# Patient Record
Sex: Female | Born: 1975 | Race: Black or African American | Hispanic: No | Marital: Married | Smoking: Former smoker
Health system: Southern US, Community
[De-identification: ages and names within clinical notes are randomized; demographics above are authoritative.]

## PROBLEM LIST (undated history)

## (undated) DIAGNOSIS — G5600 Carpal tunnel syndrome, unspecified upper limb: Secondary | ICD-10-CM

## (undated) DIAGNOSIS — J45909 Unspecified asthma, uncomplicated: Secondary | ICD-10-CM

## (undated) DIAGNOSIS — M4800 Spinal stenosis, site unspecified: Secondary | ICD-10-CM

## (undated) DIAGNOSIS — M1990 Unspecified osteoarthritis, unspecified site: Secondary | ICD-10-CM

## (undated) HISTORY — PX: TUBAL LIGATION: SHX77

## (undated) HISTORY — DX: Morbid (severe) obesity due to excess calories: E66.01

---

## 2002-05-24 ENCOUNTER — Emergency Department (HOSPITAL_COMMUNITY): Admission: EM | Admit: 2002-05-24 | Discharge: 2002-05-24 | Payer: Self-pay | Admitting: Emergency Medicine

## 2002-05-24 ENCOUNTER — Encounter: Payer: Self-pay | Admitting: Emergency Medicine

## 2002-06-26 ENCOUNTER — Emergency Department (HOSPITAL_COMMUNITY): Admission: EM | Admit: 2002-06-26 | Discharge: 2002-06-26 | Payer: Self-pay | Admitting: Emergency Medicine

## 2002-11-05 ENCOUNTER — Emergency Department (HOSPITAL_COMMUNITY): Admission: EM | Admit: 2002-11-05 | Discharge: 2002-11-05 | Payer: Self-pay | Admitting: Emergency Medicine

## 2003-01-22 ENCOUNTER — Encounter: Admission: RE | Admit: 2003-01-22 | Discharge: 2003-01-22 | Payer: Self-pay | Admitting: Sports Medicine

## 2003-01-29 ENCOUNTER — Encounter: Admission: RE | Admit: 2003-01-29 | Discharge: 2003-01-29 | Payer: Self-pay | Admitting: Sports Medicine

## 2003-02-05 ENCOUNTER — Encounter: Admission: RE | Admit: 2003-02-05 | Discharge: 2003-02-05 | Payer: Self-pay | Admitting: Sports Medicine

## 2003-05-11 ENCOUNTER — Emergency Department (HOSPITAL_COMMUNITY): Admission: EM | Admit: 2003-05-11 | Discharge: 2003-05-12 | Payer: Self-pay | Admitting: Emergency Medicine

## 2003-05-14 ENCOUNTER — Encounter: Admission: RE | Admit: 2003-05-14 | Discharge: 2003-05-14 | Payer: Self-pay | Admitting: Sports Medicine

## 2003-06-03 ENCOUNTER — Encounter: Admission: RE | Admit: 2003-06-03 | Discharge: 2003-06-03 | Payer: Self-pay | Admitting: Family Medicine

## 2003-06-29 ENCOUNTER — Encounter: Admission: RE | Admit: 2003-06-29 | Discharge: 2003-06-29 | Payer: Self-pay | Admitting: Sports Medicine

## 2004-01-04 ENCOUNTER — Emergency Department (HOSPITAL_COMMUNITY): Admission: EM | Admit: 2004-01-04 | Discharge: 2004-01-04 | Payer: Self-pay | Admitting: Emergency Medicine

## 2004-01-15 ENCOUNTER — Emergency Department (HOSPITAL_COMMUNITY): Admission: EM | Admit: 2004-01-15 | Discharge: 2004-01-15 | Payer: Self-pay | Admitting: Emergency Medicine

## 2004-01-29 ENCOUNTER — Ambulatory Visit: Payer: Self-pay | Admitting: Family Medicine

## 2004-03-15 ENCOUNTER — Ambulatory Visit: Payer: Self-pay | Admitting: Family Medicine

## 2004-03-24 ENCOUNTER — Ambulatory Visit: Payer: Self-pay | Admitting: Family Medicine

## 2004-04-01 ENCOUNTER — Ambulatory Visit: Payer: Self-pay | Admitting: Family Medicine

## 2004-04-21 ENCOUNTER — Ambulatory Visit: Payer: Self-pay | Admitting: Family Medicine

## 2004-04-28 ENCOUNTER — Ambulatory Visit: Payer: Self-pay | Admitting: Family Medicine

## 2004-05-22 ENCOUNTER — Emergency Department (HOSPITAL_COMMUNITY): Admission: EM | Admit: 2004-05-22 | Discharge: 2004-05-22 | Payer: Self-pay | Admitting: Family Medicine

## 2004-06-20 ENCOUNTER — Emergency Department (HOSPITAL_COMMUNITY): Admission: EM | Admit: 2004-06-20 | Discharge: 2004-06-20 | Payer: Self-pay | Admitting: Family Medicine

## 2004-06-21 ENCOUNTER — Emergency Department (HOSPITAL_COMMUNITY): Admission: EM | Admit: 2004-06-21 | Discharge: 2004-06-21 | Payer: Self-pay | Admitting: Family Medicine

## 2004-09-13 ENCOUNTER — Emergency Department (HOSPITAL_COMMUNITY): Admission: EM | Admit: 2004-09-13 | Discharge: 2004-09-13 | Payer: Self-pay | Admitting: Emergency Medicine

## 2004-09-24 ENCOUNTER — Emergency Department (HOSPITAL_COMMUNITY): Admission: EM | Admit: 2004-09-24 | Discharge: 2004-09-24 | Payer: Self-pay | Admitting: Emergency Medicine

## 2004-11-17 ENCOUNTER — Ambulatory Visit: Payer: Self-pay | Admitting: Family Medicine

## 2005-01-20 ENCOUNTER — Emergency Department (HOSPITAL_COMMUNITY): Admission: EM | Admit: 2005-01-20 | Discharge: 2005-01-20 | Payer: Self-pay | Admitting: Emergency Medicine

## 2005-03-28 ENCOUNTER — Ambulatory Visit: Payer: Self-pay | Admitting: Family Medicine

## 2005-05-14 ENCOUNTER — Emergency Department (HOSPITAL_COMMUNITY): Admission: EM | Admit: 2005-05-14 | Discharge: 2005-05-14 | Payer: Self-pay | Admitting: Emergency Medicine

## 2005-05-31 ENCOUNTER — Ambulatory Visit: Payer: Self-pay | Admitting: Family Medicine

## 2005-06-19 ENCOUNTER — Ambulatory Visit: Payer: Self-pay | Admitting: Sports Medicine

## 2005-06-26 ENCOUNTER — Ambulatory Visit: Payer: Self-pay | Admitting: Family Medicine

## 2005-09-01 ENCOUNTER — Emergency Department (HOSPITAL_COMMUNITY): Admission: EM | Admit: 2005-09-01 | Discharge: 2005-09-02 | Payer: Self-pay | Admitting: Emergency Medicine

## 2005-10-06 ENCOUNTER — Ambulatory Visit: Payer: Self-pay | Admitting: Family Medicine

## 2005-10-27 ENCOUNTER — Encounter (INDEPENDENT_AMBULATORY_CARE_PROVIDER_SITE_OTHER): Payer: Self-pay | Admitting: *Deleted

## 2005-10-27 LAB — CONVERTED CEMR LAB

## 2005-11-02 ENCOUNTER — Ambulatory Visit: Payer: Self-pay | Admitting: Family Medicine

## 2005-11-08 ENCOUNTER — Other Ambulatory Visit: Admission: RE | Admit: 2005-11-08 | Discharge: 2005-11-08 | Payer: Self-pay | Admitting: Family Medicine

## 2005-11-08 ENCOUNTER — Ambulatory Visit: Payer: Self-pay | Admitting: Family Medicine

## 2005-12-18 ENCOUNTER — Ambulatory Visit: Payer: Self-pay | Admitting: Sports Medicine

## 2006-04-19 DIAGNOSIS — E669 Obesity, unspecified: Secondary | ICD-10-CM

## 2006-04-19 DIAGNOSIS — K625 Hemorrhage of anus and rectum: Secondary | ICD-10-CM

## 2006-04-19 DIAGNOSIS — D259 Leiomyoma of uterus, unspecified: Secondary | ICD-10-CM | POA: Insufficient documentation

## 2006-04-20 ENCOUNTER — Encounter (INDEPENDENT_AMBULATORY_CARE_PROVIDER_SITE_OTHER): Payer: Self-pay | Admitting: *Deleted

## 2006-06-08 ENCOUNTER — Emergency Department (HOSPITAL_COMMUNITY): Admission: EM | Admit: 2006-06-08 | Discharge: 2006-06-09 | Payer: Self-pay | Admitting: Emergency Medicine

## 2010-03-13 ENCOUNTER — Encounter: Payer: Self-pay | Admitting: Sports Medicine

## 2017-03-07 ENCOUNTER — Emergency Department (HOSPITAL_COMMUNITY)
Admission: EM | Admit: 2017-03-07 | Discharge: 2017-03-07 | Disposition: A | Discharge status: Home / Self Care | Payer: Medicaid Other | Attending: Emergency Medicine | Admitting: Emergency Medicine

## 2017-03-07 ENCOUNTER — Encounter (HOSPITAL_COMMUNITY): Payer: Self-pay

## 2017-03-07 ENCOUNTER — Other Ambulatory Visit: Payer: Self-pay

## 2017-03-07 ENCOUNTER — Emergency Department (HOSPITAL_COMMUNITY): Payer: Medicaid Other

## 2017-03-07 DIAGNOSIS — Z91040 Latex allergy status: Secondary | ICD-10-CM | POA: Insufficient documentation

## 2017-03-07 DIAGNOSIS — Z79899 Other long term (current) drug therapy: Secondary | ICD-10-CM | POA: Diagnosis not present

## 2017-03-07 DIAGNOSIS — R300 Dysuria: Secondary | ICD-10-CM | POA: Diagnosis present

## 2017-03-07 DIAGNOSIS — N739 Female pelvic inflammatory disease, unspecified: Secondary | ICD-10-CM | POA: Diagnosis not present

## 2017-03-07 DIAGNOSIS — N730 Acute parametritis and pelvic cellulitis: Secondary | ICD-10-CM

## 2017-03-07 HISTORY — DX: Unspecified osteoarthritis, unspecified site: M19.90

## 2017-03-07 HISTORY — DX: Carpal tunnel syndrome, unspecified upper limb: G56.00

## 2017-03-07 LAB — COMPREHENSIVE METABOLIC PANEL
ALBUMIN: 3.5 g/dL (ref 3.5–5.0)
ALT: 15 U/L (ref 14–54)
AST: 21 U/L (ref 15–41)
Alkaline Phosphatase: 48 U/L (ref 38–126)
Anion gap: 9 (ref 5–15)
BILIRUBIN TOTAL: 0.5 mg/dL (ref 0.3–1.2)
BUN: 7 mg/dL (ref 6–20)
CHLORIDE: 105 mmol/L (ref 101–111)
CO2: 26 mmol/L (ref 22–32)
CREATININE: 0.76 mg/dL (ref 0.44–1.00)
Calcium: 8.8 mg/dL — ABNORMAL LOW (ref 8.9–10.3)
GFR calc Af Amer: >60 mL/min (ref >60)
GLUCOSE: 104 mg/dL — AB (ref 65–99)
POTASSIUM: 3.9 mmol/L (ref 3.5–5.1)
Sodium: 140 mmol/L (ref 135–145)
TOTAL PROTEIN: 6.7 g/dL (ref 6.5–8.1)

## 2017-03-07 LAB — URINALYSIS, ROUTINE W REFLEX MICROSCOPIC
Bilirubin Urine: NEGATIVE
Glucose, UA: NEGATIVE mg/dL
Ketones, ur: NEGATIVE mg/dL
Leukocytes, UA: NEGATIVE
Nitrite: NEGATIVE
Protein, ur: NEGATIVE mg/dL
Specific Gravity, Urine: 1.003 — ABNORMAL LOW (ref 1.005–1.030)
pH: 6 (ref 5.0–8.0)

## 2017-03-07 LAB — CBC WITH DIFFERENTIAL/PLATELET
Basophils Absolute: 0 10*3/uL (ref 0.0–0.1)
Basophils Relative: 0 %
Eosinophils Absolute: 0.5 10*3/uL (ref 0.0–0.7)
Eosinophils Relative: 5 %
HCT: 31.7 % — ABNORMAL LOW (ref 36.0–46.0)
Hemoglobin: 9.9 g/dL — ABNORMAL LOW (ref 12.0–15.0)
Lymphocytes Relative: 40 %
Lymphs Abs: 3.4 10*3/uL (ref 0.7–4.0)
MCH: 25.2 pg — ABNORMAL LOW (ref 26.0–34.0)
MCHC: 31.2 g/dL (ref 30.0–36.0)
MCV: 80.7 fL (ref 78.0–100.0)
Monocytes Absolute: 0.6 10*3/uL (ref 0.1–1.0)
Monocytes Relative: 7 %
Neutro Abs: 4 10*3/uL (ref 1.7–7.7)
Neutrophils Relative %: 48 %
Platelets: 470 10*3/uL — ABNORMAL HIGH (ref 150–400)
RBC: 3.93 MIL/uL (ref 3.87–5.11)
RDW: 15.2 % (ref 11.5–15.5)
WBC: 8.5 10*3/uL (ref 4.0–10.5)

## 2017-03-07 LAB — WET PREP, GENITAL
Clue Cells Wet Prep HPF POC: NONE SEEN
Sperm: NONE SEEN
Trich, Wet Prep: NONE SEEN
Yeast Wet Prep HPF POC: NONE SEEN

## 2017-03-07 LAB — GC/CHLAMYDIA PROBE AMP (~~LOC~~) NOT AT ARMC
Chlamydia: NEGATIVE
Neisseria Gonorrhea: NEGATIVE

## 2017-03-07 MED ORDER — OXYCODONE-ACETAMINOPHEN 5-325 MG PO TABS
2.0000 | ORAL_TABLET | Freq: Once | ORAL | Status: AC
  Administered 2017-03-07: 2 via ORAL
  Filled 2017-03-07: qty 2

## 2017-03-07 MED ORDER — DOXYCYCLINE HYCLATE 100 MG PO CAPS: 100.0000 mg | ORAL_CAPSULE | Freq: Two times a day (BID) | ORAL | 0 refills | Status: DC

## 2017-03-07 MED ORDER — DIPHENHYDRAMINE HCL 50 MG/ML IJ SOLN
25.0000 mg | Freq: Once | INTRAMUSCULAR | Status: AC
  Administered 2017-03-07: 25 mg via INTRAVENOUS
  Filled 2017-03-07: qty 1

## 2017-03-07 MED ORDER — IOPAMIDOL (ISOVUE-300) INJECTION 61%
INTRAVENOUS | Status: AC
  Administered 2017-03-07: 100 mL
  Filled 2017-03-07: qty 100

## 2017-03-07 MED ORDER — MORPHINE SULFATE (PF) 4 MG/ML IV SOLN
4.0000 mg | Freq: Once | INTRAVENOUS | Status: AC
  Administered 2017-03-07: 4 mg via INTRAVENOUS
  Filled 2017-03-07: qty 1

## 2017-03-07 MED ORDER — CEFTRIAXONE SODIUM 1 G IJ SOLR
1.0000 g | INTRAMUSCULAR | Status: DC
  Administered 2017-03-07: 1 g via INTRAVENOUS
  Filled 2017-03-07: qty 10

## 2017-03-07 MED ORDER — OXYCODONE-ACETAMINOPHEN 5-325 MG PO TABS: 1.0000 | ORAL_TABLET | ORAL | 0 refills | Status: DC | PRN

## 2017-03-07 MED ORDER — OXYCODONE-ACETAMINOPHEN 5-325 MG PO TABS
1.0000 | ORAL_TABLET | Freq: Once | ORAL | Status: AC
  Administered 2017-03-07: 1 via ORAL
  Filled 2017-03-07: qty 1

## 2017-03-07 MED ORDER — SODIUM CHLORIDE 0.9 % IV SOLN
INTRAVENOUS | Status: DC
  Administered 2017-03-07: 10:00:00 via INTRAVENOUS

## 2017-03-07 NOTE — ED Provider Notes (Signed)
[facility] EMERGENCY DEPARTMENT Provider Note   CSN: [id number] Arrival date & time: 03/07/17  0431     History   Chief Complaint Chief Complaint  Patient presents with  . Recurrent UTI    HPI Michele Trevino is a 42 y.o. female.  42 year old female presents with dysuria times several days.  Recently treated for UTI and just finished her antibiotics.  Denies any fever, flank pain, vomiting.  Some nausea.  Some vaginal discharge without bleeding.  Denies any vaginal bleeding.  Symptoms persistent and may swell worse with urination.  Nothing makes them better      Past Medical History:  Diagnosis Date  . Arthritis   . Carpal tunnel syndrome     Patient Active Problem List   Diagnosis Date Noted  . 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 OBESITY, NOS 04/19/2006  . BLEEDING, RECTAL 04/19/2006    Past Surgical History:  Procedure Laterality Date  . CESAREAN SECTION      OB History    No data available       Home Medications    Prior to Admission medications   Medication Sig Start Date End Date Taking? Authorizing Provider  albuterol (PROVENTIL HFA;VENTOLIN HFA) 108 (90 Base) MCG/ACT inhaler Inhale 1-2 puffs into the lungs every 6 (six) hours as needed for wheezing or shortness of breath.   Yes [provider]    Family History No family history on file.  Social History Social History   Tobacco Use  . Smoking status: Never Smoker  . Smokeless tobacco: Never Used  Substance Use Topics  . Alcohol use: No    Frequency: Never  . Drug use: No     Allergies   Latex   Review of Systems Review of Systems  All other systems reviewed and are negative.    Physical Exam Updated Vital Signs BP 128/62   Pulse 73   Temp 97.9 F (36.6 C) (Oral)   Resp 20   Ht 1.626 m (5\' 4" )   Wt 90.3 kg (199 lb)   LMP 03/01/2017   SpO2 100%   BMI 34.16 kg/m   Physical Exam  Constitutional: She is oriented to person, place, and time. She  appears well-developed and well-nourished.  Non-toxic appearance. No distress.  HENT:  Head: Normocephalic and atraumatic.  Eyes: Conjunctivae, EOM and lids are normal. Pupils are equal, round, and reactive to light.  Neck: Normal range of motion. Neck supple. No tracheal deviation present. No thyroid mass present.  Cardiovascular: Normal rate, regular rhythm and normal heart sounds. Exam reveals no gallop.  No murmur heard. Pulmonary/Chest: Effort normal and breath sounds normal. No stridor. No respiratory distress. She has no decreased breath sounds. She has no wheezes. She has no rhonchi. She has no rales.  Abdominal: Soft. Normal appearance and bowel sounds are normal. She exhibits no distension. There is no tenderness. There is no rebound and no CVA tenderness.  Genitourinary: No erythema or bleeding in the vagina. No signs of injury around the vagina. Vaginal discharge found.  Musculoskeletal: Normal range of motion. She exhibits no edema or tenderness.  Neurological: She is alert and oriented to person, place, and time. She has normal strength. No cranial nerve deficit or sensory deficit. GCS eye subscore is 4. GCS verbal subscore is 5. GCS motor subscore is 6.  Skin: Skin is warm and dry. No abrasion and no rash noted.  Psychiatric: She has a normal mood and affect. Her speech is normal and  behavior is normal.  Nursing note and vitals reviewed.    ED Treatments / Results  Labs (all labs ordered are listed, but only abnormal results are displayed) Labs Reviewed  URINALYSIS, ROUTINE W REFLEX MICROSCOPIC - Abnormal; Notable for the following components:      Result Value   Color, Urine STRAW (*)    Specific Gravity, Urine 1.003 (*)    Hgb urine dipstick SMALL (*)    Bacteria, UA RARE (*)    Squamous Epithelial / LPF 0-5 (*)    All other components within normal limits  WET PREP, GENITAL  GC/CHLAMYDIA PROBE AMP ([place]) NOT AT [facility]    EKG  EKG Interpretation None        Radiology No results found.  Procedures Procedures (including critical care time)  Medications Ordered in ED Medications - No data to display   Initial Impression / Assessment and Plan / ED Course  I have reviewed the triage vital signs and the nursing notes.  Pertinent labs & imaging results that were available during my care of the patient were reviewed by me and considered in my medical decision making (see chart for details).     Abdominal CT negative for intra-abdominal abscess.  Suspect patient may have PID and will be treated for that.  Final Clinical Impressions(s) / ED Diagnoses   Final diagnoses:  None    ED Discharge Orders    None       Lacretia Leigh, MD 03/07/17 1340

## 2017-03-07 NOTE — ED Notes (Signed)
Patient c/o lower back pain states she was dx. With UTI on 1/2 and just finished her antibiotics this past Sat. States she is still having pain and burning with urination.

## 2017-03-07 NOTE — ED Notes (Signed)
Patient transported to CT 

## 2017-03-07 NOTE — ED Triage Notes (Signed)
Pt states that she was recently treated for UTi, finished antibiotics on Sat, pain has not went away, denies fevers.

## 2017-03-07 NOTE — ED Notes (Signed)
CT informed that pt has had a tubal ligation and we will not be performing a urine pregnancy test.  Informed them pt is ready for CT.  Pt will sign statement that she is not pregnant.

## 2017-05-13 ENCOUNTER — Other Ambulatory Visit: Payer: Self-pay

## 2017-05-13 ENCOUNTER — Emergency Department (HOSPITAL_COMMUNITY)
Admission: EM | Admit: 2017-05-13 | Discharge: 2017-05-14 | Disposition: A | Discharge status: Home / Self Care | Payer: Medicaid Other | Attending: Emergency Medicine | Admitting: Emergency Medicine

## 2017-05-13 ENCOUNTER — Encounter (HOSPITAL_COMMUNITY): Payer: Self-pay

## 2017-05-13 DIAGNOSIS — R350 Frequency of micturition: Secondary | ICD-10-CM | POA: Diagnosis present

## 2017-05-13 DIAGNOSIS — N898 Other specified noninflammatory disorders of vagina: Secondary | ICD-10-CM

## 2017-05-13 DIAGNOSIS — N3001 Acute cystitis with hematuria: Secondary | ICD-10-CM | POA: Insufficient documentation

## 2017-05-13 LAB — URINALYSIS, ROUTINE W REFLEX MICROSCOPIC
BILIRUBIN URINE: NEGATIVE
Glucose, UA: NEGATIVE mg/dL
Ketones, ur: NEGATIVE mg/dL
NITRITE: NEGATIVE
PROTEIN: NEGATIVE mg/dL
Specific Gravity, Urine: 1.024 (ref 1.005–1.030)
pH: 5 (ref 5.0–8.0)

## 2017-05-13 LAB — BASIC METABOLIC PANEL
ANION GAP: 7 (ref 5–15)
BUN: 14 mg/dL (ref 6–20)
CALCIUM: 8.8 mg/dL — AB (ref 8.9–10.3)
CHLORIDE: 107 mmol/L (ref 101–111)
CO2: 25 mmol/L (ref 22–32)
Creatinine, Ser: 0.8 mg/dL (ref 0.44–1.00)
GFR calc non Af Amer: >60 mL/min (ref >60)
Glucose, Bld: 105 mg/dL — ABNORMAL HIGH (ref 65–99)
Potassium: 3.8 mmol/L (ref 3.5–5.1)
Sodium: 139 mmol/L (ref 135–145)

## 2017-05-13 LAB — CBC WITH DIFFERENTIAL/PLATELET
BASOS PCT: 0 %
Basophils Absolute: 0 10*3/uL (ref 0.0–0.1)
Eosinophils Absolute: 0.4 10*3/uL (ref 0.0–0.7)
Eosinophils Relative: 3 %
HEMATOCRIT: 33.1 % — AB (ref 36.0–46.0)
HEMOGLOBIN: 10.5 g/dL — AB (ref 12.0–15.0)
Lymphocytes Relative: 37 %
Lymphs Abs: 4 10*3/uL (ref 0.7–4.0)
MCH: 26.4 pg (ref 26.0–34.0)
MCHC: 31.7 g/dL (ref 30.0–36.0)
MCV: 83.2 fL (ref 78.0–100.0)
MONOS PCT: 6 %
Monocytes Absolute: 0.6 10*3/uL (ref 0.1–1.0)
NEUTROS ABS: 5.8 10*3/uL (ref 1.7–7.7)
NEUTROS PCT: 54 %
Platelets: 429 10*3/uL — ABNORMAL HIGH (ref 150–400)
RBC: 3.98 MIL/uL (ref 3.87–5.11)
RDW: 16 % — ABNORMAL HIGH (ref 11.5–15.5)
WBC: 10.8 10*3/uL — ABNORMAL HIGH (ref 4.0–10.5)

## 2017-05-13 LAB — PREGNANCY, URINE: PREG TEST UR: NEGATIVE

## 2017-05-13 LAB — WET PREP, GENITAL
Clue Cells Wet Prep HPF POC: NONE SEEN
Sperm: NONE SEEN
Trich, Wet Prep: NONE SEEN

## 2017-05-13 MED ORDER — FENTANYL CITRATE (PF) 100 MCG/2ML IJ SOLN
100.0000 ug | Freq: Once | INTRAMUSCULAR | Status: AC
  Administered 2017-05-13: 100 ug via INTRAVENOUS
  Filled 2017-05-13: qty 2

## 2017-05-13 NOTE — ED Notes (Signed)
Pelvic cart at bedside. 

## 2017-05-13 NOTE — ED Provider Notes (Signed)
[facility] DEPT Provider Note   CSN: [id number] Arrival date & time: 05/13/17  1954     History   Chief Complaint Chief Complaint  Patient presents with  . Urinary Frequency    HPI Michele Trevino is a 42 y.o. female who presents for evaluation of 4 days of increased urinary frequency, dysuria, hematuria.  Patient also reports that today she started having swelling.  Patient reports that when symptoms began 4 days ago, she took a leftover antibiotic that she had from a previous UTI.  Patient states that improved the symptoms of her urinary discomfort.  Patient reports a day, she started noticing some vaginal irritation, felt like she is having some clitoral swelling. She states that she felt as if tehre was erythema, swelling, and warmth to the clitoris. Patient reports she was last currently sexually active 6 days ago.  She states that they do not use any protection or any other medication.  Patient denies any fever, difficulty tolerating p.o., nausea/vomiting, abdominal pain, vaginal bleeding.  The history is provided by the patient.    Past Medical History:  Diagnosis Date  . Arthritis   . Carpal tunnel syndrome     Patient Active Problem List   Diagnosis Date Noted  . 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 OBESITY, NOS 04/19/2006  . BLEEDING, RECTAL 04/19/2006    Past Surgical History:  Procedure Laterality Date  . CESAREAN SECTION       OB History   None      Home Medications    Prior to Admission medications   Medication Sig Start Date End Date Taking? Authorizing Provider  albuterol (PROVENTIL HFA;VENTOLIN HFA) 108 (90 Base) MCG/ACT inhaler Inhale 1-2 puffs into the lungs every 6 (six) hours as needed for wheezing or shortness of breath.   Yes [provider]  amoxicillin-clavulanate (AUGMENTIN) 875-125 MG tablet Take 1 tablet by mouth 2 (two) times daily.   Yes [provider]  miconazole (MONISTAT 1  COMBINATION PACK) kit Place 1 each vaginally once.   Yes [provider]  cephALEXin (KEFLEX) 500 MG capsule Take 1 capsule (500 mg total) by mouth 4 (four) times daily. 05/14/17   Volanda Napoleon, PA-C  doxycycline (VIBRAMYCIN) 100 MG capsule Take 1 capsule (100 mg total) by mouth 2 (two) times daily for 7 days. 05/14/17 05/21/17  Volanda Napoleon, PA-C  fluconazole (DIFLUCAN) 150 MG tablet Take 1 tablet (150 mg total) by mouth daily for 1 day. 05/14/17 05/15/17  Volanda Napoleon, PA-C  HYDROcodone-acetaminophen (NORCO/VICODIN) 5-325 MG tablet Take 1-2 tablets by mouth every 6 (six) hours as needed. 05/14/17   Volanda Napoleon, PA-C  oxyCODONE-acetaminophen (PERCOCET/ROXICET) 5-325 MG tablet Take 1-2 tablets by mouth every 4 (four) hours as needed for severe pain. Patient not taking: Reported on 05/14/2017 03/07/17   Lacretia Leigh, MD    Family History History reviewed. No pertinent family history.  Social History Social History   Tobacco Use  . Smoking status: Never Smoker  . Smokeless tobacco: Never Used  Substance Use Topics  . Alcohol use: No    Frequency: Never  . Drug use: No     Allergies   Latex   Review of Systems Review of Systems  Constitutional: Negative for fever.  Respiratory: Negative for cough and shortness of breath.   Cardiovascular: Negative for chest pain.  Gastrointestinal: Negative for abdominal pain, nausea and vomiting.  Genitourinary: Positive for dysuria, frequency, hematuria and vaginal pain. Negative for vaginal  bleeding.     Physical Exam Updated Vital Signs BP 114/71 (BP Location: Right Arm)   Pulse 72   Temp 98.8 F (37.1 C) (Oral)   Resp 17   Ht _0  (1.549 m)   Wt 90.3 kg (199 lb)   LMP 04/29/2017 Comment: negative urine oregnancy test 05/13/17  SpO2 99%   BMI 37.60 kg/m   Physical Exam  Constitutional: She is oriented to person, place, and time. She appears well-developed and well-nourished.  HENT:  Head: Normocephalic  and atraumatic.  Mouth/Throat: Oropharynx is clear and moist and mucous membranes are normal.  Eyes: Pupils are equal, round, and reactive to light. Conjunctivae, EOM and lids are normal.  Neck: Full passive range of motion without pain.  Cardiovascular: Normal rate, regular rhythm, normal heart sounds and normal pulses. Exam reveals no gallop and no friction rub.  No murmur heard. Pulmonary/Chest: Effort normal and breath sounds normal.  Abdominal: Soft. Normal appearance. There is no tenderness. There is no rigidity, no guarding and no CVA tenderness.  Abdomen is soft, non-distended. Non-tender. No CVA tenderness bilaterally  Genitourinary: Vagina normal and uterus normal. Cervix exhibits discharge. Cervix exhibits no motion tenderness and no friability. Right adnexum displays no mass and no tenderness. Left adnexum displays no mass and no tenderness.  Genitourinary Comments: The exam was performed with a chaperone present. Normal external female genitalia. No lesions, rash, or sores. Patient had pain with palpation of the clitoris. No warmth, erythema. No evidence of bartholin's abscess. Patient with significant pain on the pelvic exam. There was bloody/purulent drainage noted at the cervix. Cervix is slightly erythematous. On manual exam, patient was able to tolerate pelvic exam after initial entry but does report significant pain. No true CMT. No adnexal mass, tenderness bilaterally. Patient seems to have the most pain to the superficial clitoris and labia minora.   Musculoskeletal: Normal range of motion.  Neurological: She is alert and oriented to person, place, and time.  Skin: Skin is warm and dry. Capillary refill takes less than 2 seconds.  Psychiatric: She has a normal mood and affect. Her speech is normal.  Nursing note and vitals reviewed.    ED Treatments / Results  Labs (all labs ordered are listed, but only abnormal results are displayed) Labs Reviewed  WET PREP, GENITAL -  Abnormal; Notable for the following components:      Result Value   Yeast Wet Prep HPF POC PRESENT (*)    WBC, Wet Prep HPF POC MODERATE (*)    All other components within normal limits  URINALYSIS, ROUTINE W REFLEX MICROSCOPIC - Abnormal; Notable for the following components:   APPearance HAZY (*)    Hgb urine dipstick SMALL (*)    Leukocytes, UA LARGE (*)    Bacteria, UA RARE (*)    Squamous Epithelial / LPF 0-5 (*)    All other components within normal limits  BASIC METABOLIC PANEL - Abnormal; Notable for the following components:   Glucose, Bld 105 (*)    Calcium 8.8 (*)    All other components within normal limits  CBC WITH DIFFERENTIAL/PLATELET - Abnormal; Notable for the following components:   WBC 10.8 (*)    Hemoglobin 10.5 (*)    HCT 33.1 (*)    RDW 16.0 (*)    Platelets 429 (*)    All other components within normal limits  PREGNANCY, URINE  GC/CHLAMYDIA PROBE AMP ([place]) NOT AT [facility]    EKG None  Radiology Ct Abdomen Pelvis  W Contrast  Result Date: 05/14/2017 CLINICAL DATA:  42 year old female with dysuria. EXAM: CT ABDOMEN AND PELVIS WITH CONTRAST TECHNIQUE: Multidetector CT imaging of the abdomen and pelvis was performed using the standard protocol following bolus administration of intravenous contrast. CONTRAST:  139m ISOVUE-300 IOPAMIDOL (ISOVUE-300) INJECTION 61% COMPARISON:  CT of the abdomen pelvis dated 03/07/2017. FINDINGS: Lower chest: Minimal focal area of probable atelectasis/scarring at the right lung base. The visualized lung bases are otherwise clear. No intra-abdominal free air or free fluid. Hepatobiliary: No focal liver abnormality is seen. No gallstones, gallbladder wall thickening, or biliary dilatation. Pancreas: Unremarkable. No pancreatic ductal dilatation or surrounding inflammatory changes. Spleen: Normal in size without focal abnormality. Adrenals/Urinary Tract: Adrenal glands are unremarkable. Kidneys are normal, without renal calculi,  focal lesion, or hydronephrosis. Bladder is unremarkable. Stomach/Bowel: Stomach is within normal limits. Appendix appears normal. No evidence of bowel wall thickening, distention, or inflammatory changes. Vascular/Lymphatic: No significant vascular findings are present. No enlarged abdominal or pelvic lymph nodes. Reproductive: The uterus is anteverted and retroflexed. There is abutment of the anterior uterus to the anterior pelvic wall consistent with adhesions. The ovaries are grossly unremarkable as visualized. Other: Anterior pelvic wall C-section scar. Musculoskeletal: No acute or significant osseous findings. IMPRESSION: 1. No acute intra-abdominal or pelvic pathology. 2. Postsurgical changes of prior C-section with adhesion of the uterus to the anterior pelvic wall. Electronically Signed   By: AAnner CreteM.D.   On: 05/14/2017 01:04    Procedures Procedures (including critical care time)  Medications Ordered in ED Medications  iopamidol (ISOVUE-300) 61 % injection (has no administration in time range)  sodium chloride 0.9 % injection (has no administration in time range)  fentaNYL (SUBLIMAZE) injection 100 mcg (100 mcg Intravenous Given 05/13/17 2333)  iopamidol (ISOVUE-300) 61 % injection 100 mL (100 mLs Intravenous Contrast Given 05/14/17 0024)  HYDROcodone-acetaminophen (NORCO/VICODIN) 5-325 MG per tablet 1 tablet (1 tablet Oral Given 05/14/17 0132)  cefTRIAXone (ROCEPHIN) injection 250 mg (250 mg Intramuscular Given 05/14/17 0133)  lidocaine (XYLOCAINE) 1 % (with pres) injection (0.9 mLs  Given 05/14/17 0134)     Initial Impression / Assessment and Plan / ED Course  I have reviewed the triage vital signs and the nursing notes.  Pertinent labs & imaging results that were available during my care of the patient were reviewed by me and considered in my medical decision making (see chart for details).     42y.o. F who presents for evaluation of urinary complaints and vaginal  discharge. Initially started with urinary complaints 4 days ago. Today had some vaginal irritation and reports pain, swelling to clitoris. No vaginal bleeding, vaginal discharge. No abdominal pain, nausea/vomiting. Patient is afebrile, non-toxic appearing, sitting comfortably on examination table. Vital signs reviewed and stable. Exam shows normal abdominal exam. No CVA tenderness. On GU exam, patient has not rashes or lesions. Patient with significant TTP to the clitoris. No warmth, swelling,e erythema. No evidence of clitoral abscess. No evidence of bartholin's abscess. Patient with some bloody/purulent discharge on GU exam. Cervix is slightly erythematous but no friability. No adnexal mass/tenderness. Patient states she is not concerned about STD exposure as she is currently only sexually active with here husband. Given significant pain and discharge, will plan for CT abd/pelvis for evaluation of possible intra-abdominal abscess that is tracking. Analgesics provided in the department.   BMP unremarkable. CBC shows leukocytosis, slight anemia. Wet prep shows yeast present. Urine pregnancy negative. UA shows large leukocytes, TNTC WBC. CT abd/pelv shows no  acute abnormalities.   Discussed results with patient. She reports improvement in pain after analgesics. Will plan to provide abx for UTI. Additionally, given conerns of significant pelvic pain, will go ahead and treat. Instructed patient to follow up with OB/GYN. Patient had ample opportunity for questions and discussion. All patient's questions were answered with full understanding. Strict return precautions discussed. Patient expresses understanding and agreement to plan.    Final Clinical Impressions(s) / ED Diagnoses   Final diagnoses:  Acute cystitis with hematuria  Vaginal irritation  Vaginal discharge    ED Discharge Orders        Ordered    doxycycline (VIBRAMYCIN) 100 MG capsule  2 times daily     05/14/17 0120    cephALEXin (KEFLEX)  500 MG capsule  4 times daily     05/14/17 0120    HYDROcodone-acetaminophen (NORCO/VICODIN) 5-325 MG tablet  Every 6 hours PRN     05/14/17 0120    fluconazole (DIFLUCAN) 150 MG tablet  Daily     05/14/17 0120       Volanda Napoleon, PA-C 05/14/17 0356    Lacretia Leigh, MD 05/14/17 1701

## 2017-05-13 NOTE — ED Triage Notes (Signed)
Pt arrives today c/o urinary frequency, urgency, and hematuria starting 4 days ago. Pt began taking a left over abx  from a previous infection.  Vaginal itching started yesterday, used a monistat for yeast infection, but began to experience clitoral swelling upon waking today. Reports some nausea.

## 2017-05-14 ENCOUNTER — Emergency Department (HOSPITAL_COMMUNITY): Payer: Medicaid Other

## 2017-05-14 ENCOUNTER — Encounter (HOSPITAL_COMMUNITY): Payer: Self-pay

## 2017-05-14 LAB — GC/CHLAMYDIA PROBE AMP (~~LOC~~) NOT AT ARMC
CHLAMYDIA, DNA PROBE: NEGATIVE
NEISSERIA GONORRHEA: NEGATIVE

## 2017-05-14 MED ORDER — IOPAMIDOL (ISOVUE-300) INJECTION 61%
INTRAVENOUS | Status: AC
  Filled 2017-05-14: qty 100

## 2017-05-14 MED ORDER — FLUCONAZOLE 150 MG PO TABS: 150.0000 mg | ORAL_TABLET | Freq: Every day | ORAL | 0 refills | Status: AC

## 2017-05-14 MED ORDER — CEFTRIAXONE SODIUM 250 MG IJ SOLR
250.0000 mg | Freq: Once | INTRAMUSCULAR | Status: AC
  Administered 2017-05-14: 250 mg via INTRAMUSCULAR
  Filled 2017-05-14: qty 250

## 2017-05-14 MED ORDER — LIDOCAINE HCL 1 % IJ SOLN
INTRAMUSCULAR | Status: AC
  Administered 2017-05-14: 0.9 mL
  Filled 2017-05-14: qty 20

## 2017-05-14 MED ORDER — HYDROCODONE-ACETAMINOPHEN 5-325 MG PO TABS
1.0000 | ORAL_TABLET | Freq: Once | ORAL | Status: AC
  Administered 2017-05-14: 1 via ORAL
  Filled 2017-05-14: qty 1

## 2017-05-14 MED ORDER — CEPHALEXIN 500 MG PO CAPS: 500.0000 mg | ORAL_CAPSULE | Freq: Four times a day (QID) | ORAL | 0 refills | Status: DC

## 2017-05-14 MED ORDER — DOXYCYCLINE HYCLATE 100 MG PO CAPS: 100.0000 mg | ORAL_CAPSULE | Freq: Two times a day (BID) | ORAL | 0 refills | Status: AC

## 2017-05-14 MED ORDER — IOPAMIDOL (ISOVUE-300) INJECTION 61%
100.0000 mL | Freq: Once | INTRAVENOUS | Status: AC | PRN
  Administered 2017-05-14: 100 mL via INTRAVENOUS

## 2017-05-14 MED ORDER — DOXYCYCLINE HYCLATE 100 MG PO TABS: 100.0000 mg | ORAL_TABLET | Freq: Once | ORAL | Status: DC

## 2017-05-14 MED ORDER — SODIUM CHLORIDE 0.9 % IJ SOLN
INTRAMUSCULAR | Status: AC
  Filled 2017-05-14: qty 50

## 2017-05-14 MED ORDER — CEPHALEXIN 500 MG PO CAPS: 500.0000 mg | ORAL_CAPSULE | Freq: Once | ORAL | Status: DC

## 2017-05-14 MED ORDER — HYDROCODONE-ACETAMINOPHEN 5-325 MG PO TABS: 1.0000 | ORAL_TABLET | Freq: Four times a day (QID) | ORAL | 0 refills | Status: DC | PRN

## 2017-05-14 NOTE — Discharge Instructions (Addendum)
Take antibiotics as directed. Please take all of your antibiotics until finished.  You can take Tylenol or Ibuprofen as directed for pain. You can alternate Tylenol and Ibuprofen every 4 hours. If you take Tylenol at 1pm, then you can take Ibuprofen at 5pm. Then you can take Tylenol again at 9pm. You can take the pain   The test results from the pelvic exam will take 2-3 days to return. If there is an abnormal result, you will be notified. If you do not hear anything, that means the results were negative. You can also log on MyChart to see the results.    Do not have sexual intercourse until you finish the antibiotics.   As we discussed, you need to follow-up with referred [facility] for further evaluation.  Return to the emergency department for any fever, worsening pain, vaginal discharge, vaginal bleeding, abdominal pain, difficulty eating or any other worsening or concerning symptoms.

## 2017-06-28 ENCOUNTER — Other Ambulatory Visit (HOSPITAL_COMMUNITY)
Admission: RE | Admit: 2017-06-28 | Discharge: 2017-06-28 | Disposition: A | Discharge status: Home / Self Care | Payer: Medicaid Other | Source: Ambulatory Visit | Attending: Obstetrics | Admitting: Obstetrics

## 2017-06-28 ENCOUNTER — Encounter: Payer: Self-pay | Admitting: Obstetrics

## 2017-06-28 ENCOUNTER — Ambulatory Visit (INDEPENDENT_AMBULATORY_CARE_PROVIDER_SITE_OTHER): Payer: Medicaid Other | Admitting: Obstetrics

## 2017-06-28 VITALS — BP 146/97 | HR 73 | Temp 98.2°F | Ht 61.0 in | Wt 201.8 lb

## 2017-06-28 DIAGNOSIS — R87810 Cervical high risk human papillomavirus (HPV) DNA test positive: Secondary | ICD-10-CM | POA: Insufficient documentation

## 2017-06-28 DIAGNOSIS — Z124 Encounter for screening for malignant neoplasm of cervix: Secondary | ICD-10-CM | POA: Insufficient documentation

## 2017-06-28 DIAGNOSIS — Z1239 Encounter for other screening for malignant neoplasm of breast: Secondary | ICD-10-CM

## 2017-06-28 DIAGNOSIS — N760 Acute vaginitis: Secondary | ICD-10-CM | POA: Insufficient documentation

## 2017-06-28 DIAGNOSIS — N390 Urinary tract infection, site not specified: Secondary | ICD-10-CM

## 2017-06-28 DIAGNOSIS — B9689 Other specified bacterial agents as the cause of diseases classified elsewhere: Secondary | ICD-10-CM | POA: Insufficient documentation

## 2017-06-28 DIAGNOSIS — Z01419 Encounter for gynecological examination (general) (routine) without abnormal findings: Secondary | ICD-10-CM

## 2017-06-28 DIAGNOSIS — Z0000 Encounter for general adult medical examination without abnormal findings: Secondary | ICD-10-CM

## 2017-06-28 DIAGNOSIS — N898 Other specified noninflammatory disorders of vagina: Secondary | ICD-10-CM | POA: Diagnosis present

## 2017-06-28 LAB — POCT URINALYSIS DIPSTICK
BILIRUBIN UA: NEGATIVE
GLUCOSE UA: NEGATIVE
Ketones, UA: NEGATIVE
Nitrite, UA: NEGATIVE
Protein, UA: NEGATIVE
SPEC GRAV UA: 1.01 (ref 1.010–1.025)
Urobilinogen, UA: 2 E.U./dL — AB
pH, UA: 7.5 (ref 5.0–8.0)

## 2017-06-28 MED ORDER — NITROFURANTOIN MONOHYD MACRO 100 MG PO CAPS: 100.0000 mg | ORAL_CAPSULE | Freq: Two times a day (BID) | ORAL | 5 refills | Status: DC

## 2017-06-28 NOTE — Progress Notes (Signed)
Subjective:        Michele Trevino is a 42 y.o. female here for a routine exam.  Current complaints: Frequent UTI's since marriage in January 2019.  UTI's seem to occur after intercourse.    Personal health questionnaire:  Is patient Ashkenazi Jewish, have a family history of breast and/or ovarian cancer: no Is there a family history of uterine cancer diagnosed at age < 68, gastrointestinal cancer, urinary tract cancer, family member who is a Field seismologist syndrome-associated carrier: no Is the patient overweight and hypertensive, family history of diabetes, personal history of gestational diabetes, preeclampsia or PCOS: no Is patient over 15, have PCOS,  family history of premature CHD under age 9, diabetes, smoke, have hypertension or peripheral artery disease:  no At any time, has a partner hit, kicked or otherwise hurt or frightened you?: no Over the past 2 weeks, have you felt down, depressed or hopeless?: no Over the past 2 weeks, have you felt little interest or pleasure in doing things?:no   Gynecologic History Patient's last menstrual period was 06/12/2017 (exact date). Contraception: Tubal Ligation Last Pap: unknown. Results were: normal Last mammogram: unknown. Results were: unknown  Obstetric History OB History  Gravida Para Term Preterm AB Living  4         4  SAB TAB Ectopic Multiple Live Births               # Outcome Date GA Lbr Len/2nd Weight Sex Delivery Anes PTL Lv  4 Gravida           3 Gravida           2 Gravida           1 [place]             Past Medical History:  Diagnosis Date  . Arthritis   . Carpal tunnel syndrome     Past Surgical History:  Procedure Laterality Date  . CESAREAN SECTION       Current Outpatient Medications:  .  albuterol (PROVENTIL HFA;VENTOLIN HFA) 108 (90 Base) MCG/ACT inhaler, Inhale 1-2 puffs into the lungs every 6 (six) hours as needed for wheezing or shortness of breath., Disp: , Rfl:  .  amoxicillin-clavulanate  (AUGMENTIN) 875-125 MG tablet, Take 1 tablet by mouth 2 (two) times daily., Disp: , Rfl:  .  cephALEXin (KEFLEX) 500 MG capsule, Take 1 capsule (500 mg total) by mouth 4 (four) times daily. (Patient not taking: Reported on 06/28/2017), Disp: 28 capsule, Rfl: 0 .  HYDROcodone-acetaminophen (NORCO/VICODIN) 5-325 MG tablet, Take 1-2 tablets by mouth every 6 (six) hours as needed. (Patient not taking: Reported on 06/28/2017), Disp: 8 tablet, Rfl: 0 .  miconazole (MONISTAT 1 COMBINATION PACK) kit, Place 1 each vaginally once., Disp: , Rfl:  .  nitrofurantoin, macrocrystal-monohydrate, (MACROBID) 100 MG capsule, Take 1 capsule (100 mg total) by mouth 2 (two) times daily., Disp: 14 capsule, Rfl: 5 .  oxyCODONE-acetaminophen (PERCOCET/ROXICET) 5-325 MG tablet, Take 1-2 tablets by mouth every 4 (four) hours as needed for severe pain. (Patient not taking: Reported on 05/14/2017), Disp: 15 tablet, Rfl: 0 Allergies  Allergen Reactions  . Latex Swelling    Social History   Tobacco Use  . Smoking status: Former Research scientist (life sciences)  . Smokeless tobacco: Never Used  Substance Use Topics  . Alcohol use: Yes    Frequency: Never    Comment: socially     Family History  Problem Relation Age of Onset  . Throat cancer Mother   .  Diabetes Father   . Hypertension Father       Review of Systems  Constitutional: negative for fatigue and weight loss Respiratory: negative for cough and wheezing Cardiovascular: negative for chest pain, fatigue and palpitations Gastrointestinal: negative for abdominal pain and change in bowel habits Musculoskeletal:negative for myalgias Neurological: negative for gait problems and tremors Behavioral/Psych: negative for abusive relationship, depression Endocrine: negative for temperature intolerance    Genitourinary:negative for abnormal menstrual periods, genital lesions, hot flashes, sexual problems and vaginal discharge Integument/breast: negative for breast lump, breast tenderness, nipple  discharge and skin lesion(s)    Objective:       BP (!) 146/97   Pulse 73   Temp 98.2 F (36.8 C) (Oral)   Ht 5' 1" (1.549 m)   Wt 201 lb 12.8 oz (91.5 kg)   LMP 06/12/2017 (Exact Date)   BMI 38.13 kg/m  General:   alert  Skin:   no rash or abnormalities  Lungs:   clear to auscultation bilaterally  Heart:   regular rate and rhythm, S1, S2 normal, no murmur, click, rub or gallop  Breasts:   normal without suspicious masses, skin or nipple changes or axillary nodes  Abdomen:  normal findings: no organomegaly, soft, non-tender and no hernia  Pelvis:  External genitalia: normal general appearance Urinary system: urethral meatus normal and bladder without fullness, nontender Vaginal: normal without tenderness, induration or masses Cervix: normal appearance Adnexa: normal bimanual exam Uterus: anteverted and non-tender, normal size   Lab Review Urine pregnancy test Labs reviewed yes Radiologic studies reviewed yes  50% of 20 min visit spent on counseling and coordination of care.   Assessment and Plan:   1. Women's annual routine gynecological examination  2. Screening for cervical cancer Rx: - Cytology - PAP  3. Vaginal discharge Rx: - Cervicovaginal ancillary only  4. Frequent UTI Rx: - Urine Culture - POCT Urinalysis Dipstick - nitrofurantoin, macrocrystal-monohydrate, (MACROBID) 100 MG capsule; Take 1 capsule (100 mg total) by mouth 2 (two) times daily.  Dispense: 14 capsule; Refill: 5  5. Screening breast examination Rx: - MM Digital Screening; Future    Plan:    Education reviewed: calcium supplements, depression evaluation, low fat, low cholesterol diet, safe sex/STD prevention, self breast exams and weight bearing exercise. Mammogram ordered. Follow up in: 3 months.   Meds ordered this encounter  Medications  . nitrofurantoin, macrocrystal-monohydrate, (MACROBID) 100 MG capsule    Sig: Take 1 capsule (100 mg total) by mouth 2 (two) times daily.     Dispense:  14 capsule    Refill:  5   Orders Placed This Encounter  Procedures  . Urine Culture  . MM Digital Screening    Standing Status:   Future    Standing Expiration Date:   08/29/2018    Order Specific Question:   Reason for Exam (SYMPTOM  OR DIAGNOSIS REQUIRED)    Answer:   Screening    Order Specific Question:   Is the patient pregnant?    Answer:   No    Order Specific Question:   Preferred imaging location?    Answer:   [facility]  . POCT Urinalysis Dipstick    CHARLES A. HARPER MD 06-28-2017    

## 2017-06-28 NOTE — Progress Notes (Signed)
NGYN patient presents for Annual Exam.  CC: Frequent UTI's, pt states she is not in pain but has discomfort.   LMP:06/12/2017 BTL +HPV last year per pt  Mammogram: ?

## 2017-06-29 LAB — CERVICOVAGINAL ANCILLARY ONLY
Bacterial vaginitis: POSITIVE — AB
CHLAMYDIA, DNA PROBE: NEGATIVE
Candida vaginitis: NEGATIVE
Neisseria Gonorrhea: NEGATIVE
Trichomonas: NEGATIVE

## 2017-06-30 ENCOUNTER — Other Ambulatory Visit: Payer: Self-pay | Admitting: Obstetrics

## 2017-06-30 DIAGNOSIS — N760 Acute vaginitis: Principal | ICD-10-CM

## 2017-06-30 DIAGNOSIS — B9689 Other specified bacterial agents as the cause of diseases classified elsewhere: Secondary | ICD-10-CM

## 2017-06-30 LAB — URINE CULTURE

## 2017-06-30 MED ORDER — SECNIDAZOLE 2 G PO PACK: 1.0000 | PACK | Freq: Once | ORAL | 2 refills | Status: AC

## 2017-07-03 ENCOUNTER — Other Ambulatory Visit: Payer: Self-pay

## 2017-07-03 DIAGNOSIS — B9689 Other specified bacterial agents as the cause of diseases classified elsewhere: Secondary | ICD-10-CM

## 2017-07-03 DIAGNOSIS — N760 Acute vaginitis: Principal | ICD-10-CM

## 2017-07-03 LAB — CYTOLOGY - PAP
DIAGNOSIS: NEGATIVE
HPV 16/18/45 genotyping: NEGATIVE
HPV: DETECTED — AB

## 2017-07-03 MED ORDER — METRONIDAZOLE 0.75 % VA GEL: 1.0000 | Freq: Every day | VAGINAL | 1 refills | Status: DC

## 2017-10-07 ENCOUNTER — Emergency Department (HOSPITAL_COMMUNITY): Payer: Self-pay

## 2017-10-07 ENCOUNTER — Emergency Department (HOSPITAL_COMMUNITY)
Admission: EM | Admit: 2017-10-07 | Discharge: 2017-10-07 | Disposition: A | Discharge status: Home / Self Care | Payer: Self-pay | Attending: Emergency Medicine | Admitting: Emergency Medicine

## 2017-10-07 ENCOUNTER — Encounter (HOSPITAL_COMMUNITY): Payer: Self-pay | Admitting: Emergency Medicine

## 2017-10-07 ENCOUNTER — Other Ambulatory Visit: Payer: Self-pay

## 2017-10-07 DIAGNOSIS — Z79899 Other long term (current) drug therapy: Secondary | ICD-10-CM | POA: Insufficient documentation

## 2017-10-07 DIAGNOSIS — Z91040 Latex allergy status: Secondary | ICD-10-CM | POA: Insufficient documentation

## 2017-10-07 DIAGNOSIS — R079 Chest pain, unspecified: Secondary | ICD-10-CM

## 2017-10-07 DIAGNOSIS — Z87891 Personal history of nicotine dependence: Secondary | ICD-10-CM | POA: Insufficient documentation

## 2017-10-07 DIAGNOSIS — R51 Headache: Secondary | ICD-10-CM | POA: Insufficient documentation

## 2017-10-07 DIAGNOSIS — R072 Precordial pain: Secondary | ICD-10-CM | POA: Insufficient documentation

## 2017-10-07 LAB — BASIC METABOLIC PANEL
Anion gap: 5 (ref 5–15)
BUN: 8 mg/dL (ref 6–20)
CHLORIDE: 108 mmol/L (ref 98–111)
CO2: 26 mmol/L (ref 22–32)
Calcium: 8.5 mg/dL — ABNORMAL LOW (ref 8.9–10.3)
Creatinine, Ser: 0.82 mg/dL (ref 0.44–1.00)
Glucose, Bld: 102 mg/dL — ABNORMAL HIGH (ref 70–99)
POTASSIUM: 4.2 mmol/L (ref 3.5–5.1)
SODIUM: 139 mmol/L (ref 135–145)

## 2017-10-07 LAB — I-STAT TROPONIN, ED
Troponin i, poc: 0 ng/mL (ref 0.00–0.08)
Troponin i, poc: 0 ng/mL (ref 0.00–0.08)

## 2017-10-07 LAB — CBC
HEMATOCRIT: 34.2 % — AB (ref 36.0–46.0)
Hemoglobin: 10.1 g/dL — ABNORMAL LOW (ref 12.0–15.0)
MCH: 24.8 pg — ABNORMAL LOW (ref 26.0–34.0)
MCHC: 29.5 g/dL — ABNORMAL LOW (ref 30.0–36.0)
MCV: 83.8 fL (ref 78.0–100.0)
PLATELETS: 444 10*3/uL — AB (ref 150–400)
RBC: 4.08 MIL/uL (ref 3.87–5.11)
RDW: 15.2 % (ref 11.5–15.5)
WBC: 8.1 10*3/uL (ref 4.0–10.5)

## 2017-10-07 LAB — I-STAT BETA HCG BLOOD, ED (MC, WL, AP ONLY)

## 2017-10-07 LAB — D-DIMER, QUANTITATIVE: D-Dimer, Quant: 0.37 ug/mL-FEU (ref 0.00–0.50)

## 2017-10-07 MED ORDER — KETOROLAC TROMETHAMINE 15 MG/ML IJ SOLN
15.0000 mg | Freq: Once | INTRAMUSCULAR | Status: AC
  Administered 2017-10-07: 15 mg via INTRAVENOUS
  Filled 2017-10-07: qty 1

## 2017-10-07 MED ORDER — SUCRALFATE 1 G PO TABS
1.0000 g | ORAL_TABLET | Freq: Once | ORAL | Status: AC
  Administered 2017-10-07: 1 g via ORAL
  Filled 2017-10-07: qty 1

## 2017-10-07 MED ORDER — METHOCARBAMOL 500 MG PO TABS: 500.0000 mg | ORAL_TABLET | Freq: Two times a day (BID) | ORAL | 0 refills | Status: DC

## 2017-10-07 MED ORDER — IBUPROFEN 600 MG PO TABS: 600.0000 mg | ORAL_TABLET | Freq: Four times a day (QID) | ORAL | 0 refills | Status: DC | PRN

## 2017-10-07 MED ORDER — ONDANSETRON HCL 4 MG/2ML IJ SOLN
4.0000 mg | Freq: Once | INTRAMUSCULAR | Status: AC
  Administered 2017-10-07: 4 mg via INTRAVENOUS
  Filled 2017-10-07: qty 2

## 2017-10-07 MED ORDER — NITROGLYCERIN 0.4 MG SL SUBL
0.4000 mg | SUBLINGUAL_TABLET | SUBLINGUAL | Status: DC | PRN
  Filled 2017-10-07: qty 1

## 2017-10-07 MED ORDER — METHOCARBAMOL 1000 MG/10ML IJ SOLN: 1000.0000 mg | Freq: Once | INTRAMUSCULAR | Status: DC

## 2017-10-07 MED ORDER — METHOCARBAMOL 1000 MG/10ML IJ SOLN
1000.0000 mg | Freq: Once | INTRAVENOUS | Status: AC
  Administered 2017-10-07: 1000 mg via INTRAVENOUS
  Filled 2017-10-07: qty 10

## 2017-10-07 MED ORDER — METHOCARBAMOL 1000 MG/10ML IJ SOLN
1000.0000 mg | Freq: Once | INTRAMUSCULAR | Status: DC
  Filled 2017-10-07: qty 10

## 2017-10-07 MED ORDER — MORPHINE SULFATE (PF) 4 MG/ML IV SOLN
8.0000 mg | Freq: Once | INTRAVENOUS | Status: AC
  Administered 2017-10-07: 8 mg via INTRAVENOUS
  Filled 2017-10-07: qty 2

## 2017-10-07 NOTE — Discharge Instructions (Addendum)
We saw you in the ER for the chest pain and headaches. All the results in the ER are normal, labs and imaging. We are not sure what is causing your symptoms. The workup in the ER is not complete, and is limited to screening for life threatening and emergent conditions only, so please see a primary care doctor for further evaluation.  Please return to the ER if you have worsening chest pain, shortness of breath, pain radiating to your jaw, shoulder, or back, sweats or fainting. Otherwise see the Cardiologist or your primary care doctor as requested.

## 2017-10-07 NOTE — ED Notes (Signed)
Pt reports generalized chest pain that radiates up neck and into posterior head, began at 7am this morning. Pt c/o difficulty lifting arms due to chest pain. Pain reproduce able with palpation of chest. Denies any recent known injuries or strenuous activity

## 2017-10-07 NOTE — ED Notes (Signed)
ED Provider at bedside. 

## 2017-10-07 NOTE — ED Provider Notes (Addendum)
[place] EMERGENCY DEPARTMENT Provider Note   CSN: [id number] Arrival date & time: 10/07/17  0906     History   Chief Complaint Chief Complaint  Patient presents with  . Chest Pain  . Headache    HPI Michele Trevino is a 42 y.o. female.  HPI  42 year old female comes in with chief complaint of chest pain and headaches.. Patient reports that she woke up this morning and started noticing diffuse chest pain.  Over time her chest pain started radiating towards the back of her head.  Patient notices now that she has severe pain every time she tries to lift her arms.  Patient denies any associated numbness, tingling, vision changes, dizziness.  Patient symptoms are worse with deep inspiration and with palpation of her chest and movement of her arms.  She thinks that the pain is worse in her chest when she sits up.  There is no history of similar pain in the past.  Patient does not have any family history of premature CAD or brain aneurysm-brain tumor-brain bleed.  She denies any substance abuse, heavy alcohol use or heavy smoking.  Pt has no hx of PE, DVT and denies any exogenous hormone (testosterone / estrogen) use, long distance travels or surgery in the past 6 weeks, active cancer, recent immobilization.   Past Medical History:  Diagnosis Date  . Arthritis   . Carpal tunnel syndrome     Patient Active Problem List   Diagnosis Date Noted  . 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 OBESITY, NOS 04/19/2006  . BLEEDING, RECTAL 04/19/2006    Past Surgical History:  Procedure Laterality Date  . CESAREAN SECTION       OB History    Gravida  4   Para      Term      Preterm      AB      Living  4     SAB      TAB      Ectopic      Multiple      Live Births               Home Medications    Prior to Admission medications   Medication Sig Start Date End Date Taking? Authorizing Provider  albuterol (PROVENTIL HFA;VENTOLIN HFA) 108  (90 Base) MCG/ACT inhaler Inhale 1-2 puffs into the lungs every 6 (six) hours as needed for wheezing or shortness of breath.   Yes [provider]  amoxicillin-clavulanate (AUGMENTIN) 875-125 MG tablet Take 1 tablet by mouth 2 (two) times daily.    [provider]  cephALEXin (KEFLEX) 500 MG capsule Take 1 capsule (500 mg total) by mouth 4 (four) times daily. Patient not taking: Reported on 06/28/2017 05/14/17   Volanda Napoleon, PA-C  HYDROcodone-acetaminophen (NORCO/VICODIN) 5-325 MG tablet Take 1-2 tablets by mouth every 6 (six) hours as needed. Patient not taking: Reported on 06/28/2017 05/14/17   Volanda Napoleon, PA-C  ibuprofen (ADVIL,MOTRIN) 600 MG tablet Take 1 tablet (600 mg total) by mouth every 6 (six) hours as needed. 10/07/17   Varney Biles, MD  methocarbamol (ROBAXIN) 500 MG tablet Take 1 tablet (500 mg total) by mouth 2 (two) times daily. 10/07/17   Varney Biles, MD  metroNIDAZOLE (METROGEL) 0.75 % vaginal gel Place 1 Applicatorful vaginally at bedtime. Apply one applicatorful to vagina at bedtime for 5 days Patient not taking: Reported on 10/07/2017 07/03/17   Shelly Bombard, MD  nitrofurantoin, [place] [place], [facility])  100 MG capsule Take 1 capsule (100 mg total) by mouth 2 (two) times daily. Patient not taking: Reported on 10/07/2017 06/28/17   Shelly Bombard, MD  oxyCODONE-acetaminophen (PERCOCET/ROXICET) 5-325 MG tablet Take 1-2 tablets by mouth every 4 (four) hours as needed for severe pain. Patient not taking: Reported on 05/14/2017 03/07/17   Lacretia Leigh, MD    Family History Family History  Problem Relation Age of Onset  . Throat cancer Mother   . Diabetes Father   . Hypertension Father     Social History Social History   Tobacco Use  . Smoking status: Former Research scientist (life sciences)  . Smokeless tobacco: Never Used  Substance Use Topics  . Alcohol use: Yes    Frequency: Never    Comment: socially   . Drug use: No     Allergies    Latex   Review of Systems Review of Systems  Constitutional: Positive for activity change.  Respiratory: Positive for chest tightness. Negative for shortness of breath.   Cardiovascular: Positive for chest pain. Negative for palpitations.  Gastrointestinal: Negative for abdominal pain.  Genitourinary: Negative for dysuria.  All other systems reviewed and are negative.    Physical Exam Updated Vital Signs BP 128/79   Pulse (!) 59   Temp 97.9 F (36.6 C) (Oral)   Resp 15   Ht 5\' 1"  (1.549 m)   Wt 87.1 kg   LMP 09/29/2017   SpO2 100%   BMI 36.28 kg/m   Physical Exam  Constitutional: She is oriented to person, place, and time. She appears well-developed.  HENT:  Head: Normocephalic and atraumatic.  Eyes: EOM are normal.  Neck: Normal range of motion. Neck supple.  No meningismus  Cardiovascular: Normal rate, intact distal pulses and normal pulses.  Pulmonary/Chest: Effort normal.  Chest wall pain is reproducible with movement of the upper extremity.  Patient also has reproducible tenderness with palpation of the upper part of her chest.  Abdominal: Bowel sounds are normal.  Neurological: She is alert and oriented to person, place, and time.  Skin: Skin is warm and dry.  Nursing note and vitals reviewed.    ED Treatments / Results  Labs (all labs ordered are listed, but only abnormal results are displayed) Labs Reviewed  BASIC METABOLIC PANEL - Abnormal; Notable for the following components:      Result Value   Glucose, Bld 102 (*)    Calcium 8.5 (*)    All other components within normal limits  CBC - Abnormal; Notable for the following components:   Hemoglobin 10.1 (*)    HCT 34.2 (*)    MCH 24.8 (*)    MCHC 29.5 (*)    Platelets 444 (*)    All other components within normal limits  D-DIMER, QUANTITATIVE (NOT AT [facility])  I-STAT TROPONIN, ED  I-STAT BETA HCG BLOOD, ED ([facility], [facility], [facility] ONLY)  I-STAT TROPONIN, ED    EKG EKG Interpretation  Date/Time:  Sunday  October 07 2017 09:31:57 EDT Ventricular Rate:  65 PR Interval:  206 QRS Duration: 80 QT Interval:  412 QTC Calculation: 428 R Axis:   30 Text Interpretation:  Normal sinus rhythm with sinus arrhythmia Normal ECG No acute changes No significant change since last tracing Confirmed by Varney Biles [phone]) on 10/07/2017 11:12:50 AM   EKG Interpretation  Date/Time:  Sunday October 07 2017 12:44:42 EDT Ventricular Rate:  61 PR Interval:  206 QRS Duration: 92 QT Interval:  444 QTC Calculation: 448 R Axis:   32  Text Interpretation:  Sinus rhythm No acute changes unchanged Confirmed by Varney Biles [phone]) on 10/07/2017 1:12:52 PM       Radiology Dg Chest 2 View  Result Date: 10/07/2017 CLINICAL DATA:  Chest pain and shortness of breath since this morning with headache and difficulty moving arm. EXAM: CHEST - 2 VIEW COMPARISON:  06/09/2006 FINDINGS: The heart size and mediastinal contours are within normal limits. Both lungs are clear. Mild degenerative change of the spine. IMPRESSION: No active cardiopulmonary disease. Electronically Signed   By: Marin Olp M.D.   On: 10/07/2017 10:14   Ct Head Wo Contrast  Result Date: 10/07/2017 CLINICAL DATA:  Chronic intermittent headache. EXAM: CT HEAD WITHOUT CONTRAST TECHNIQUE: Contiguous axial images were obtained from the base of the skull through the vertex without intravenous contrast. COMPARISON:  None. FINDINGS: Brain: No evidence of acute infarction, hemorrhage, hydrocephalus, extra-axial collection or mass lesion/mass effect. Vascular: No hyperdense vessel or unexpected calcification. Skull: Normal. Negative for fracture or focal lesion. Sinuses/Orbits: No acute finding. Other: None. IMPRESSION: 1. Normal noncontrast head CT. Electronically Signed   By: Titus Dubin M.D.   On: 10/07/2017 12:29    Procedures Procedures (including critical care time)  Medications Ordered in ED Medications  nitroGLYCERIN (NITROSTAT) SL tablet 0.4 mg  (0 mg Sublingual Hold 10/07/17 1258)  morphine 4 MG/ML injection 8 mg (8 mg Intravenous Given 10/07/17 1213)  methocarbamol (ROBAXIN) 1,000 mg in dextrose 5 % 50 mL IVPB (0 mg Intravenous Stopped 10/07/17 1313)  ketorolac (TORADOL) 15 MG/ML injection 15 mg (15 mg Intravenous Given 10/07/17 1316)  sucralfate (CARAFATE) tablet 1 g (1 g Oral Given 10/07/17 1335)  ondansetron (ZOFRAN) injection 4 mg (4 mg Intravenous Given 10/07/17 1334)     Initial Impression / Assessment and Plan / ED Course  I have reviewed the triage vital signs and the nursing notes.  Pertinent labs & imaging results that were available during my care of the patient were reviewed by me and considered in my medical decision making (see chart for details).  Clinical Course as of Oct 08 1538  Sun Oct 07, 2017  1320 CT scan of the head is normal.  Patient was reassessed and she continues to have chest pain despite the morphine that she has received. Her pain continues to be reproducible with palpation and with deep inspiration.  I suspect she is having chest wall tenderness, however we will get a d-dimer given the severity of her pain which seems to be out of proportion to have simple musculoskeletal pain.  D-dimer and repeat troponin ordered.  CT Head Wo Contrast [AN]  1540 D-dimer and the repeat troponin are both negative. Results from the ER workup discussed with the patient face to face and all questions answered to the best of my ability. We will discharge her with anti-inflammatory medications  D-Dimer, Quant: 0.37 [AN]    Clinical Course User Index [AN] Varney Biles, MD    42 year old female comes in with chief complaint of chest pain and headaches. Chest pain is anterior, described as heaviness and it is radiating up to the back of the head.  With this there is no associated shortness of breath, numbness, tingling, vision changes or dizziness.  Patient also does not have any meningismus and her pulses are equal  bilaterally.  More importantly, patient does not have any risk factors for cardiovascular or neurologic emergencies.  Given that patient's headache has started within the last 6 hours, we will get a CT head to  rule out subdural hemorrhage.  My suspicion for carotid, vertebral dissection or proximal aortic dissection is extremely low.  Troponins have also been ordered.  Initial EKG does not show any acute findings.  Patient is PERC negative which is also reassuring.  Final Clinical Impressions(s) / ED Diagnoses   Final diagnoses:  Precordial chest pain  Nonspecific chest pain    ED Discharge Orders         Ordered    ibuprofen (ADVIL,MOTRIN) 600 MG tablet  Every 6 hours PRN     10/07/17 1539    methocarbamol (ROBAXIN) 500 MG tablet  2 times daily     10/07/17 [place], Lalena Salas, MD 10/07/17 1540

## 2017-10-07 NOTE — ED Triage Notes (Signed)
Pt. Stated, I woke up with some chest pain and could hardly move my arm and a headache.

## 2017-10-07 NOTE — ED Notes (Signed)
Patient transported to X-ray 

## 2017-10-18 ENCOUNTER — Ambulatory Visit: Payer: Medicaid Other

## 2017-11-12 ENCOUNTER — Other Ambulatory Visit (HOSPITAL_COMMUNITY): Payer: Self-pay | Admitting: *Deleted

## 2017-11-12 DIAGNOSIS — N6310 Unspecified lump in the right breast, unspecified quadrant: Secondary | ICD-10-CM

## 2017-11-20 ENCOUNTER — Ambulatory Visit
Admission: RE | Admit: 2017-11-20 | Discharge: 2017-11-20 | Disposition: A | Discharge status: Home / Self Care | Payer: No Typology Code available for payment source | Source: Ambulatory Visit | Attending: Obstetrics and Gynecology | Admitting: Obstetrics and Gynecology

## 2017-11-20 ENCOUNTER — Encounter (HOSPITAL_COMMUNITY): Payer: Self-pay

## 2017-11-20 ENCOUNTER — Ambulatory Visit (HOSPITAL_COMMUNITY)
Admission: RE | Admit: 2017-11-20 | Discharge: 2017-11-20 | Disposition: A | Discharge status: Home / Self Care | Payer: Self-pay | Source: Ambulatory Visit | Attending: Obstetrics and Gynecology | Admitting: Obstetrics and Gynecology

## 2017-11-20 ENCOUNTER — Ambulatory Visit: Payer: Medicaid Other

## 2017-11-20 VITALS — BP 118/80 | Ht 61.0 in | Wt 206.0 lb

## 2017-11-20 DIAGNOSIS — N6310 Unspecified lump in the right breast, unspecified quadrant: Secondary | ICD-10-CM

## 2017-11-20 DIAGNOSIS — Z1239 Encounter for other screening for malignant neoplasm of breast: Secondary | ICD-10-CM

## 2017-11-20 DIAGNOSIS — N644 Mastodynia: Secondary | ICD-10-CM

## 2017-11-20 NOTE — Addendum Note (Signed)
Encounter addended by: Loletta Parish, RN on: 11/20/2017 9:21 AM  Actions taken: Sign clinical note

## 2017-11-20 NOTE — Patient Instructions (Addendum)
Explained breast self awareness with Michele Trevino. Patient did not need a Pap smear today due to last Pap smear was 5/9/20019. Let patient know that her next Pap smear is due in one year since her last Pap smear was HPV positive. Referred patient to the [facility] for a daignostic mammogram and possible right breast ultrasound. Appointment scheduled for Tuesday, November 20, 2017 at 1410. Patient aware of appointment and will be there. Michele Trevino verbalized understanding.  Michele Trevino, Michele [place], RN 8:31 AM

## 2017-11-20 NOTE — Progress Notes (Signed)
Complaints of a right breast lump x 1 month that started off painful and red. Patient stated after 1.5 weeks that the pain and redness resolved.    Pap Smear: Pap smear not completed today. Last Pap smear was 06/28/2017 at [facility] and normal with positive HPV. Per patient has no history of an abnormal Pap smear. Last Pap smear result is in Epic.  Physical exam: Breasts Breasts symmetrical. No skin abnormalities left breast. Scar observed on right upper breast that per patient is where the redness occurred one month ago. No nipple retraction bilateral breasts. No nipple discharge bilateral breasts. No lymphadenopathy. No lumps palpated bilateral breasts. Complaints of diffuse right breast pain that was greater within the outer breast on exam. Referred patient to the [facility] for a daignostic mammogram and possible right breast ultrasound. Appointment scheduled for Tuesday, November 20, 2017 at 1410.        Pelvic/Bimanual No Pap smear completed today since last Pap smear was 06/28/2017. Pap smear not indicated per BCCCP guidelines.    Smoking History: Patient is a former smoker that quit in 2007.  Patient Navigation: Patient education provided. Access to services provided for patient through BCCCP program.   Breast and Cervical Cancer Risk Assessment: Patient has no family history of breast cancer, known genetic mutations, or radiation treatment to the chest before age 26. Patient has no history of cervical dysplasia, immunocompromised, or DES exposure in-utero.  Risk Assessment    Risk Scores      11/20/2017   Last edited by: [name], LPN   5-year risk: 0.8 %   Lifetime risk: 9.4 %

## 2017-12-17 ENCOUNTER — Encounter (HOSPITAL_COMMUNITY): Payer: Self-pay | Admitting: *Deleted

## 2017-12-25 ENCOUNTER — Other Ambulatory Visit (HOSPITAL_COMMUNITY): Payer: Self-pay | Admitting: *Deleted

## 2017-12-25 DIAGNOSIS — Z0000 Encounter for general adult medical examination without abnormal findings: Secondary | ICD-10-CM

## 2017-12-27 NOTE — Addendum Note (Signed)
Addended by: Unice Bailey B on: 12/27/2017 01:24 PM   Modules accepted: Orders

## 2017-12-28 ENCOUNTER — Inpatient Hospital Stay: Payer: No Typology Code available for payment source

## 2017-12-28 ENCOUNTER — Inpatient Hospital Stay: Payer: No Typology Code available for payment source | Attending: Obstetrics and Gynecology | Admitting: *Deleted

## 2017-12-28 VITALS — BP 112/70 | Ht 61.0 in | Wt 207.0 lb

## 2017-12-28 DIAGNOSIS — Z0000 Encounter for general adult medical examination without abnormal findings: Secondary | ICD-10-CM

## 2017-12-28 LAB — LIPID PANEL
CHOL/HDL RATIO: 3.3 ratio
CHOLESTEROL: 172 mg/dL (ref 0–200)
HDL: 52 mg/dL (ref >40)
LDL CALC: 106 mg/dL — AB (ref 0–99)
Triglycerides: 69 mg/dL (ref <150)
VLDL: 14 mg/dL (ref 0–40)

## 2017-12-28 LAB — HEMOGLOBIN A1C
Hgb A1c MFr Bld: 5.7 % — ABNORMAL HIGH (ref 4.8–5.6)
MEAN PLASMA GLUCOSE: 116.89 mg/dL

## 2017-12-28 NOTE — Progress Notes (Signed)
[facility] initial screening  Clinical Measurement:  Height:  61in Weight: 207lb  Blood Pressure: 118/72 Blood Pressure #2: 112/70   Fasting Labs Drawn Today, will review with patient when they result.  Medical History:  Patient states that she has not been diagnosed with high cholesterol, high blood pressure, diabetes or heart disease.  Medications:  Patients states she is not taking any medications for high cholesterol, high blood pressure or diabetes.  She is not taking aspirin daily to prevent heart attack or stroke.    Blood pressure, self measurement:  Patients states she does not measure blood pressure at home.    Nutrition:  Patient states she eats 2 cups of fruit and 1 cup of vegetables in an average day.  Patient states she does not eat fish regularly, she eats more than half a serving of whole grains daily. She drinks less than 36 ounces of beverages with added sugar weekly.  She is currently watching her sodium intake.  She has  had 1 drink containing alcohol in the last seven days.    Physical activity:  Patient states that she gets 300 minutes of moderate exercise in a week.  She gets 105 minutes of vigorous exercise per week.    Smoking status:  Patient states she has never smoked and is not around any smokers.    Quality of life:  Patient states that she has had 0 bad physical days out of the last 30 days. In the last 2 weeks, she has had several days that she has felt down or depressed. She has had several days in the last 2 weeks that she has had little interest or pleasure in doing things.  Risk reduction and counseling:  Patient states she wants to lose weight and increase fruit and vegetable intake.  I encouraged her to continue with current exercise regimen and increase vegetable and fruit intake.  Navigation:  I will notify patient of lab results.  Patient is aware of 2 more health coaching sessions and a follow up.

## 2017-12-31 ENCOUNTER — Telehealth (HOSPITAL_COMMUNITY): Payer: Self-pay | Admitting: *Deleted

## 2017-12-31 NOTE — Telephone Encounter (Signed)
Health coaching 2  Labs-LDL cholesterol 106,cholesterol 172, triglycerides 69, HDL cholesterol 52, hemoglobin A1C  5.7, mean plasma glucose 116.89  Patient is aware and understands these lab results.  Goals-Patient states that she works out every day for 60 minutes.  I encouraged patient to keep doing this routine.  Patient states she wants to eat more vegetable and fruits. I also encouraged her to eat heart healthy fish such as salmon,mackeral and tuna.  Navigation:  Patient is aware of 1 more health coaching sessions and a follow up.   Time- 10 minutes

## 2018-01-21 ENCOUNTER — Telehealth: Payer: Self-pay

## 2018-01-22 ENCOUNTER — Telehealth: Payer: Self-pay

## 2018-01-22 NOTE — Telephone Encounter (Signed)
Health Coaching 3  Current:  Patient states that she is eating 4-5 servings of fruit (apples, bananas, blueberries, pineapples) per day and 2 servings of vegetables (broccoli, bell pepper, mushrooms, asparagus, sweet potatoes) per day.  Patient states that she is eating 2 servings of whole grains (oatmeal, whole grain bread, popcorn) per day.  Patient states that she is eating fish (whiting) once a month and it's usually fried.  Patient states that she eats fried foods only occasionally.  Patient states that her consumption of sugar drinks has decreased from 2-3/day to 1/week.  Patient states learning her A1c results from the [facility] program helped her decide to make this change.  Patient states that she drinks 7 bottles of water/day (16.9 oz bottles).  Patient states that she rarely eats meat, maybe chicken 3 times/week.  Patient states that for the last 2-3 months she has been going to the gym (elliptical, arms, legs, stretches, treadmill) for one hour each day.  New Goal:    Goal #1: Patient states her goal is to replace potato chips in her diet with seeds, nuts, beet chips, and sweet potato chips by March 23, 2018.  Barrier(s) to reaching goal:  Patient states that money is a possible barrier to her achieving her goal.  Strategies to overcome barrier(s):  Patient states her strategy is to get a job and to shop by the week.  Patient states that she wants to get everything she needs for her and her family for two days at a time.  Patient states that she believes this will help her to maintain healthy snacks in her diet.  Confidence Level for achieving goal (1-10):  Patient states that her confidence level for achieving her goal is 9.  Goal #2: Patient states her goal is to increase her "exercise time" to 2 hours/day in the gym by Feb. 1, 2020.  Barrier(s) to reaching goal:  Patient states that money is a possible barrier to her achieving her goal.  Strategies to overcome barrier(s):  Patient  states her strategy is to get a job.  Confidence Level for achieving goal (1-10):  Patient states that her confidence level for achieving her goal is 8.  Navigation:  Patient is aware of a follow-up session, said we can call her anytime.  Time:  29 minutes

## 2018-02-14 ENCOUNTER — Encounter (INDEPENDENT_AMBULATORY_CARE_PROVIDER_SITE_OTHER): Payer: Self-pay

## 2018-02-14 ENCOUNTER — Ambulatory Visit (INDEPENDENT_AMBULATORY_CARE_PROVIDER_SITE_OTHER): Payer: Self-pay | Admitting: Physician Assistant

## 2018-02-23 ENCOUNTER — Encounter (HOSPITAL_COMMUNITY): Payer: Self-pay

## 2018-02-23 ENCOUNTER — Other Ambulatory Visit: Payer: Self-pay

## 2018-02-23 ENCOUNTER — Emergency Department (HOSPITAL_COMMUNITY)
Admission: EM | Admit: 2018-02-23 | Discharge: 2018-02-23 | Disposition: A | Discharge status: Home / Self Care | Payer: Self-pay | Attending: Emergency Medicine | Admitting: Emergency Medicine

## 2018-02-23 ENCOUNTER — Emergency Department (HOSPITAL_COMMUNITY): Payer: Self-pay

## 2018-02-23 DIAGNOSIS — Z79899 Other long term (current) drug therapy: Secondary | ICD-10-CM | POA: Insufficient documentation

## 2018-02-23 DIAGNOSIS — R102 Pelvic and perineal pain: Secondary | ICD-10-CM | POA: Insufficient documentation

## 2018-02-23 DIAGNOSIS — Z87891 Personal history of nicotine dependence: Secondary | ICD-10-CM | POA: Insufficient documentation

## 2018-02-23 DIAGNOSIS — M545 Low back pain, unspecified: Secondary | ICD-10-CM

## 2018-02-23 LAB — CBC WITH DIFFERENTIAL/PLATELET
Abs Immature Granulocytes: 0.03 10*3/uL (ref 0.00–0.07)
BASOS PCT: 0 %
Basophils Absolute: 0 10*3/uL (ref 0.0–0.1)
Eosinophils Absolute: 0.3 10*3/uL (ref 0.0–0.5)
Eosinophils Relative: 3 %
HCT: 32.9 % — ABNORMAL LOW (ref 36.0–46.0)
Hemoglobin: 10 g/dL — ABNORMAL LOW (ref 12.0–15.0)
Immature Granulocytes: 0 %
LYMPHS ABS: 3.7 10*3/uL (ref 0.7–4.0)
Lymphocytes Relative: 36 %
MCH: 24.8 pg — ABNORMAL LOW (ref 26.0–34.0)
MCHC: 30.4 g/dL (ref 30.0–36.0)
MCV: 81.4 fL (ref 80.0–100.0)
MONOS PCT: 8 %
Monocytes Absolute: 0.8 10*3/uL (ref 0.1–1.0)
Neutro Abs: 5.4 10*3/uL (ref 1.7–7.7)
Neutrophils Relative %: 53 %
Platelets: 508 10*3/uL — ABNORMAL HIGH (ref 150–400)
RBC: 4.04 MIL/uL (ref 3.87–5.11)
RDW: 15.9 % — ABNORMAL HIGH (ref 11.5–15.5)
WBC: 10.3 10*3/uL (ref 4.0–10.5)
nRBC: 0 % (ref 0.0–0.2)

## 2018-02-23 LAB — COMPREHENSIVE METABOLIC PANEL
ALT: 17 U/L (ref 0–44)
ANION GAP: 7 (ref 5–15)
AST: 22 U/L (ref 15–41)
Albumin: 3.9 g/dL (ref 3.5–5.0)
Alkaline Phosphatase: 41 U/L (ref 38–126)
BILIRUBIN TOTAL: 0.4 mg/dL (ref 0.3–1.2)
BUN: 8 mg/dL (ref 6–20)
CO2: 24 mmol/L (ref 22–32)
Calcium: 8.9 mg/dL (ref 8.9–10.3)
Chloride: 107 mmol/L (ref 98–111)
Creatinine, Ser: 0.86 mg/dL (ref 0.44–1.00)
GFR calc non Af Amer: >60 mL/min (ref >60)
Glucose, Bld: 113 mg/dL — ABNORMAL HIGH (ref 70–99)
Potassium: 4.4 mmol/L (ref 3.5–5.1)
Sodium: 138 mmol/L (ref 135–145)
Total Protein: 7.6 g/dL (ref 6.5–8.1)

## 2018-02-23 LAB — LIPASE, BLOOD: LIPASE: 30 U/L (ref 11–51)

## 2018-02-23 LAB — WET PREP, GENITAL
Sperm: NONE SEEN
TRICH WET PREP: NONE SEEN
Yeast Wet Prep HPF POC: NONE SEEN

## 2018-02-23 LAB — URINALYSIS, ROUTINE W REFLEX MICROSCOPIC
BILIRUBIN URINE: NEGATIVE
Glucose, UA: NEGATIVE mg/dL
Hgb urine dipstick: NEGATIVE
KETONES UR: NEGATIVE mg/dL
LEUKOCYTES UA: NEGATIVE
Nitrite: NEGATIVE
PROTEIN: NEGATIVE mg/dL
Specific Gravity, Urine: <1.005 — ABNORMAL LOW (ref 1.005–1.030)
pH: 6.5 (ref 5.0–8.0)

## 2018-02-23 LAB — PREGNANCY, URINE: Preg Test, Ur: NEGATIVE

## 2018-02-23 MED ORDER — MORPHINE SULFATE (PF) 4 MG/ML IV SOLN
4.0000 mg | Freq: Once | INTRAVENOUS | Status: AC
  Administered 2018-02-23: 4 mg via INTRAVENOUS
  Filled 2018-02-23: qty 1

## 2018-02-23 MED ORDER — IOPAMIDOL (ISOVUE-300) INJECTION 61%
INTRAVENOUS | Status: AC
  Filled 2018-02-23: qty 100

## 2018-02-23 MED ORDER — DIAZEPAM 5 MG PO TABS
5.0000 mg | ORAL_TABLET | Freq: Once | ORAL | Status: AC
  Administered 2018-02-23: 5 mg via ORAL
  Filled 2018-02-23: qty 1

## 2018-02-23 MED ORDER — ONDANSETRON HCL 4 MG/2ML IJ SOLN
4.0000 mg | Freq: Once | INTRAMUSCULAR | Status: AC
  Administered 2018-02-23: 4 mg via INTRAVENOUS
  Filled 2018-02-23: qty 2

## 2018-02-23 MED ORDER — SODIUM CHLORIDE (PF) 0.9 % IJ SOLN
INTRAMUSCULAR | Status: AC
  Filled 2018-02-23: qty 50

## 2018-02-23 MED ORDER — PREDNISONE 50 MG PO TABS: 50.0000 mg | ORAL_TABLET | Freq: Every day | ORAL | 0 refills | Status: DC

## 2018-02-23 MED ORDER — LIDOCAINE 5 % EX PTCH: 1.0000 | MEDICATED_PATCH | CUTANEOUS | 0 refills | Status: DC

## 2018-02-23 MED ORDER — KETOROLAC TROMETHAMINE 15 MG/ML IJ SOLN
15.0000 mg | Freq: Once | INTRAMUSCULAR | Status: AC
  Administered 2018-02-23: 15 mg via INTRAVENOUS
  Filled 2018-02-23: qty 1

## 2018-02-23 MED ORDER — METHOCARBAMOL 500 MG PO TABS: 500.0000 mg | ORAL_TABLET | Freq: Three times a day (TID) | ORAL | 0 refills | Status: DC | PRN

## 2018-02-23 MED ORDER — OXYCODONE-ACETAMINOPHEN 5-325 MG PO TABS
1.0000 | ORAL_TABLET | ORAL | Status: DC | PRN
  Administered 2018-02-23: 1 via ORAL
  Filled 2018-02-23: qty 1

## 2018-02-23 MED ORDER — IOPAMIDOL (ISOVUE-300) INJECTION 61%
100.0000 mL | Freq: Once | INTRAVENOUS | Status: AC | PRN
  Administered 2018-02-23: 100 mL via INTRAVENOUS

## 2018-02-23 MED ORDER — HYDROMORPHONE HCL 1 MG/ML IJ SOLN
1.0000 mg | Freq: Once | INTRAMUSCULAR | Status: AC
  Administered 2018-02-23: 1 mg via INTRAVENOUS
  Filled 2018-02-23: qty 1

## 2018-02-23 MED ORDER — LIDOCAINE 5 % EX PTCH
1.0000 | MEDICATED_PATCH | Freq: Once | CUTANEOUS | Status: DC
  Administered 2018-02-23: 1 via TRANSDERMAL
  Filled 2018-02-23: qty 1

## 2018-02-23 MED ORDER — NAPROXEN 500 MG PO TABS: 500.0000 mg | ORAL_TABLET | Freq: Two times a day (BID) | ORAL | 0 refills | Status: DC

## 2018-02-23 NOTE — Discharge Instructions (Addendum)
You were seen in the ER today for pelvic and abdominal pain. Your labs were all fairly similar to previous. Your CT scan showed some degenerative changes in your back.  WE suspect your pains is more so coming from the back. Possibly from a slipped disc with muscle issues. We are sending you home with multiple medicines to help with this:  - Naproxen is a nonsteroidal anti-inflammatory medication that will help with pain and swelling. Be sure to take this medication as prescribed with food, 1 pill every 12 hours,  It should be taken with food, as it can cause stomach upset, and more seriously, stomach bleeding. Do not take other nonsteroidal anti-inflammatory medications with this such as Advil, Motrin, Aleve, Mobic, Goodie Powder, or Motrin.    - Robaxin is the muscle relaxer I have prescribed, this is meant to help with muscle tightness. Be aware that this medication may make you drowsy therefore the first time you take this it should be at a time you are in an environment where you can rest. Do not drive or operate heavy machinery when taking this medication. Do not drink alcohol or take other sedating medications with this medicine such as narcotics or benzodiazepines.   - Prednisone- this is a steroid to help with inflammation, take this once each morning for 5 days.   - Lidoderm patches- these are topical patches- place place one over the most prominent area of pain each day.   You make take Tylenol per over the counter dosing with these medications.   We have prescribed you new medication(s) today. Discuss the medications prescribed today with your pharmacist as they can have adverse effects and interactions with your other medicines including over the counter and prescribed medications. Seek medical evaluation if you start to experience new or abnormal symptoms after taking one of these medicines, seek care immediately if you start to experience difficulty breathing, feeling of your throat  closing, facial swelling, or rash as these could be indications of a more serious allergic reaction  The application of heat can help soothe the pain.  Maintaining your daily activities, including walking, is encourged, as it will help you get better faster than just staying in bed.  Please follow-up closely with your primary care provider and/or with orthopedics in the next 3 to 5 days.  Return to the ER for new or worsening symptoms or any other concerns.

## 2018-02-23 NOTE — ED Triage Notes (Addendum)
Pt reports thoracic back pain and pelvic pain that started yesterday. She states that the pelvic pain started yesterday. She took some monistat thinking that it was a yeast infection. States that she feels a sharp pain after urination, but no burning with urination. A&Ox4.

## 2018-02-23 NOTE — ED Notes (Signed)
Pt placed on purewick to obtain urine sample. Pt still in pain 10/10. Pt and family upset they have not seen a provider yet. Staff explained the delay.

## 2018-02-23 NOTE — ED Provider Notes (Signed)
[facility] DEPT Provider Note   CSN: [id number] Arrival date & time: 02/23/18  0018     History   Chief Complaint Chief Complaint  Patient presents with  . Back Pain  . Pelvic Pain    HPI Michele Trevino is a 43 y.o. female with a hx of uterine fibroids and prior c-section who presents to the ED with complaints of pelvic pain and back pain since yesterday. Patient states that the pelvic pain started first, is bilateral, and is sharp in nature. She states shortly after she developed lower back pain which is bilateral, stabbing & burning in nature, and seems to be much worse than her pelvic pain. Pain is worse with movement. Briefly alleviated with tea tree oil yesterday. She also utilized [employer] as she thought she may have a yeast infection, but no relief occurred, she was not having discharge. Has had some nausea without vomiting. Denies fever, chills, chest pain, dyspnea, dysuria, vagina bleeding, vaginal discharge, or diarrhea. Denies numbness, tingling, weakness, saddle anesthesia, incontinence to bowel/bladder, fever, chills, IV drug use, dysuria, or hx of cancer. Patient has not had prior back surgeries.   HPI  Past Medical History:  Diagnosis Date  . Arthritis   . Carpal tunnel syndrome     Patient Active Problem List   Diagnosis Date Noted  . 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 OBESITY, NOS 04/19/2006  . BLEEDING, RECTAL 04/19/2006    Past Surgical History:  Procedure Laterality Date  . CESAREAN SECTION       OB History    Gravida  4   Para      Term      Preterm      AB      Living  4     SAB      TAB      Ectopic      Multiple      Live Births  4            Home Medications    Prior to Admission medications   Medication Sig Start Date End Date Taking? Authorizing Provider  albuterol (PROVENTIL HFA;VENTOLIN HFA) 108 (90 Base) MCG/ACT inhaler Inhale 1-2 puffs into the lungs every 6 (six) hours as needed  for wheezing or shortness of breath.   Yes [provider]  amoxicillin-clavulanate (AUGMENTIN) 875-125 MG tablet Take 1 tablet by mouth 2 (two) times daily.    [provider]  cephALEXin (KEFLEX) 500 MG capsule Take 1 capsule (500 mg total) by mouth 4 (four) times daily. Patient not taking: Reported on 06/28/2017 05/14/17   Volanda Napoleon, PA-C  HYDROcodone-acetaminophen (NORCO/VICODIN) 5-325 MG tablet Take 1-2 tablets by mouth every 6 (six) hours as needed. Patient not taking: Reported on 06/28/2017 05/14/17   Volanda Napoleon, PA-C  ibuprofen (ADVIL,MOTRIN) 600 MG tablet Take 1 tablet (600 mg total) by mouth every 6 (six) hours as needed. Patient not taking: Reported on 11/20/2017 10/07/17   Varney Biles, MD  methocarbamol (ROBAXIN) 500 MG tablet Take 1 tablet (500 mg total) by mouth 2 (two) times daily. Patient not taking: Reported on 11/20/2017 10/07/17   Varney Biles, MD  metroNIDAZOLE (METROGEL) 0.75 % vaginal gel Place 1 Applicatorful vaginally at bedtime. Apply one applicatorful to vagina at bedtime for 5 days Patient not taking: Reported on 10/07/2017 07/03/17   Shelly Bombard, MD  nitrofurantoin, macrocrystal-monohydrate, (MACROBID) 100 MG capsule Take 1 capsule (100 mg total) by mouth 2 (two) times daily. Patient  not taking: Reported on 10/07/2017 06/28/17   Shelly Bombard, MD  oxyCODONE-acetaminophen (PERCOCET/ROXICET) 5-325 MG tablet Take 1-2 tablets by mouth every 4 (four) hours as needed for severe pain. Patient not taking: Reported on 05/14/2017 03/07/17   Lacretia Leigh, MD    Family History Family History  Problem Relation Age of Onset  . Throat cancer Mother   . Diabetes Father   . Hypertension Father     Social History Social History   Tobacco Use  . Smoking status: Former Research scientist (life sciences)  . Smokeless tobacco: Never Used  Substance Use Topics  . Alcohol use: Yes    Frequency: Never    Comment: socially   . Drug use: No     Allergies    Latex   Review of Systems Review of Systems  Constitutional: Negative for chills and fever.  Respiratory: Negative for shortness of breath.   Cardiovascular: Negative for chest pain.  Gastrointestinal: Positive for nausea. Negative for constipation, diarrhea and vomiting.  Genitourinary: Positive for pelvic pain. Negative for dysuria, vaginal bleeding and vaginal discharge.  Musculoskeletal: Positive for back pain.  Neurological: Negative for weakness and numbness.       Negative for incontinence or saddle anesthesia.   All other systems reviewed and are negative.  Physical Exam Updated Vital Signs BP 116/84   Pulse 62   Temp 98.2 F (36.8 C) (Oral)   Resp 20   SpO2 99%   Physical Exam Vitals signs and nursing note reviewed. Exam conducted with a chaperone present.  Constitutional:      General: She is in acute distress (mild, patient appears uncomfortable).     Appearance: She is well-developed. She is not toxic-appearing.  HENT:     Head: Normocephalic and atraumatic.  Eyes:     General:        Right eye: No discharge.        Left eye: No discharge.     Conjunctiva/sclera: Conjunctivae normal.  Neck:     Musculoskeletal: Neck supple.  Cardiovascular:     Rate and Rhythm: Normal rate and regular rhythm.  Pulmonary:     Effort: Pulmonary effort is normal. No respiratory distress.     Breath sounds: Normal breath sounds. No wheezing, rhonchi or rales.  Abdominal:     General: There is no distension.     Palpations: Abdomen is soft.     Tenderness: There is abdominal tenderness in the suprapubic area. There is no guarding or rebound.  Genitourinary:    Exam position: Supine.     Labia:        Right: No lesion.        Left: No lesion.      Comments: Patient diffusely uncomfortable throughout pelvic exam. No point/focal tenderness. She does have white topical cream present at vaginal introitus consistent with hx of use of monistat. There is some white to light yellow  colored discharge in the vaginal canal.  Musculoskeletal:     Comments: No obvious deformity, appreciable swelling, erythema, ecchymosis, open wounds, or rashes.  Back: Patient diffusely tender throughout the lumbar region including midline and bilateral paraspinal muscles, she is tender to both light and deep palpation.   Skin:    General: Skin is warm and dry.     Findings: No rash.  Neurological:     Mental Status: She is alert.     Comments: Clear speech. Sensation grossly intact x 4. 5/5 symmetric grip strength. 5/5 symmetric strength with plantar/dorsiflexion bilaterally.  Psychiatric:        Behavior: Behavior normal.      ED Treatments / Results  Labs (all labs ordered are listed, but only abnormal results are displayed) Labs Reviewed  WET PREP, GENITAL - Abnormal; Notable for the following components:      Result Value   Clue Cells Wet Prep HPF POC PRESENT (*)    WBC, Wet Prep HPF POC FEW (*)    All other components within normal limits  URINALYSIS, ROUTINE W REFLEX MICROSCOPIC - Abnormal; Notable for the following components:   Color, Urine STRAW (*)    APPearance HAZY (*)    Specific Gravity, Urine <1.005 (*)    All other components within normal limits  CBC WITH DIFFERENTIAL/PLATELET - Abnormal; Notable for the following components:   Hemoglobin 10.0 (*)    HCT 32.9 (*)    MCH 24.8 (*)    RDW 15.9 (*)    Platelets 508 (*)    All other components within normal limits  COMPREHENSIVE METABOLIC PANEL - Abnormal; Notable for the following components:   Glucose, Bld 113 (*)    All other components within normal limits  PREGNANCY, URINE  LIPASE, BLOOD  GC/CHLAMYDIA PROBE AMP ([place]) NOT AT [facility]    EKG None  Radiology Ct Abdomen Pelvis W Contrast  Result Date: 02/23/2018 CLINICAL DATA:  43 year old female with thoracic back and pelvic pain since yesterday EXAM: CT ABDOMEN AND PELVIS WITH CONTRAST TECHNIQUE: Multidetector CT imaging of the abdomen and  pelvis was performed using the standard protocol following bolus administration of intravenous contrast. CONTRAST:  125mL ISOVUE-300 IOPAMIDOL (ISOVUE-300) INJECTION 61% COMPARISON:  Prior CT scan of the abdomen and pelvis 05/14/2017 and 09/01/2005 FINDINGS: Lower chest: 5 mm subpleural pulmonary nodule in the medial aspect of the right lower lobe. No significant interval change compared to 05/14/2017. The visualized lower lungs are otherwise clear. The visualized cardiac structures are normal in appearance as is the distal thoracic esophagus. Hepatobiliary: Normal hepatic contour and morphology. No discrete hepatic lesions. Normal appearance of the gallbladder. No intra or extrahepatic biliary ductal dilatation. Pancreas: Unremarkable. No pancreatic ductal dilatation or surrounding inflammatory changes. Spleen: Normal in size without focal abnormality. Adrenals/Urinary Tract: Adrenal glands are unremarkable. Kidneys are normal, without renal calculi, focal lesion, or hydronephrosis. Bladder is unremarkable. Stomach/Bowel: No evidence of obstruction or focal bowel wall thickening. Normal appendix in the right lower quadrant. The terminal ileum is unremarkable. Vascular/Lymphatic: No significant vascular findings are present. No enlarged abdominal or pelvic lymph nodes. Reproductive: Similar appearance of the uterus which is adherent to the anterior abdominal wall in the region of a prior Caesarean section scar. No focal adnexal mass. Other: No abdominal wall hernia or abnormality. No abdominopelvic ascites. Musculoskeletal: No acute fracture or aggressive appearing lytic or blastic osseous lesion. Mild bilateral L4-L5 facet arthropathy. IMPRESSION: 1. No acute abnormality within the abdomen or pelvis. 2. Similar appearance of the uterus which appears adherent to the anterior abdominal wall in the region of a prior Caesarean section scar. 3. Mild bilateral L4-L5 facet arthropathy. Electronically Signed   By: Jacqulynn Cadet M.D.   On: 02/23/2018 09:20    Procedures Procedures (including critical care time)  Medications Ordered in ED Medications  oxyCODONE-acetaminophen (PERCOCET/ROXICET) 5-325 MG per tablet 1 tablet (1 tablet Oral Given 02/23/18 0147)  iopamidol (ISOVUE-300) 61 % injection (has no administration in time range)  sodium chloride (PF) 0.9 % injection (has no administration in time range)  lidocaine (LIDODERM) 5 %  1 patch (1 patch Transdermal Patch Applied 02/23/18 1312)  morphine 4 MG/ML injection 4 mg (4 mg Intravenous Given 02/23/18 0650)  ondansetron (ZOFRAN) injection 4 mg (4 mg Intravenous Given 02/23/18 0648)  iopamidol (ISOVUE-300) 61 % injection 100 mL (100 mLs Intravenous Contrast Given 02/23/18 0837)  ketorolac (TORADOL) 15 MG/ML injection 15 mg (15 mg Intravenous Given 02/23/18 0942)  diazepam (VALIUM) tablet 5 mg (5 mg Oral Given 02/23/18 0942)  HYDROmorphone (DILAUDID) injection 1 mg (1 mg Intravenous Given 02/23/18 1312)   Initial Impression / Assessment and Plan / ED Course  I have reviewed the triage vital signs and the nursing notes.  Pertinent labs & imaging results that were available during my care of the patient were reviewed by me and considered in my medical decision making (see chart for details).   Patient presents to the ED with pelvic & back pain since yesterday. Patient appears uncomfortable but non toxic. Her vitals are without significant abnormalities. On exam she is tender to the suprapubic area as well as the diffuse lumbar region. No back pain red flags or neuro deficits. Pelvic exam with diffuse discomfort. Labs & CT abdomen/pelvis ordered.   Labs reviewed: baseline anemia. no leukocytosis. Platelets are elevated similar to prior. No electrolyte disturbance. LFTs, renal function, & lipase WNL. UA without infection. Wet prep with BV findings, however, no complaints of vaginal discharge. GC/chlamydia pending, she was treated for PID 1 year prior, she states this does  not feel similar and that she is sexually active in a monogamous relationship with her husband now, doubt PID. Her CT abdomen/pelvis showed no acute abnormality within the abdomen or pelvis. There is similar appearance of the uterus which appears adherent to the anterior abdominal wall in the region of a prior Caesarean section scar. Mild bilateral L4-L5 facet arthropathy.   Patient continues to have severe pain following morphine.  She had some relief following toradol/valium, but remains uncomfortable. Discussed with supervising physician Dr. Maryan Rued who personally evaluated patient- in agreement sxs seem back oriented, likely muscular with possible slipped disc, no emergent indication for MRI, no lower extremity complaints or deficits, no incontinence/saddle anesthesia, no concern for infectious process in the spine, doubt cauda equina, spinal abscess, or transverse myelitis. Recommends dilaudid and lidocaine patch with trial of ambulation which I am in agreement.   Patient ultimately feeling a bit better and ready to go home. She has been able to ambulate short distance in the department. Discharge home with prednisone, naproxen, robaxin, and lidoderm patches. Discussed no driving/operating heavy machinery when taking robaxin. Close PCP follow up. I discussed results, treatment plan, need for follow-up, and return precautions with the patient and her husband. Provided opportunity for questions, patient and her husband confirmed understanding and are in agreement with plan.   Findings and plan of care discussed with supervising physician Dr. Maryan Rued who personally evaluated and examined this patient and is in agreement.   Final Clinical Impressions(s) / ED Diagnoses   Final diagnoses:  Acute bilateral low back pain without sciatica    ED Discharge Orders         Ordered    methocarbamol (ROBAXIN) 500 MG tablet  Every 8 hours PRN     02/23/18 1525    predniSONE (DELTASONE) 50 MG tablet  Daily  with breakfast     02/23/18 1525    naproxen (NAPROSYN) 500 MG tablet  2 times daily     02/23/18 1525    lidocaine (LIDODERM) 5 %  Every  24 hours     02/23/18 [address] Glynda Jaeger, PA-C 02/23/18 1548    Blanchie Dessert, MD 02/25/18 2202

## 2018-02-23 NOTE — ED Notes (Signed)
Pt asked to go to restroom before triage.

## 2018-02-23 NOTE — ED Notes (Signed)
Pt and visitor are upset because they have not seen a provider yet. Pt c/o 10/10 sharp pain in lower back and pelvic area

## 2018-02-23 NOTE — ED Notes (Signed)
Pt states that the percocet did not help her pain at all.

## 2018-02-25 LAB — GC/CHLAMYDIA PROBE AMP (~~LOC~~) NOT AT ARMC
Chlamydia: NEGATIVE
Neisseria Gonorrhea: NEGATIVE

## 2018-02-26 ENCOUNTER — Ambulatory Visit (INDEPENDENT_AMBULATORY_CARE_PROVIDER_SITE_OTHER): Payer: Self-pay | Admitting: Physician Assistant

## 2018-02-26 ENCOUNTER — Encounter (INDEPENDENT_AMBULATORY_CARE_PROVIDER_SITE_OTHER): Payer: Self-pay | Admitting: Physician Assistant

## 2018-02-26 ENCOUNTER — Other Ambulatory Visit: Payer: Self-pay

## 2018-02-26 VITALS — BP 133/80 | HR 78 | Temp 97.9°F | Ht 61.0 in | Wt 213.4 lb

## 2018-02-26 DIAGNOSIS — M545 Low back pain, unspecified: Secondary | ICD-10-CM

## 2018-02-26 DIAGNOSIS — R202 Paresthesia of skin: Secondary | ICD-10-CM

## 2018-02-26 DIAGNOSIS — M62830 Muscle spasm of back: Secondary | ICD-10-CM

## 2018-02-26 MED ORDER — ACETAMINOPHEN-CODEINE #3 300-30 MG PO TABS: 1.0000 | ORAL_TABLET | Freq: Three times a day (TID) | ORAL | 0 refills | Status: AC | PRN

## 2018-02-26 MED ORDER — MELOXICAM 15 MG PO TABS: 15.0000 mg | ORAL_TABLET | Freq: Every day | ORAL | 0 refills | Status: DC

## 2018-02-26 MED ORDER — CYCLOBENZAPRINE HCL 10 MG PO TABS: 10.0000 mg | ORAL_TABLET | Freq: Every day | ORAL | 0 refills | Status: DC

## 2018-02-26 NOTE — Patient Instructions (Signed)
Acute Back Pain, Adult  Acute back pain is sudden and usually short-lived. It is often caused by an injury to the muscles and tissues in the back. The injury may result from:   A muscle or ligament getting overstretched or torn (strained). Ligaments are tissues that connect bones to each other. Lifting something improperly can cause a back strain.   Wear and tear (degeneration) of the spinal disks. Spinal disks are circular tissue that provides cushioning between the bones of the spine (vertebrae).   Twisting motions, such as while playing sports or doing yard work.   A hit to the back.   Arthritis.  You may have a physical exam, lab tests, and imaging tests to find the cause of your pain. Acute back pain usually goes away with rest and home care.  Follow these instructions at home:  Managing pain, stiffness, and swelling   Take over-the-counter and prescription medicines only as told by your health care provider.   Your health care provider may recommend applying ice during the first 24-48 hours after your pain starts. To do this:  ? Put ice in a plastic bag.  ? Place a towel between your skin and the bag.  ? Leave the ice on for 20 minutes, 2-3 times a day.   If directed, apply heat to the affected area as often as told by your health care provider. Use the heat source that your health care provider recommends, such as a moist heat pack or a heating pad.  ? Place a towel between your skin and the heat source.  ? Leave the heat on for 20-30 minutes.  ? Remove the heat if your skin turns bright red. This is especially important if you are unable to feel pain, heat, or cold. You have a greater risk of getting burned.  Activity     Do not stay in bed. Staying in bed for more than 1-2 days can delay your recovery.   Sit up and stand up straight. Avoid leaning forward when you sit, or hunching over when you stand.  ? If you work at a desk, sit close to it so you do not need to lean over. Keep your chin tucked  in. Keep your neck drawn back, and keep your elbows bent at a right angle. Your arms should look like the letter "L."  ? Sit high and close to the steering wheel when you drive. Add lower back (lumbar) support to your car seat, if needed.   Take short walks on even surfaces as soon as you are able. Try to increase the length of time you walk each day.   Do not sit, drive, or stand in one place for more than 30 minutes at a time. Sitting or standing for long periods of time can put stress on your back.   Do not drive or use heavy machinery while taking prescription pain medicine.   Use proper lifting techniques. When you bend and lift, use positions that put less stress on your back:  ? Bend your knees.  ? Keep the load close to your body.  ? Avoid twisting.   Exercise regularly as told by your health care provider. Exercising helps your back heal faster and helps prevent back injuries by keeping muscles strong and flexible.   Work with a physical therapist to make a safe exercise program, as recommended by your health care provider. Do any exercises as told by your physical therapist.  Lifestyle   Maintain   a healthy weight. Extra weight puts stress on your back and makes it difficult to have good posture.   Avoid activities or situations that make you feel anxious or stressed. Stress and anxiety increase muscle tension and can make back pain worse. Learn ways to manage anxiety and stress, such as through exercise.  General instructions   Sleep on a firm mattress in a comfortable position. Try lying on your side with your knees slightly bent. If you lie on your back, put a pillow under your knees.   Follow your treatment plan as told by your health care provider. This may include:  ? Cognitive or behavioral therapy.  ? Acupuncture or massage therapy.  ? Meditation or yoga.  Contact a health care provider if:   You have pain that is not relieved with rest or medicine.   You have increasing pain going down  into your legs or buttocks.   Your pain does not improve after 2 weeks.   You have pain at night.   You lose weight without trying.   You have a fever or chills.  Get help right away if:   You develop new bowel or bladder control problems.   You have unusual weakness or numbness in your arms or legs.   You develop nausea or vomiting.   You develop abdominal pain.   You feel faint.  Summary   Acute back pain is sudden and usually short-lived.   Use proper lifting techniques. When you bend and lift, use positions that put less stress on your back.   Take over-the-counter and prescription medicines and apply heat or ice as directed by your health care provider.  This information is not intended to replace advice given to you by your health care provider. Make sure you discuss any questions you have with your health care provider.  Document Released: 02/06/2005 Document Revised: 09/13/2017 Document Reviewed: 09/20/2016  Elsevier Interactive Patient Education  2019 [employer]

## 2018-02-26 NOTE — Progress Notes (Signed)
Subjective:  Patient ID: Michele Trevino, female    DOB: February 03, 1976  Age: 43 y.o. MRN: [id number]  CC: lower back pain  HPI Michele Trevino is a 43 y.o. female with a medical history of CTS and PID presents as a new patient with lower back pain. Went to ED three days ago with complaint of bilateral lower back pain, bilateral pelvic/inguinal pain, and burning/stabbing sensation to her gluteal cleft and anus. CT abdomen was performed which revealed, "No acute abnormality within the abdomen or pelvis. Uterus appears adherent to anterior abdominal wall in region of prior C-section. Mild bilateral L4-L5 facet arthropathy". Pt says she has been going to the gym and performing sit ups to help strengthen her core and thinks this may have contributed to her back pain. Has taken prednisone 50 mg one tablet daily x5 days with reduction in pain. Could not tolerate Naproxen 500 mg due to gastric upset. Took Robaxin with little relief. Has applied hot and cold compress to her back with little relief. Feels tightness of the paraspinals in the lower T spine.       Outpatient Medications Prior to Visit  Medication Sig Dispense Refill  . albuterol (PROVENTIL HFA;VENTOLIN HFA) 108 (90 Base) MCG/ACT inhaler Inhale 1-2 puffs into the lungs every 6 (six) hours as needed for wheezing or shortness of breath.    . methocarbamol (ROBAXIN) 500 MG tablet Take 1 tablet (500 mg total) by mouth every 8 (eight) hours as needed. 21 tablet 0  . predniSONE (DELTASONE) 50 MG tablet Take 1 tablet (50 mg total) by mouth daily with breakfast. 5 tablet 0  . naproxen (NAPROSYN) 500 MG tablet Take 1 tablet (500 mg total) by mouth 2 (two) times daily. (Patient not taking: Reported on 02/26/2018) 14 tablet 0  . lidocaine (LIDODERM) 5 % Place 1 patch onto the skin daily. Remove & Discard patch within 12 hours or as directed by MD 30 patch 0   No facility-administered medications prior to visit.      ROS Review of  Systems  Constitutional: Negative for chills, fever and malaise/fatigue.  Eyes: Negative for blurred vision.  Respiratory: Negative for shortness of breath.   Cardiovascular: Negative for chest pain and palpitations.  Gastrointestinal: Negative for abdominal pain and nausea.  Genitourinary: Negative for dysuria and hematuria.  Musculoskeletal: Positive for back pain. Negative for joint pain and myalgias.  Skin: Negative for rash.  Neurological: Negative for tingling and headaches.  Psychiatric/Behavioral: Negative for depression. The patient is not nervous/anxious.     Objective:  BP 133/80 (BP Location: Left Arm, Patient Position: Sitting, Cuff Size: Large)   Pulse 78   Temp 97.9 F (36.6 C) (Oral)   Ht 5\' 1"  (1.549 m)   Wt 213 lb 6.4 oz (96.8 kg)   LMP 02/08/2018 (Exact Date)   SpO2 96%   BMI 40.32 kg/m   BP/Weight 02/26/2018 02/23/2018 16/10/4501  Systolic BP 888 280 034  Diastolic BP 80 67 70  Wt. (Lbs) 213.4 - 207  BMI 40.32 - 39.11      Physical Exam Vitals signs reviewed.  Constitutional:      Comments: Well developed, obese, NAD, polite  HENT:     Head: Normocephalic and atraumatic.  Eyes:     General: No scleral icterus. Neck:     Musculoskeletal: Normal range of motion and neck supple.     Thyroid: No thyromegaly.  Cardiovascular:     Rate and Rhythm: Normal rate and regular rhythm.  Heart sounds: Normal heart sounds.  Pulmonary:     Effort: Pulmonary effort is normal.     Breath sounds: Normal breath sounds.  Abdominal:     General: Bowel sounds are normal.     Palpations: Abdomen is soft.     Tenderness: There is no abdominal tenderness.  Musculoskeletal:     Comments: Spasm of the paraspinals of the lower T spine. TTP at L4-L5. Exaggerated lordosis of the lumbar spine. Full forward flexion of lower back. Limited extension of lower back with pain elicited in L4 region. Normal rotation and lateral flexion bilaterally.  Skin:    General: Skin is warm  and dry.     Coloration: Skin is not pale.     Findings: No erythema or rash.  Neurological:     Mental Status: She is alert and oriented to person, place, and time.  Psychiatric:        Behavior: Behavior normal.        Thought Content: Thought content normal.      Assessment & Plan:   1. Acute bilateral low back pain without sciatica - DG Lumbar Spine Complete; Future - cyclobenzaprine (FLEXERIL) 10 MG tablet; Take 1 tablet (10 mg total) by mouth at bedtime.  Dispense: 10 tablet; Refill: 0 - acetaminophen-codeine (TYLENOL #3) 300-30 MG tablet; Take 1 tablet by mouth every 8 (eight) hours as needed for up to 7 days for moderate pain.  Dispense: 21 tablet; Refill: 0 - Ambulatory referral to Physical Therapy - meloxicam (MOBIC) 15 MG tablet; Take 1 tablet (15 mg total) by mouth daily.  Dispense: 10 tablet; Refill: 0  2. Paresthesia - DG Lumbar Spine Complete; Future - cyclobenzaprine (FLEXERIL) 10 MG tablet; Take 1 tablet (10 mg total) by mouth at bedtime.  Dispense: 10 tablet; Refill: 0 - acetaminophen-codeine (TYLENOL #3) 300-30 MG tablet; Take 1 tablet by mouth every 8 (eight) hours as needed for up to 7 days for moderate pain.  Dispense: 21 tablet; Refill: 0 - Ambulatory referral to Physical Therapy - meloxicam (MOBIC) 15 MG tablet; Take 1 tablet (15 mg total) by mouth daily.  Dispense: 10 tablet; Refill: 0  3. Muscle spasm of back - DG Lumbar Spine Complete; Future - cyclobenzaprine (FLEXERIL) 10 MG tablet; Take 1 tablet (10 mg total) by mouth at bedtime.  Dispense: 10 tablet; Refill: 0 - acetaminophen-codeine (TYLENOL #3) 300-30 MG tablet; Take 1 tablet by mouth every 8 (eight) hours as needed for up to 7 days for moderate pain.  Dispense: 21 tablet; Refill: 0 - Ambulatory referral to Physical Therapy - meloxicam (MOBIC) 15 MG tablet; Take 1 tablet (15 mg total) by mouth daily.  Dispense: 10 tablet; Refill: 0   Meds ordered this encounter  Medications  . cyclobenzaprine  (FLEXERIL) 10 MG tablet    Sig: Take 1 tablet (10 mg total) by mouth at bedtime.    Dispense:  10 tablet    Refill:  0    Order Specific Question:   Supervising Provider    Answer:   Charlott Rakes [[id number]]  . acetaminophen-codeine (TYLENOL #3) 300-30 MG tablet    Sig: Take 1 tablet by mouth every 8 (eight) hours as needed for up to 7 days for moderate pain.    Dispense:  21 tablet    Refill:  0    Order Specific Question:   Supervising Provider    Answer:   Charlott Rakes [[id number]]  . meloxicam (MOBIC) 15 MG tablet    Sig: Take 1  tablet (15 mg total) by mouth daily.    Dispense:  10 tablet    Refill:  0    Order Specific Question:   Supervising Provider    Answer:   Charlott Rakes [[id number]]    Follow-up: Return in about 4 weeks (around 03/26/2018) for back pain.   Clent Demark PA

## 2018-03-04 ENCOUNTER — Ambulatory Visit (HOSPITAL_COMMUNITY)
Admission: RE | Admit: 2018-03-04 | Discharge: 2018-03-04 | Disposition: A | Discharge status: Home / Self Care | Payer: No Typology Code available for payment source | Source: Ambulatory Visit | Attending: Physician Assistant | Admitting: Physician Assistant

## 2018-03-04 DIAGNOSIS — M545 Low back pain, unspecified: Secondary | ICD-10-CM

## 2018-03-04 DIAGNOSIS — R202 Paresthesia of skin: Secondary | ICD-10-CM | POA: Insufficient documentation

## 2018-03-04 DIAGNOSIS — M62830 Muscle spasm of back: Secondary | ICD-10-CM | POA: Insufficient documentation

## 2018-03-05 ENCOUNTER — Telehealth (INDEPENDENT_AMBULATORY_CARE_PROVIDER_SITE_OTHER): Payer: Self-pay

## 2018-03-05 NOTE — Telephone Encounter (Signed)
Left voicemail informing patient that Lumbar xray shows some degenerative changes or mild osteo. We recommend conservative measures right now such as weight loss, exercise (water aerobics), massage, physical therapy, and medications such as ibuprofen or naproxen unless you have an intolerance to these medications. Nat Christen, CMA

## 2018-03-05 NOTE — Telephone Encounter (Signed)
-----   Message from Gildardo Pounds, NP sent at 03/04/2018  2:48 PM EST ----- Lumbar xray shows some degenerative changes or mild osteo. We recommend conservative measures right now such as weight loss, exercise (water aerobics), massage, physical therapy, and medications such as ibuprofen or naproxen unless you have an intolerance to these medications.

## 2018-03-20 ENCOUNTER — Ambulatory Visit: Payer: Self-pay | Attending: Family Medicine

## 2018-03-26 ENCOUNTER — Encounter (INDEPENDENT_AMBULATORY_CARE_PROVIDER_SITE_OTHER): Payer: Self-pay | Admitting: Family Medicine

## 2018-03-26 ENCOUNTER — Ambulatory Visit (INDEPENDENT_AMBULATORY_CARE_PROVIDER_SITE_OTHER): Payer: Self-pay | Admitting: Family Medicine

## 2018-03-26 ENCOUNTER — Other Ambulatory Visit: Payer: Self-pay

## 2018-03-26 VITALS — BP 115/80 | HR 84 | Temp 98.1°F | Ht 61.0 in | Wt 211.8 lb

## 2018-03-26 DIAGNOSIS — M545 Low back pain, unspecified: Secondary | ICD-10-CM

## 2018-03-26 DIAGNOSIS — R319 Hematuria, unspecified: Secondary | ICD-10-CM

## 2018-03-26 DIAGNOSIS — M62830 Muscle spasm of back: Secondary | ICD-10-CM

## 2018-03-26 DIAGNOSIS — Z23 Encounter for immunization: Secondary | ICD-10-CM

## 2018-03-26 DIAGNOSIS — R350 Frequency of micturition: Secondary | ICD-10-CM

## 2018-03-26 DIAGNOSIS — R1030 Lower abdominal pain, unspecified: Secondary | ICD-10-CM

## 2018-03-26 LAB — POCT URINALYSIS DIP (CLINITEK)
Bilirubin, UA: NEGATIVE
Glucose, UA: NEGATIVE mg/dL
Ketones, POC UA: NEGATIVE mg/dL
Leukocytes, UA: NEGATIVE
Nitrite, UA: NEGATIVE
POC PROTEIN,UA: NEGATIVE
Spec Grav, UA: 1.01
Urobilinogen, UA: 0.2 U/dL
pH, UA: 7

## 2018-03-26 MED ORDER — MELOXICAM 15 MG PO TABS: 15.0000 mg | ORAL_TABLET | Freq: Every day | ORAL | 0 refills | Status: DC

## 2018-03-26 MED ORDER — ACETAMINOPHEN-CODEINE #3 300-30 MG PO TABS: ORAL_TABLET | ORAL | 0 refills | Status: DC

## 2018-03-26 MED ORDER — CYCLOBENZAPRINE HCL 10 MG PO TABS: 10.0000 mg | ORAL_TABLET | Freq: Every day | ORAL | 0 refills | Status: DC

## 2018-03-26 NOTE — Progress Notes (Signed)
Subjective:    Patient ID: Michele Trevino, female    DOB: 1975-10-15, 43 y.o.   MRN: [id number]  HPI       43 yo female who is seen due to the complaint of recurrent and sometimes chronic low back pain/muscle spasms since January of this year.  Patient also with complaint of a few weeks of lower abdominal pain which is dull and achy.  Patient states that in January she had onset of severe back pain and muscle spasms in her back and patient states that she could hardly walk after the onset of the symptoms.  Patient reports that she was seen in the emergency department and that the medications did not really help that she was prescribed for her back pain.  Patient reports that right now at today's visit she does not have any back pain but the back pain can range between 8 and a 10 when it occurs in her back pain is sharp and stabbing.  Patient was then seen here in the office on 02/26/2018 and patient states that the medication she was given did help with her back pain.  Patient however states that last night she took her last ibuprofen and Tylenol 3 and she states that this completely eliminated her pain.  Patient would like to receive a refill of Tylenol 3.  Patient additionally reports that she has had recent onset of increased urinary frequency since last month.  Patient however reports that she is also drinking more water.  She also feels as if she has some urgency as when she gets a sensation that she has to urinate then she really has to go or she feels as if she will wet herself before she can get to the restroom.  Patient reports that her lower abdominal pain/discomfort and crampy sensation is about a 5 on a 0-to-10 scale.   Patient denies any prior injury to her back.  Patient denies any dysuria.  Patient denies fever or chills.  Patient denies any numbness or tingling in her feet, no blurred vision, no increased thirst.  Patient states that she does have numbness in her hands from carpal  tunnel syndrome.  Past Medical History:  Diagnosis Date  . Arthritis   . Carpal tunnel syndrome    Past Surgical History:  Procedure Laterality Date  . CESAREAN SECTION     Family History  Problem Relation Age of Onset  . Throat cancer Mother   . Diabetes Father   . Hypertension Father    Social History   Tobacco Use  . Smoking status: Former Research scientist (life sciences)  . Smokeless tobacco: Never Used  Substance Use Topics  . Alcohol use: Yes    Frequency: Never    Comment: socially   . Drug use: No   Allergies  Allergen Reactions  . Latex Swelling    Current Outpatient Medications:  .  albuterol (PROVENTIL HFA;VENTOLIN HFA) 108 (90 Base) MCG/ACT inhaler, Inhale 1-2 puffs into the lungs every 6 (six) hours as needed for wheezing or shortness of breath., Disp: , Rfl:      Review of Systems  Constitutional: Positive for fatigue. Negative for chills and fever.  Respiratory: Negative for cough and shortness of breath.   Cardiovascular: Negative for chest pain, palpitations and leg swelling.  Gastrointestinal: Positive for abdominal pain. Negative for constipation, diarrhea and nausea.  Endocrine: Negative for polydipsia and polyphagia.  Genitourinary: Positive for frequency. Negative for dysuria.  Musculoskeletal: Positive for back pain and gait problem (  with acute pain and spasm in her lower back).  Neurological: Negative for dizziness and headaches.  Hematological: Negative for adenopathy. Does not bruise/bleed easily.       Objective:   Physical Exam BP 115/80 (BP Location: Left Arm, Patient Position: Sitting, Cuff Size: Large)   Pulse 84   Temp 98.1 F (36.7 C) (Oral)   Ht 5\' 1"  (1.549 m)   Wt 211 lb 12.8 oz (96.1 kg)   LMP 03/04/2018 (Exact Date)   SpO2 98%   BMI 40.02 kg/m Nurse's notes and vital signs reviewed General-well-nourished, well-developed overweight for height female in no acute distress Neck-supple, no lymphadenopathy Cardiovascular-regular rate and regular  rhythm Abdomen- normal bowel sounds, truncal obesity, patient with abdominal distention, patient with tenderness to palpation along the lower abdomen especially suprapubic area Back-patient with bilateral CVA tenderness right greater than left.  Patient with midline lower back pain in the lumbosacral area.  Negative seated leg raise.  Patient with thoracolumbar paraspinous. Extremities-no edema      Assessment & Plan:  1. Lower abdominal pain Patient with lower abdominal discomfort on examination and complaint of recent urinary frequency.  On patient's CT scan during ED visit on 02/23/2018, patient had CT scan which showed that the uterus was adherent to the anterior abdominal wall in the region of her prior C-section scar.  Patient will have urinalysis to look for urinary tract infection but will also refer to gynecology in follow-up of her lower abdominal discomfort and surgical adhesions on CT.  Patient had some hematuria on urinalysis and patient's urine will be sent for urine culture and patient will be notified if further treatment is needed based on the results. - POCT URINALYSIS DIP (CLINITEK) - Ambulatory referral to Gynecology - Urine Culture  2. Acute bilateral low back pain without sciatica Patient with acute bilateral low back pain without radiation that has been present since January.  Prescription provided for Flexeril 10 mg to take at bedtime as needed for muscle spasm.  Patient was referred to physical therapy at her last visit and she is encouraged to keep this appointment.  Patient given refill of Mobic as she states that this has helped with her back pain better than other medications.  Patient given temporary 5-day supply of Tylenol 3 and patient was made aware that this is not considered a long-term medication and she will not be provided with future refills. - cyclobenzaprine (FLEXERIL) 10 MG tablet; Take 1 tablet (10 mg total) by mouth at bedtime. As needed for muscle spasms   Dispense: 30 tablet; Refill: 0 - meloxicam (MOBIC) 15 MG tablet; Take 1 tablet (15 mg total) by mouth daily. As needed for pain; take after eating  Dispense: 30 tablet; Refill: 0 - acetaminophen-codeine (TYLENOL #3) 300-30 MG tablet; 1 pill up to twice per day as needed for severe pain x 5 days  Dispense: 10 tablet; Refill: 0  3. Spasm of muscle of lower back Patient with muscle spasms of her lower back.  Patient is encouraged to keep follow-up with physical therapy and prescription provided for Flexeril 10 mg to take at bedtime as needed for muscle spasm. - cyclobenzaprine (FLEXERIL) 10 MG tablet; Take 1 tablet (10 mg total) by mouth at bedtime. As needed for muscle spasms  Dispense: 30 tablet; Refill: 0  4. Urinary frequency Patient with urinary frequency and lower abdominal pain.  Urinalysis will be done to look for abnormalities which may indicate presence of urinary tract infection. - POCT URINALYSIS DIP (CLINITEK)  5. Hematuria, unspecified type Patient with hematuria on urinalysis and patient's urine will be sent for culture to look for presence of infection.  Patient will be notified of the culture results and if any further treatment is needed based on these results - Urine Culture  6. Need for Tdap vaccination Patient with need for tetanus vaccination which was offered to the patient by CMA and patient agreed to have this immunization at today's visit and patient also received educational information regarding Tdap - Tdap vaccine greater than or equal to 7yo IM  An After Visit Summary was printed and given to the patient.  Allergies as of 03/26/2018      Reactions   Latex Swelling      Medication List       Accurate as of March 26, 2018  3:44 PM. Always use your most recent med list.        acetaminophen-codeine 300-30 MG tablet Commonly known as:  TYLENOL #3 1 pill up to twice per day as needed for severe pain x 5 days   albuterol 108 (90 Base) MCG/ACT  inhaler Commonly known as:  PROVENTIL HFA;VENTOLIN HFA Inhale 1-2 puffs into the lungs every 6 (six) hours as needed for wheezing or shortness of breath.   cyclobenzaprine 10 MG tablet Commonly known as:  FLEXERIL Take 1 tablet (10 mg total) by mouth at bedtime. As needed for muscle spasms   meloxicam 15 MG tablet Commonly known as:  MOBIC Take 1 tablet (15 mg total) by mouth daily. As needed for pain; take after eating      Return in about 2 weeks (around 04/09/2018) for back pain.

## 2018-03-28 ENCOUNTER — Encounter: Payer: Self-pay | Admitting: Obstetrics & Gynecology

## 2018-03-28 ENCOUNTER — Ambulatory Visit (INDEPENDENT_AMBULATORY_CARE_PROVIDER_SITE_OTHER): Payer: Self-pay | Admitting: Obstetrics & Gynecology

## 2018-03-28 VITALS — BP 128/87 | HR 91 | Wt 213.9 lb

## 2018-03-28 DIAGNOSIS — M545 Low back pain, unspecified: Secondary | ICD-10-CM

## 2018-03-28 DIAGNOSIS — N941 Unspecified dyspareunia: Secondary | ICD-10-CM

## 2018-03-28 LAB — URINE CULTURE

## 2018-03-28 NOTE — Patient Instructions (Signed)
Pelvic Pain, Female Pelvic pain is pain in your lower abdomen, below your belly button and between your hips. The pain may start suddenly (be acute), keep coming back (be recurring), or last a long time (become chronic). Pelvic pain that lasts longer than 6 months is considered chronic. Pelvic pain may affect your:  Reproductive organs.  Urinary system.  Digestive tract.  Musculoskeletal system. There are many potential causes of pelvic pain. Sometimes, the pain can be a result of digestive or urinary conditions, strained muscles or ligaments, or reproductive conditions. Sometimes the cause of pelvic pain is not known. Follow these instructions at home:   Take over-the-counter and prescription medicines only as told by your health care provider.  Rest as told by your health care provider.  Do not have sex if it hurts.  Keep a journal of your pelvic pain. Write down: ? When the pain started. ? Where the pain is located. ? What seems to make the pain better or worse, such as food or your period (menstrual cycle). ? Any symptoms you have along with the pain.  Keep all follow-up visits as told by your health care provider. This is important. Contact a health care provider if:  Medicine does not help your pain.  Your pain comes back.  You have new symptoms.  You have abnormal vaginal discharge or bleeding, including bleeding after menopause.  You have a fever or chills.  You are constipated.  You have blood in your urine or stool.  You have foul-smelling urine.  You feel weak or light-headed. Get help right away if:  You have sudden severe pain.  Your pain gets steadily worse.  You have severe pain along with fever, nausea, vomiting, or excessive sweating.  You lose consciousness. Summary  Pelvic pain is pain in your lower abdomen, below your belly button and between your hips.  There are many potential causes of pelvic pain.  Keep a journal of your pelvic  pain. This information is not intended to replace advice given to you by your health care provider. Make sure you discuss any questions you have with your health care provider. Document Released: 01/04/2004 Document Revised: 07/25/2017 Document Reviewed: 07/25/2017 Elsevier Interactive Patient Education  2019 [employer]  

## 2018-03-28 NOTE — Progress Notes (Signed)
History:  43 y.o. [id number] here today for f/u of back pain. [id number] LMP 03/04/2018. She was seen in the ED in Jan for eval of back pain.  She was told by primary care that her back pain may be related to her uterus begin stuck to her abd wall. Pt reports some cycles are painful maybe every other cycle. Some cycles are rough lik when she first started her menses.   Her Nov cycle was extremely painful in Nov and Dec her flow was heavier. Her cycle last 3-4 days. The pain usually comes the day before and leave the next day. Pt has had 3 c/s. Pt does report painful intercourse for a prolonged time.     Pt reports back pain that has been present since 02/24/2018. She felt that the pain extends to her pelvis, She was seated ang got up and turned and felt a sharp pain and felt like knives in her back. That pain is now gone after meds. The pain will leave for 2 days and then return. She is taking pain meds. She is scheduled to f/u at [facility] for eval.      The following portions of the patient's history were reviewed and updated as appropriate: allergies, current medications, past family history, past medical history, past social history, past surgical history and problem list.  Review of Systems:  Pertinent items are noted in HPI.    Objective:  Physical Exam Blood pressure 128/87, pulse 91, weight 213 lb 14.4 oz (97 kg), last menstrual period 03/04/2018.  CONSTITUTIONAL: Well-developed, well-nourished female in no acute distress.  HENT:  Normocephalic, atraumatic EYES: Conjunctivae and EOM are normal. No scleral icterus.  NECK: Normal range of motion SKIN: Skin is warm and dry. No rash noted. Not diaphoretic.No pallor. [place]: Alert and oriented to person, place, and time. Normal coordination.  Abd: Soft, nontender and nondistended. Well healed incision that is directy onver the symphysis pubis Pelvic: Normal appearing external genitalia; normal appearing vaginal mucosa and cervix.   Normal discharge.  Small uterus that is anteflexed it does not appear to be mobile. There is tnderness to manipulation of the uterus but, it is not the same pain as the back pain she reported to primary care and the ED. No other  Masses noted.  No uterine or adnexal tenderness  Labs and Imaging Dg Lumbar Spine Complete  Result Date: 03/04/2018 CLINICAL DATA:  Low back pain EXAM: LUMBAR SPINE - COMPLETE 4+ VIEW COMPARISON:  None. FINDINGS: Five lumbar type vertebral bodies are well visualized. Vertebral body height is well maintained. No pars defects are seen. Very minimal facet hypertrophic changes are noted. Mild osteophytes are noted as well. No anterolisthesis is seen. No soft tissue abnormality is noted. IMPRESSION: Mild degenerative change without acute abnormality. Electronically Signed   By: Inez Catalina M.D.   On: 03/04/2018 09:16   02/23/2018 CLINICAL DATA:  42 year old female with thoracic back and pelvic pain since yesterday  EXAM: CT ABDOMEN AND PELVIS WITH CONTRAST  TECHNIQUE: Multidetector CT imaging of the abdomen and pelvis was performed using the standard protocol following bolus administration of intravenous contrast.  CONTRAST:  126mL ISOVUE-300 IOPAMIDOL (ISOVUE-300) INJECTION 61%  COMPARISON:  Prior CT scan of the abdomen and pelvis 05/14/2017 and 09/01/2005  FINDINGS: Lower chest: 5 mm subpleural pulmonary nodule in the medial aspect of the right lower lobe. No significant interval change compared to 05/14/2017. The visualized lower lungs are otherwise clear. The visualized cardiac structures are normal in appearance  as is the distal thoracic esophagus.  Hepatobiliary: Normal hepatic contour and morphology. No discrete hepatic lesions. Normal appearance of the gallbladder. No intra or extrahepatic biliary ductal dilatation.  Pancreas: Unremarkable. No pancreatic ductal dilatation or surrounding inflammatory changes.  Spleen: Normal in size without  focal abnormality.  Adrenals/Urinary Tract: Adrenal glands are unremarkable. Kidneys are normal, without renal calculi, focal lesion, or hydronephrosis. Bladder is unremarkable.  Stomach/Bowel: No evidence of obstruction or focal bowel wall thickening. Normal appendix in the right lower quadrant. The terminal ileum is unremarkable.  Vascular/Lymphatic: No significant vascular findings are present. No enlarged abdominal or pelvic lymph nodes.  Reproductive: Similar appearance of the uterus which is adherent to the anterior abdominal wall in the region of a prior Caesarean section scar. No focal adnexal mass.  Other: No abdominal wall hernia or abnormality. No abdominopelvic ascites.  Musculoskeletal: No acute fracture or aggressive appearing lytic or blastic osseous lesion. Mild bilateral L4-L5 facet arthropathy.  IMPRESSION: 1. No acute abnormality within the abdomen or pelvis. 2. Similar appearance of the uterus which appears adherent to the anterior abdominal wall in the region of a prior Caesarean section scar. 3. Mild bilateral L4-L5 facet arthropathy.   Assessment & Plan:  Back pain   this does not appear to be related to any GYN pathology  Dyspareunia   this is VERY likely related to the adhesion of the uterus to the ant abd wall. This is not likely to be relieved without a hyst as just releasing the adhesions would usually only be a temporary fix.     Pt completed financial aid paperwork    Total face-to-face time with patient was 30 min.  Greater than 50% was spent in counseling and coordination of care with the patient.   Hartlyn Reigel L. Harraway-Smith, M.D., Cherlynn June

## 2018-03-29 ENCOUNTER — Telehealth: Payer: Self-pay | Admitting: *Deleted

## 2018-03-29 NOTE — Telephone Encounter (Signed)
Patient verified DOB Patient is aware of urine showing now growth and being normal. No further questions.

## 2018-03-29 NOTE — Telephone Encounter (Signed)
-----   Message from Antony Blackbird, MD sent at 03/28/2018 11:31 PM EST ----- Urine culture showed mixed urogenital flora

## 2018-04-01 ENCOUNTER — Telehealth: Payer: Self-pay | Admitting: Physician Assistant

## 2018-04-01 NOTE — Telephone Encounter (Signed)
LVM to Pt since PATIENT HAS RETIREMENT ACCOUNT NEED SUMMARY OF ACCOUNT WILL HOLD FOR 14 DAYS  This was their 3rd quarter statement. They should have one for the 4th quarter of the year

## 2018-04-05 ENCOUNTER — Encounter (HOSPITAL_COMMUNITY): Payer: Self-pay | Admitting: Emergency Medicine

## 2018-04-05 ENCOUNTER — Ambulatory Visit (HOSPITAL_COMMUNITY)
Admission: EM | Admit: 2018-04-05 | Discharge: 2018-04-05 | Disposition: A | Discharge status: Home / Self Care | Payer: No Typology Code available for payment source | Attending: Family Medicine | Admitting: Family Medicine

## 2018-04-05 DIAGNOSIS — N390 Urinary tract infection, site not specified: Secondary | ICD-10-CM | POA: Insufficient documentation

## 2018-04-05 LAB — POCT URINALYSIS DIP (DEVICE)
Bilirubin Urine: NEGATIVE
Glucose, UA: 100 mg/dL — AB
KETONES UR: NEGATIVE mg/dL
Nitrite: POSITIVE — AB
Protein, ur: 30 mg/dL — AB
Specific Gravity, Urine: 1.015 (ref 1.005–1.030)
Urobilinogen, UA: 1 mg/dL (ref 0.0–1.0)
pH: 7 (ref 5.0–8.0)

## 2018-04-05 MED ORDER — CEPHALEXIN 500 MG PO CAPS: 500.0000 mg | ORAL_CAPSULE | Freq: Four times a day (QID) | ORAL | 0 refills | Status: DC

## 2018-04-05 NOTE — ED Triage Notes (Signed)
Pt c/o dysuria since yesterday, states she has a UTI.

## 2018-04-05 NOTE — ED Provider Notes (Signed)
[facility]    CSN: [id number] Arrival date & time: 04/05/18  0801     History   Chief Complaint Chief Complaint  Patient presents with  . Dysuria    HPI Michele Trevino is a 43 y.o. female.   This a 43 year old woman who complains of dysuria for the last 24 hours.  She is on her period and has noted some blood but she is not sure if it is coming from the urine or her menses.  Patient gets 2-3 urinary tract infections each year.  She has ongoing low back pain from arthritis, but there is been no increase in the pain.  No fever, nausea, abdominal pain, or vomiting.     Past Medical History:  Diagnosis Date  . Arthritis   . Carpal tunnel syndrome     Patient Active Problem List   Diagnosis Date Noted  . 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 OBESITY, NOS 04/19/2006  . BLEEDING, RECTAL 04/19/2006    Past Surgical History:  Procedure Laterality Date  . CESAREAN SECTION      OB History    Gravida  4   Para      Term      Preterm      AB      Living  4     SAB      TAB      Ectopic      Multiple      Live Births  4            Home Medications    Prior to Admission medications   Medication Sig Start Date End Date Taking? Authorizing Provider  acetaminophen-codeine (TYLENOL #3) 300-30 MG tablet 1 pill up to twice per day as needed for severe pain x 5 days 03/26/18   Fulp, Cammie, MD  albuterol (PROVENTIL HFA;VENTOLIN HFA) 108 (90 Base) MCG/ACT inhaler Inhale 1-2 puffs into the lungs every 6 (six) hours as needed for wheezing or shortness of breath.    [provider]  cephALEXin (KEFLEX) 500 MG capsule Take 1 capsule (500 mg total) by mouth 4 (four) times daily. 04/05/18   Robyn Haber, MD  cyclobenzaprine (FLEXERIL) 10 MG tablet Take 1 tablet (10 mg total) by mouth at bedtime. As needed for muscle spasms 03/26/18   Fulp, Cammie, MD  meloxicam (MOBIC) 15 MG tablet Take 1 tablet (15 mg total) by mouth daily. As needed for  pain; take after eating 03/26/18   Antony Blackbird, MD    Family History Family History  Problem Relation Age of Onset  . Throat cancer Mother   . Diabetes Father   . Hypertension Father     Social History Social History   Tobacco Use  . Smoking status: Former Research scientist (life sciences)  . Smokeless tobacco: Never Used  Substance Use Topics  . Alcohol use: Yes    Frequency: Never    Comment: socially   . Drug use: No     Allergies   Latex   Review of Systems Review of Systems   Physical Exam Triage Vital Signs ED Triage Vitals  Enc Vitals Group     BP 04/05/18 0812 134/84     Pulse Rate 04/05/18 0812 83     Resp 04/05/18 0812 18     Temp 04/05/18 0812 98.5 F (36.9 C)     Temp src --      SpO2 04/05/18 0812 98 %     Weight --  Height --      Head Circumference --      Peak Flow --      Pain Score 04/05/18 0814 0     Pain Loc --      Pain Edu? --      Excl. in [place]? --    No data found.  Updated Vital Signs BP 134/84   Pulse 83   Temp 98.5 F (36.9 C)   Resp 18   LMP 04/02/2018   SpO2 98%    Physical Exam Vitals signs and nursing note reviewed.  Constitutional:      Appearance: Normal appearance.  Pulmonary:     Effort: Pulmonary effort is normal.  Musculoskeletal: Normal range of motion.  Skin:    General: Skin is warm.  Neurological:     General: No focal deficit present.     Mental Status: She is alert.      UC Treatments / Results  Labs (all labs ordered are listed, but only abnormal results are displayed) Labs Reviewed  POCT URINALYSIS DIP (DEVICE) - Abnormal; Notable for the following components:      Result Value   Glucose, UA 100 (*)    Hgb urine dipstick LARGE (*)    Protein, ur 30 (*)    Nitrite POSITIVE (*)    Leukocytes,Ua SMALL (*)    All other components within normal limits  URINE CULTURE    EKG None  Radiology No results found.  Procedures Procedures (including critical care time)  Medications Ordered in UC Medications  - No data to display  Initial Impression / Assessment and Plan / UC Course  I have reviewed the triage vital signs and the nursing notes.  Pertinent labs & imaging results that were available during my care of the patient were reviewed by me and considered in my medical decision making (see chart for details).    Final Clinical Impressions(s) / UC Diagnoses   Final diagnoses:  Lower urinary tract infectious disease     Discharge Instructions     Since you are getting these infections more than 2 times a year, please discuss getting a urology consultation with your primary care provider.  There are ways to prevent getting urinary tract infections.    ED Prescriptions    Medication Sig Dispense Auth. Provider   cephALEXin (KEFLEX) 500 MG capsule Take 1 capsule (500 mg total) by mouth 4 (four) times daily. 20 capsule Robyn Haber, MD     Controlled Substance Prescriptions [place] Controlled Substance Registry consulted? Not Applicable   Robyn Haber, MD 04/05/18 [phone]

## 2018-04-05 NOTE — Discharge Instructions (Addendum)
Since you are getting these infections more than 2 times a year, please discuss getting a urology consultation with your primary care provider.  There are ways to prevent getting urinary tract infections.

## 2018-04-07 LAB — URINE CULTURE: Culture: 50000 — AB

## 2018-04-08 ENCOUNTER — Telehealth (HOSPITAL_COMMUNITY): Payer: Self-pay | Admitting: Emergency Medicine

## 2018-04-08 NOTE — Telephone Encounter (Signed)
Urine culture was positive for e coli and was given keflex at urgent care visit. Pt contacted and made aware, educated on completing antibiotic and to follow up if symptoms are persistent. Verbalized understanding.    

## 2018-04-16 ENCOUNTER — Other Ambulatory Visit: Payer: Self-pay

## 2018-04-16 ENCOUNTER — Encounter (INDEPENDENT_AMBULATORY_CARE_PROVIDER_SITE_OTHER): Payer: Self-pay | Admitting: Primary Care

## 2018-04-16 ENCOUNTER — Ambulatory Visit (INDEPENDENT_AMBULATORY_CARE_PROVIDER_SITE_OTHER): Payer: Self-pay | Admitting: Primary Care

## 2018-04-16 VITALS — BP 129/90 | HR 72 | Temp 97.7°F | Ht 61.0 in | Wt 207.4 lb

## 2018-04-16 DIAGNOSIS — E66813 Obesity, class 3: Secondary | ICD-10-CM

## 2018-04-16 DIAGNOSIS — Z6839 Body mass index (BMI) 39.0-39.9, adult: Secondary | ICD-10-CM

## 2018-04-16 DIAGNOSIS — R1030 Lower abdominal pain, unspecified: Secondary | ICD-10-CM

## 2018-04-16 DIAGNOSIS — M62830 Muscle spasm of back: Secondary | ICD-10-CM

## 2018-04-16 DIAGNOSIS — R350 Frequency of micturition: Secondary | ICD-10-CM

## 2018-04-16 NOTE — Progress Notes (Addendum)
Acute Office Visit  Subjective:    Patient ID: Michele Trevino, female    DOB: [birth year], 43 y.o.   MRN: [id number]  Chief Complaint  Patient presents with  . Follow-up    back pain   Patient is in today for back pain started on February 23, 2018. She has a 45 y/o that she sometimes picks up and hold. Her son weighs 45 lbs. 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 Repetitive motion  maybe the cause will refer to PT to eval and tx. Past medical hx  of CTS , PID and mild bilateral L4-L5 facet arthropathy Past Medical History:  Diagnosis Date  . Arthritis   . Carpal tunnel syndrome     Past Surgical History:  Procedure Laterality Date  . CESAREAN SECTION      Family History  Problem Relation Age of Onset  . Throat cancer Mother   . Diabetes Father   . Hypertension Father     Social History   Socioeconomic History  . Marital status: Married    Spouse name: Not on file  . Number of children: Not on file  . Years of education: Not on file  . Highest education level: Not on file  Occupational History  . Not on file  Social Needs  . Financial resource strain: Not on file  . Food insecurity:    Worry: Sometimes true    Inability: Sometimes true  . Transportation needs:    Medical: No    Non-medical: No  Tobacco Use  . Smoking status: Former Research scientist (life sciences)  . Smokeless tobacco: Never Used  Substance and Sexual Activity  . Alcohol use: Yes    Frequency: Never    Comment: socially   . Drug use: No  . Sexual activity: Yes    Birth control/protection: Surgical  Lifestyle  . Physical activity:    Days per week: Not on file    Minutes per session: Not on file  . Stress: Not on file  Relationships  . Social connections:    Talks on phone: Not on file    Gets together: Not on file    Attends religious service: Not on file    Active member of club or organization: Not on file    Attends meetings of clubs  or organizations: Not on file    Relationship status: Not on file  . Intimate partner violence:    Fear of current or ex partner: Not on file    Emotionally abused: Not on file    Physically abused: Not on file    Forced sexual activity: Not on file  Other Topics Concern  . Not on file  Social History Narrative  . Not on file    Outpatient Medications Prior to Visit  Medication Sig Dispense Refill  . albuterol (PROVENTIL HFA;VENTOLIN HFA) 108 (90 Base) MCG/ACT inhaler Inhale 1-2 puffs into the lungs every 6 (six) hours as needed for wheezing or shortness of breath.    Marland Kitchen acetaminophen-codeine (TYLENOL #3) 300-30 MG tablet 1 pill up to twice per day as needed for severe pain x 5 days 10 tablet 0  . cephALEXin (KEFLEX) 500 MG capsule Take 1 capsule (500 mg total) by mouth 4 (four) times daily. 20 capsule 0  . cyclobenzaprine (FLEXERIL) 10 MG tablet Take 1 tablet (10 mg total) by mouth at bedtime. As needed for muscle spasms 30 tablet 0  .  meloxicam (MOBIC) 15 MG tablet Take 1 tablet (15 mg total) by mouth daily. As needed for pain; take after eating 30 tablet 0   No facility-administered medications prior to visit.     Allergies  Allergen Reactions  . Latex Swelling    Review of Systems  Constitutional: Negative.   HENT: Negative.   Eyes: Negative.   Respiratory: Negative.   Cardiovascular: Negative.   Gastrointestinal: Negative.   Genitourinary: Positive for frequency and urgency.  Musculoskeletal: Positive for back pain.  Skin: Negative.   Neurological: Negative.   Endo/Heme/Allergies: Negative.   Psychiatric/Behavioral: Negative.        Objective:    Physical Exam  Constitutional: She appears well-developed and well-nourished.  HENT:  Head: Normocephalic.  Eyes: Pupils are equal, round, and reactive to light. EOM are normal.  Neck: Normal range of motion. Neck supple.  Cardiovascular: Normal rate and regular rhythm.  Pulmonary/Chest: Effort normal and breath  sounds normal.  Abdominal: Soft. Bowel sounds are normal.  Musculoskeletal: Normal range of motion.  Neurological: She is alert.  Skin: Skin is warm and dry.  Psychiatric: She has a normal mood and affect.    BP 129/90 (BP Location: Left Arm, Patient Position: Sitting, Cuff Size: Large)   Pulse 72   Temp 97.7 F (36.5 C) (Oral)   Ht 5\' 1"  (1.549 m)   Wt 207 lb 6.4 oz (94.1 kg)   LMP 04/02/2018   SpO2 97%   BMI 39.19 kg/m  Wt Readings from Last 3 Encounters:  04/16/18 207 lb 6.4 oz (94.1 kg)  03/28/18 213 lb 14.4 oz (97 kg)  03/26/18 211 lb 12.8 oz (96.1 kg)    Health Maintenance Due  Topic Date Due  . HIV Screening  09/10/1988    There are no preventive care reminders to display for this patient.   No results found for: TSH Lab Results  Component Value Date   WBC 10.3 02/23/2018   HGB 10.0 (L) 02/23/2018   HCT 32.9 (L) 02/23/2018   MCV 81.4 02/23/2018   PLT 508 (H) 02/23/2018   Lab Results  Component Value Date   NA 138 02/23/2018   K 4.4 02/23/2018   CO2 24 02/23/2018   GLUCOSE 113 (H) 02/23/2018   BUN 8 02/23/2018   CREATININE 0.86 02/23/2018   BILITOT 0.4 02/23/2018   ALKPHOS 41 02/23/2018   AST 22 02/23/2018   ALT 17 02/23/2018   PROT 7.6 02/23/2018   ALBUMIN 3.9 02/23/2018   CALCIUM 8.9 02/23/2018   ANIONGAP 7 02/23/2018   Lab Results  Component Value Date   CHOL 172 12/28/2017   Lab Results  Component Value Date   HDL 52 12/28/2017   Lab Results  Component Value Date   LDLCALC 106 (H) 12/28/2017   Lab Results  Component Value Date   TRIG 69 12/28/2017   Lab Results  Component Value Date   CHOLHDL 3.3 12/28/2017   Lab Results  Component Value Date   HGBA1C 5.7 (H) 12/28/2017       Assessment & Plan:   Problem List Items Addressed This Visit    None    Michele Trevino was seen today for follow-up.  Diagnoses and all orders for this visit:  Urinary frequency Taught how to do kegal exercises, denies burning or hematuria   Lower  abdominal pain -     Ambulatory referral to Physical Therapy  Muscle spasm of back -     Ambulatory referral to Physical Therapy  Class 3  severe obesity due to excess calories in adult, unspecified BMI, unspecified whether serious comorbidity present ([place]) Discussed weight is a contributing factor of back pain. Discussed diet and exercise . She will be able to be more active when back pain is controlled     No orders of the defined types were placed in this encounter.    Kerin Perna, NP

## 2018-04-16 NOTE — Patient Instructions (Signed)

## 2018-04-22 ENCOUNTER — Telehealth (INDEPENDENT_AMBULATORY_CARE_PROVIDER_SITE_OTHER): Payer: Self-pay | Admitting: Primary Care

## 2018-04-22 NOTE — Telephone Encounter (Signed)
Patient called to inform that she is in a lot of pain she has tired the advise her PCP suggested to reduce the back pain but states it is not helping. Patient would like to know if PCP can prescribe anything for pain or does she need to be seen again since she came in on Feb 25.  Please advise [phone]   Patient uses [facility] on [address]  Thank you Emmit Pomfret

## 2018-04-23 NOTE — Telephone Encounter (Signed)
I will be glad to refer her to pain management

## 2018-04-23 NOTE — Telephone Encounter (Signed)
Please advise. Dion Sibal S Alixandria Friedt, CMA  

## 2018-04-24 ENCOUNTER — Ambulatory Visit: Payer: Self-pay | Attending: Primary Care | Admitting: Physical Therapy

## 2018-04-24 DIAGNOSIS — M545 Low back pain: Secondary | ICD-10-CM | POA: Insufficient documentation

## 2018-04-24 DIAGNOSIS — M62830 Muscle spasm of back: Secondary | ICD-10-CM | POA: Insufficient documentation

## 2018-04-24 DIAGNOSIS — G8929 Other chronic pain: Secondary | ICD-10-CM | POA: Insufficient documentation

## 2018-04-24 NOTE — Therapy (Signed)
[place], [place], [address] Phone: [phone]   Fax:  [phone]  Physical Therapy Evaluation  Patient Details  Name: Michele Trevino MRN: [id number] Date of Birth: 07-07-1975 Referring Provider (PT): Kerin Perna, NP   Encounter Date: 04/24/2018  PT End of Session - 04/24/18 1057    Visit Number  1    Number of Visits  12    Date for PT Re-Evaluation  06/05/18    Authorization Type  self pay, applying for CAFA    PT Start Time  0930    PT Stop Time  1023    PT Time Calculation (min)  53 min    Activity Tolerance  Patient tolerated treatment well    Behavior During Therapy  [facility] for tasks assessed/performed       Past Medical History:  Diagnosis Date  . Arthritis   . Carpal tunnel syndrome     Past Surgical History:  Procedure Laterality Date  . CESAREAN SECTION      There were no vitals filed for this visit.   Subjective Assessment - 04/24/18 0931    Subjective  Pt relays her back went out in Jan 20120 and she had to go to hospital for this. She had XR showing some mild degenerative changes and facet arthropathy, was precribed some meds and stretching and now referred to PT. She relays the meds and stretching have helped some and she no longer has radiculopathy past the buttocks but she still has severe back and buttock pain and that her back will lock up on her with standing or walking more than 5-10 minutes.     Pertinent History  ELF:YBOFBP,ZW,CHE,NIDPOEU, extensive lumbar lordosis    Limitations  Lifting;Standing;Walking;House hold activities    How long can you sit comfortably?  not limited    How long can you stand comfortably?  10 min    How long can you walk comfortably?  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  Yes     Pain Score  7     Pain Location  Back    Pain Orientation  Right;Left;Lower    Pain Descriptors / Indicators  Stabbing;Burning    Pain Type  Chronic pain    Pain Radiating Towards  both buttocks    Pain Onset  More than a month ago    Pain Frequency  Intermittent    Aggravating Factors   cant pinpoint anything    Pain Relieving Factors  meds,     Multiple Pain Sites  No         [facility] PT Assessment - 04/24/18 0001      Assessment   Medical Diagnosis  LBP and spasm, mild degenerative changes facet arthropathy L4-5, and lower abdominal pain    Referring Provider (PT)  Kerin Perna, NP    Onset Date/Surgical Date  --   January 2020 onset of pain   Next MD Visit  3 months or PRN    Prior Therapy  PT for knees      Precautions   Precautions  None      Restrictions   Weight Bearing Restrictions  No      Balance Screen   Has the patient fallen in the past 6  months  No      Home Film/video editor residence    Additional Comments  2 steps that bother her      Prior Function   Level of Independence  Independent with basic ADLs      Observation/Other Assessments   Focus on Therapeutic Outcomes (FOTO)   68% limited      Sensation   Light Touch  Appears Intact      Coordination   Gross Motor Movements are Fluid and Coordinated  Yes      Posture/Postural Control   Posture Comments  excessive lumbar lordosis with anterior pelvic tilt      ROM / Strength   AROM / PROM / Strength  AROM;Strength      AROM   AROM Assessment Site  Lumbar    Lumbar Flexion  50% pain and pulling    Lumbar Extension  75% pain and pulling    Lumbar - Right Side Bend  75% pulling    Lumbar - Left Side Bend  75% pulling    Lumbar - Right Rotation  75% pulling    Lumbar - Left Rotation  75% pulling      Strength   Overall Strength Comments  LE strength WNL, core strength poor      Flexibility   Soft Tissue Assessment /Muscle Length  --   tightness and spasm  in lumbar P.S, glutes, hip flexors     Palpation   Spinal mobility  difficult to assess due to pain with light pressure    Palpation comment  very TTP in lumbar spine and paraspinals      Transfers   Transfers  Independent with all Transfers      Ambulation/Gait   Gait Comments  WFL                Objective measurements completed on examination: See above findings.      [facility] Adult PT Treatment/Exercise - 04/24/18 0001      Modalities   Modalities  Electrical Stimulation;Moist Heat      Moist Heat Therapy   Number Minutes Moist Heat  15 Minutes    Moist Heat Location  Lumbar Spine      Electrical Stimulation   Electrical Stimulation Location  lumbar    Electrical Stimulation Action  IFC    Electrical Stimulation Parameters  tolreance, pt sitting    Electrical Stimulation Goals  Pain;Tone             PT Education - 04/24/18 1056    Education Details  HEP, POC, TENS    Person(s) Educated  Patient    Methods  Explanation;Demonstration;Verbal cues;Handout    Comprehension  Verbalized understanding;Need further instruction          PT Long Term Goals - 04/24/18 1107      PT LONG TERM GOAL #1   Title  Pt will be I and compliant with HEP. (6 weeks 06/05/18)    Status  New      PT LONG TERM GOAL #2   Title  Pt will improve lumbar ROM to WNL 80-100%. (6 weeks 06/05/18)    Status  New      PT LONG TERM GOAL #3   Title  Pt will be able to perform modified plank from knees for 30 sec to show improved core strength. (6 weeks 06/05/18)    Status  New      PT LONG TERM GOAL #4  Title  Pt will improve FOTO to less than 45% limited. (6 weeks 06/05/18)    Status  New      PT LONG TERM GOAL #5   Title  Pt will report less than 3/10 overall back pain with usual activity including taking care of her son, housework, and cooking, and going up/down her 2 steps at home. (6 weeks 06/05/18)    Status  New             Plan - 04/24/18 1059    Clinical  Impression Statement  Pt presents with LBP and spasm, mild degenerative changes facet arthropathy L4-5, and lower abdominal pain. She has poor posture with excessive lumbar lordosis and anterior pelvic tilt so she was started with hip flexion stretching, core stabilization, posterior pelvic tilts and lumbar stretching to begin. She has overall decresaed lumbar ROM, increased lumbar tightness and spasm, decreased core strength, decreased activity tolerance in standing, and increased pain. She will benefit from skilled PT to address her defecits.     Personal Factors and Comorbidities  Finances;Comorbidity 1    Comorbidities  UUV:OZDGUY,QI,HKV,QQVZDGL, extensive lumbar lordosis    Examination-Activity Limitations  Squat;Lift;Stairs;Bend;Stand;Caring for Others;Sleep    Examination-Participation Restrictions  Laundry;Shop;Cleaning;Meal Prep;Community Activity;Driving    Stability/Clinical Decision Making  Evolving/Moderate complexity    Clinical Decision Making  Moderate    Rehab Potential  Good    PT Frequency  2x / week   1-2 depending on finances   PT Duration  6 weeks    PT Treatment/Interventions  Aquatic Therapy;Cryotherapy;Electrical Stimulation;Iontophoresis 4mg /ml Dexamethasone;Moist Heat;Traction;Ultrasound;Gait training;Stair training;Therapeutic activities;Therapeutic exercise;Balance training;Neuromuscular re-education;Passive range of motion;Dry needling;Taping;Spinal Manipulations;Joint Manipulations    PT Next Visit Plan  review HEP, try to address anterior pelvic tilt, needs core strength and lumbar stretching, consider MT and modalies for pain, quadriped leg and arm raises    PT Home Exercise Plan  hip flexor stretch, piriormis stretch, child pose, clams, bridge, PPT,supine marches    Recommended Other Services  potentially aquatic therapy    Consulted and Agree with Plan of Care  Patient       Patient will benefit from skilled therapeutic intervention in order to improve the  following deficits and impairments:  Decreased activity tolerance, Decreased endurance, Decreased range of motion, Decreased strength, Increased muscle spasms, Increased fascial restricitons, Obesity, Postural dysfunction, Pain  Visit Diagnosis: Chronic bilateral low back pain without sciatica  Muscle spasm of back     Problem List Patient Active Problem List   Diagnosis Date Noted  . 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 OBESITY, NOS 04/19/2006  . BLEEDING, RECTAL 04/19/2006    [name] 04/24/2018, 11:13 AM  [facility] [address] [place], [place], [address] Phone: [phone]   Fax:  [phone]  Name: Michele Trevino MRN: [id number] Date of Birth: 02-03-1976

## 2018-04-24 NOTE — Patient Instructions (Addendum)
Access Code: [email or web address]  URL: [email or web address]  Date: 04/24/2018  Prepared by: Elsie Ra   Exercises  Hip Flexor Stretch at [facility] of Bed - 10 reps - 3 sets - 2x daily - 6x weekly  Supine Piriformis Stretch with Foot on Ground - 3 sets - 30 hold - 2x daily - 6x weekly  Child's Pose Stretch - 3 sets - 30 hold - 2x daily - 6x weekly  Supine Bridge - 10 reps - 2 sets - 5 hold - 2x daily - 6x weekly  Clamshell - 10 reps - 3 sets - 2x daily - 6x weekly  Supine Posterior Pelvic Tilt - 10 reps - 2 sets - 5 hold - 2x daily - 6x weekly  Supine March - 10 reps - 1-2 sets - 2x daily - 6x weekly  Beginner Front Arm Support - 10 reps - 3 sets - 2x daily - 6x weekly  Quadruped Alternating Shoulder Flexion - 10 reps - 3 sets - 2x daily - 6x weekly   TENS UNIT: This is helpful for muscle pain and spasm.   Search and Purchase a TENS 7000 2nd edition at www.tenspros.com. It should be less than $30.     TENS unit instructions: Do not shower or bathe with the unit on Turn the unit off before removing electrodes or batteries If the electrodes lose stickiness add a drop of water to the electrodes after they are disconnected from the unit and place on plastic sheet. If you continued to have difficulty, call the TENS unit company to purchase more electrodes. Do not apply lotion on the skin area prior to use. Make sure the skin is clean and dry as this will help prolong the life of the electrodes. After use, always check skin for unusual red areas, rash or other skin difficulties. If there are any skin problems, does not apply electrodes to the same area. Never remove the electrodes from the unit by pulling the wires. Do not use the TENS unit or electrodes other than as directed. Do not change electrode placement without consultating your therapist or physician. Keep 2 fingers with between each electrode. Wear time ratio is 2:1, on to off times.    For example on for 30 minutes off for 15  minutes and then on for 30 minutes off for 15 minutes

## 2018-04-24 NOTE — Telephone Encounter (Signed)
Spoke with patient. She has been seen by physical therapy and they gave her something. She no longer has pain. Nat Christen, CMA

## 2018-04-29 ENCOUNTER — Ambulatory Visit: Payer: Self-pay | Admitting: Physical Therapy

## 2018-04-29 DIAGNOSIS — M62830 Muscle spasm of back: Secondary | ICD-10-CM

## 2018-04-29 DIAGNOSIS — M545 Low back pain: Principal | ICD-10-CM

## 2018-04-29 DIAGNOSIS — G8929 Other chronic pain: Secondary | ICD-10-CM

## 2018-04-29 NOTE — Therapy (Signed)
[place], [place], [address] Phone: [phone]   Fax:  [phone]  Physical Therapy Treatment  Patient Details  Name: Michele Trevino MRN: [id number] Date of Birth: 1975-08-21 Referring Provider (PT): Kerin Perna, NP   Encounter Date: 04/29/2018  PT End of Session - 04/29/18 1546    Visit Number  2    Number of Visits  12    Date for PT Re-Evaluation  06/05/18    Authorization Type  self pay, applying for CAFA    PT Start Time  0305    PT Stop Time  0355    PT Time Calculation (min)  50 min    Activity Tolerance  Patient tolerated treatment well       Past Medical History:  Diagnosis Date  . Arthritis   . Carpal tunnel syndrome     Past Surgical History:  Procedure Laterality Date  . CESAREAN SECTION      There were no vitals filed for this visit.  Subjective Assessment - 04/29/18 1530    Subjective  Pt relays the HEP was hard but she did it, back is a having a better day today    Pertinent History  ZDG:LOVFIE,PP,IRJ,JOACZYS, extensive lumbar lordosis    Limitations  Lifting;Standing;Walking;House hold activities    How long can you sit comfortably?  not limited    How long can you stand comfortably?  10 min    How long can you walk comfortably?  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  Yes    Pain Score  3     Pain Location  Back    Pain Orientation  Right;Left    Pain Descriptors / Indicators  Aching;Tightness    Pain Type  Chronic pain    Pain Onset  More than a month ago                       [facility] Main Adult PT Treatment/Exercise - 04/29/18 0001      Exercises   Exercises  Lumbar      Lumbar Exercises: Stretches   Single Knee to Chest Stretch  Right;Left;3 reps;30 seconds    Pelvic Tilt  20 reps     Piriformis Stretch  Right;Left;3 reps;20 seconds    Other Lumbar Stretch Exercise  supine hip flexor stretch with leg off bed with strap 20 sec X 3 bilat    Other Lumbar Stretch Exercise  child pose 20 sec X 3      Lumbar Exercises: Aerobic   Nustep  5 min L4 UE/LE      Lumbar Exercises: Standing   Other Standing Lumbar Exercises  beginner hip hinge at wall X 10 then progresed to deadlifting P ball from floor X 10      Lumbar Exercises: Supine   Bent Knee Raise  20 reps    Bridge  15 reps;5 seconds      Modalities: MHP with TENS to lumbar spine in sitting IFC to tolerance to decrease pain and tightness.    PT Education - 04/29/18 1546    Education Details  HEP review    Person(s) Educated  Patient    Methods  Explanation;Verbal cues    Comprehension  Verbalized understanding;Returned demonstration  PT Long Term Goals - 04/24/18 1107      PT LONG TERM GOAL #1   Title  Pt will be I and compliant with HEP. (6 weeks 06/05/18)    Status  New      PT LONG TERM GOAL #2   Title  Pt will improve lumbar ROM to WNL 80-100%. (6 weeks 06/05/18)    Status  New      PT LONG TERM GOAL #3   Title  Pt will be able to perform modified plank from knees for 30 sec to show improved core strength. (6 weeks 06/05/18)    Status  New      PT LONG TERM GOAL #4   Title  Pt will improve FOTO to less than 45% limited. (6 weeks 06/05/18)    Status  New      PT LONG TERM GOAL #5   Title  Pt will report less than 3/10 overall back pain with usual activity including taking care of her son, housework, and cooking, and going up/down her 2 steps at home. (6 weeks 06/05/18)    Status  New            Plan - 04/29/18 1601    Clinical Impression Statement  Session focused on education for HEP review and general diet/exercise recommendations to assist weight loss. She was able to show good return demonstration with HEP with good tolerance and good technique. Then she was progressed to spinal  stabilization program and further progressed to beginning lifting mechanics starting with hip hinge at wall and progressing to lifitng P ball from floor with focus on form and no pain in the back. PT will continue to progress as able.     Personal Factors and Comorbidities  Finances;Comorbidity 1    Comorbidities  XNA:TFTDDU,KG,URK,YHCWCBJ, extensive lumbar lordosis    Stability/Clinical Decision Making  Evolving/Moderate complexity    Rehab Potential  Good    PT Frequency  2x / week    PT Duration  6 weeks    PT Treatment/Interventions  Aquatic Therapy;Cryotherapy;Electrical Stimulation;Iontophoresis 4mg /ml Dexamethasone;Moist Heat;Traction;Ultrasound;Gait training;Stair training;Therapeutic activities;Therapeutic exercise;Balance training;Neuromuscular re-education;Passive range of motion;Dry needling;Taping;Spinal Manipulations;Joint Manipulations    PT Next Visit Plan  try to address anterior pelvic tilt, needs core strength and lumbar stretching, consider MT and modalies for pain, quadriped leg and arm raises, progress functional lifting when able    PT Home Exercise Plan  hip flexor stretch, piriormis stretch, child pose, clams, bridge, PPT,supine marches    Consulted and Agree with Plan of Care  Patient       Patient will benefit from skilled therapeutic intervention in order to improve the following deficits and impairments:  Decreased activity tolerance, Decreased endurance, Decreased range of motion, Decreased strength, Increased muscle spasms, Increased fascial restricitons, Obesity, Postural dysfunction, Pain  Visit Diagnosis: Chronic bilateral low back pain without sciatica  Muscle spasm of back     Problem List Patient Active Problem List   Diagnosis Date Noted  . 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 OBESITY, NOS 04/19/2006  . BLEEDING, RECTAL 04/19/2006    Michele Trevino 04/29/2018, 4:06 PM  [facility] [address] [place], [place], [address] Phone: [phone]   Fax:  [phone]  Name: Michele Trevino MRN: [id number] Date of Birth: [birth year]

## 2018-05-01 ENCOUNTER — Other Ambulatory Visit: Payer: Self-pay

## 2018-05-01 ENCOUNTER — Encounter: Payer: Self-pay | Admitting: Physical Therapy

## 2018-05-01 ENCOUNTER — Ambulatory Visit: Payer: Self-pay | Admitting: Physical Therapy

## 2018-05-01 DIAGNOSIS — M62830 Muscle spasm of back: Secondary | ICD-10-CM

## 2018-05-01 DIAGNOSIS — M545 Low back pain: Principal | ICD-10-CM

## 2018-05-01 DIAGNOSIS — G8929 Other chronic pain: Secondary | ICD-10-CM

## 2018-05-01 NOTE — Therapy (Signed)
[place], [place], [address] Phone: [phone]   Fax:  [phone]  Physical Therapy Treatment  Patient Details  Name: Michele Trevino MRN: [id number] Date of Birth: 17-May-1975 Referring Provider (PT): Kerin Perna, NP   Encounter Date: 05/01/2018  PT End of Session - 05/01/18 0934    Visit Number  3    Number of Visits  12    Date for PT Re-Evaluation  06/05/18    Authorization Type  self pay, applying for CAFA    PT Start Time  0845    PT Stop Time  0940    PT Time Calculation (min)  55 min       Past Medical History:  Diagnosis Date  . Arthritis   . Carpal tunnel syndrome     Past Surgical History:  Procedure Laterality Date  . CESAREAN SECTION      There were no vitals filed for this visit.  Subjective Assessment - 05/01/18 0849    Subjective  Having sinus issues. Went to the gym this morning. DId the bike, T.m. and seated chest press.     Currently in Pain?  Yes    Pain Score  5     Pain Location  Back    Pain Orientation  Right;Left    Pain Descriptors / Indicators  Aching;Tightness    Aggravating Factors   prolonged positions, certain movements unable to pinpoint     Pain Relieving Factors  change positions, meds                        [facility] Adult PT Treatment/Exercise - 05/01/18 0001      Lumbar Exercises: Stretches   Pelvic Tilt  10 reps    Quadruped Mid Back Stretch  3 reps    Quadruped Mid Back Stretch Limitations  childs pose with laterals x 2 each way       Lumbar Exercises: Aerobic   Nustep  5 min L4 UE/LE      Lumbar Exercises: Machines for Strengthening   Leg Press  horizontal 2 plates    Other Lumbar Machine Exercise  seatd row 25#, mid and low x 15 each, lat pull 20# all with cues for posture      Lumbar Exercises: Standing   Other Standing Lumbar Exercises  squat/hip hinge touch table x10       Lumbar Exercises: Supine   Other  Supine Lumbar Exercises  partial sit up (slide hands up thighs) x 10       Moist Heat Therapy   Number Minutes Moist Heat  15 Minutes    Moist Heat Location  Lumbar Spine      Electrical Stimulation   Electrical Stimulation Location  lumbar    Electrical Stimulation Action  IFC    Electrical Stimulation Parameters  51ma    Electrical Stimulation Goals  Pain;Tone                  PT Long Term Goals - 04/24/18 1107      PT LONG TERM GOAL #1   Title  Pt will be I and compliant with HEP. (6 weeks 06/05/18)    Status  New      PT LONG TERM GOAL #2   Title  Pt will improve lumbar ROM to WNL 80-100%. (6 weeks 06/05/18)    Status  New      PT LONG TERM GOAL #3  Title  Pt will be able to perform modified plank from knees for 30 sec to show improved core strength. (6 weeks 06/05/18)    Status  New      PT LONG TERM GOAL #4   Title  Pt will improve FOTO to less than 45% limited. (6 weeks 06/05/18)    Status  New      PT LONG TERM GOAL #5   Title  Pt will report less than 3/10 overall back pain with usual activity including taking care of her son, housework, and cooking, and going up/down her 2 steps at home. (6 weeks 06/05/18)    Status  New            Plan - 05/01/18 0926    Clinical Impression Statement  Session focused on gym machines she should use and machines to avoid. She was very receptive to ideas. Progressed with squat to chair tap with focus on abdominal draw in and gluteal squeeze to decrease lordosis. Repeated IFC/HMP per pt request.     PT Next Visit Plan  try to address anterior pelvic tilt, needs core strength and lumbar stretching, consider MT and modalies for pain, quadriped leg and arm raises, progress functional lifting when able    PT Home Exercise Plan  hip flexor stretch, piriormis stretch, child pose, clams, bridge, PPT,supine marches       Patient will benefit from skilled therapeutic intervention in order to improve the following deficits and  impairments:  Decreased activity tolerance, Decreased endurance, Decreased range of motion, Decreased strength, Increased muscle spasms, Increased fascial restricitons, Obesity, Postural dysfunction, Pain  Visit Diagnosis: Chronic bilateral low back pain without sciatica  Muscle spasm of back     Problem List Patient Active Problem List   Diagnosis Date Noted  . 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 OBESITY, NOS 04/19/2006  . BLEEDING, RECTAL 04/19/2006    Dorene Ar, PTA 05/01/2018, 9:38 AM  [place], [place], [address] Phone: [phone]   Fax:  [phone]  Name: Michele Trevino MRN: [id number] Date of Birth: March 09, 1975

## 2018-05-06 ENCOUNTER — Encounter: Payer: No Typology Code available for payment source | Admitting: Physical Therapy

## 2018-05-06 ENCOUNTER — Ambulatory Visit: Payer: Self-pay | Admitting: Physical Therapy

## 2018-05-07 ENCOUNTER — Encounter: Payer: No Typology Code available for payment source | Admitting: Physical Therapy

## 2018-05-08 ENCOUNTER — Encounter: Payer: No Typology Code available for payment source | Admitting: Physical Therapy

## 2018-05-13 ENCOUNTER — Ambulatory Visit: Payer: Self-pay | Admitting: Physical Therapy

## 2018-05-15 ENCOUNTER — Ambulatory Visit: Payer: Self-pay | Admitting: Physical Therapy

## 2018-05-20 ENCOUNTER — Ambulatory Visit: Payer: Self-pay | Admitting: Physical Therapy

## 2018-05-22 ENCOUNTER — Ambulatory Visit: Payer: No Typology Code available for payment source | Admitting: Physical Therapy

## 2018-06-01 NOTE — Telephone Encounter (Signed)
done

## 2018-06-21 ENCOUNTER — Other Ambulatory Visit: Payer: Self-pay

## 2018-06-21 ENCOUNTER — Ambulatory Visit (HOSPITAL_COMMUNITY)
Admission: EM | Admit: 2018-06-21 | Discharge: 2018-06-21 | Disposition: A | Discharge status: Home / Self Care | Payer: No Typology Code available for payment source | Attending: Family Medicine | Admitting: Family Medicine

## 2018-06-21 DIAGNOSIS — M545 Low back pain, unspecified: Secondary | ICD-10-CM

## 2018-06-21 MED ORDER — KETOROLAC TROMETHAMINE 60 MG/2ML IM SOLN
60.0000 mg | Freq: Once | INTRAMUSCULAR | Status: AC
  Administered 2018-06-21: 60 mg via INTRAMUSCULAR

## 2018-06-21 MED ORDER — KETOROLAC TROMETHAMINE 60 MG/2ML IM SOLN
INTRAMUSCULAR | Status: AC
  Filled 2018-06-21: qty 2

## 2018-06-21 MED ORDER — HYDROCODONE-ACETAMINOPHEN 7.5-325 MG PO TABS: 1.0000 | ORAL_TABLET | Freq: Four times a day (QID) | ORAL | 0 refills | Status: DC | PRN

## 2018-06-21 MED ORDER — CYCLOBENZAPRINE HCL 10 MG PO TABS: 10.0000 mg | ORAL_TABLET | Freq: Two times a day (BID) | ORAL | 0 refills | Status: DC | PRN

## 2018-06-21 NOTE — ED Provider Notes (Signed)
[facility]    CSN: [id number] Arrival date & time: 06/21/18  1734     History   Chief Complaint Chief Complaint  Patient presents with  . Back Pain    HPI Michele Trevino is a 43 y.o. female.   HPI  Patient has back pain.  Is been going on for some time.  Today she states it is "severe".  She states that she is here for medicine refill.  She would like a refill of her Flexeril.  She also wonders if she can get pain medication.  I checked the narcotic database and she is only had 3 prescriptions over the last year, each for 20 or less.  She did get a prescription on 06/02/2018 which will be finished by now.  No history of addiction. Back pain is in the upper lumbar region.  No radiation.  She states she is bent over today and cannot stand up straight because her muscles are in "spasm".  No trauma or injury. She states her physician is aware of her back pain and has ordered physical therapy.  She is been unable to attend physical therapy because of the COVID-19 pandemic. She did have x-rays of her back within the last couple of months.  I reviewed them and there normal for age  Past Medical History:  Diagnosis Date  . Arthritis   . Carpal tunnel syndrome     Patient Active Problem List   Diagnosis Date Noted  . 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 OBESITY, NOS 04/19/2006  . BLEEDING, RECTAL 04/19/2006    Past Surgical History:  Procedure Laterality Date  . CESAREAN SECTION      OB History    Gravida  4   Para      Term      Preterm      AB      Living  4     SAB      TAB      Ectopic      Multiple      Live Births  4            Home Medications    Prior to Admission medications   Medication Sig Start Date End Date Taking? Authorizing Provider  albuterol (PROVENTIL HFA;VENTOLIN HFA) 108 (90 Base) MCG/ACT inhaler Inhale 1-2 puffs into the lungs every 6 (six) hours as needed for wheezing or shortness of breath.    [provider]  cyclobenzaprine (FLEXERIL) 10 MG tablet Take 1 tablet (10 mg total) by mouth 2 (two) times daily as needed for muscle spasms. 06/21/18   Raylene Everts, MD  HYDROcodone-acetaminophen ([place]) 7.5-325 MG tablet Take 1 tablet by mouth every 6 (six) hours as needed for moderate pain. 06/21/18   Raylene Everts, MD    Family History Family History  Problem Relation Age of Onset  . Throat cancer Mother   . Diabetes Father   . Hypertension Father     Social History Social History   Tobacco Use  . Smoking status: Former Research scientist (life sciences)  . Smokeless tobacco: Never Used  Substance Use Topics  . Alcohol use: Yes    Frequency: Never    Comment: socially   . Drug use: No     Allergies   Latex   Review of Systems Review of Systems  Constitutional: Negative for chills and fever.  HENT: Negative for ear pain and sore throat.   Eyes: Negative for pain and visual disturbance.  Respiratory:  Negative for cough and shortness of breath.   Cardiovascular: Negative for chest pain and palpitations.  Gastrointestinal: Negative for abdominal pain and vomiting.  Genitourinary: Negative for dysuria and hematuria.  Musculoskeletal: Positive for back pain. Negative for arthralgias.  Skin: Negative for color change and rash.  Neurological: Negative for seizures and syncope.  All other systems reviewed and are negative.    Physical Exam Triage Vital Signs ED Triage Vitals  Enc Vitals Group     BP 06/21/18 1759 138/83     Pulse Rate 06/21/18 1759 79     Resp 06/21/18 1759 16     Temp 06/21/18 1759 98.2 F (36.8 C)     Temp Source 06/21/18 1759 Oral     SpO2 06/21/18 1759 100 %     Weight --      Height --      Head Circumference --      Peak Flow --      Pain Score 06/21/18 1758 10     Pain Loc --      Pain Edu? --      Excl. in [place]? --    No data found.  Updated Vital Signs BP 138/83 (BP Location: Right Arm)   Pulse 79   Temp 98.2 F (36.8 C) (Oral)   Resp 16   LMP  05/31/2018   SpO2 100%   Visual Acuity Right Eye Distance:   Left Eye Distance:   Bilateral Distance:    Right Eye Near:   Left Eye Near:    Bilateral Near:     Physical Exam Constitutional:      General: She is in acute distress.     Appearance: She is well-developed. She is obese.     Comments: Appears uncomfortable, posture.  Small steps.  Guarded movements  HENT:     Head: Normocephalic and atraumatic.  Eyes:     Conjunctiva/sclera: Conjunctivae normal.     Pupils: Pupils are equal, round, and reactive to light.  Neck:     Musculoskeletal: Normal range of motion and neck supple. No muscular tenderness.  Cardiovascular:     Rate and Rhythm: Normal rate and regular rhythm.     Heart sounds: Normal heart sounds.  Pulmonary:     Effort: Pulmonary effort is normal. No respiratory distress.     Breath sounds: Normal breath sounds.  Abdominal:     General: There is no distension.     Palpations: Abdomen is soft.  Musculoskeletal: Normal range of motion.     Comments: Tenderness in lumbar muscles from L1-L4.  No palpable spasm.  Very limited range of motion.  Exaggerated response to palpation with vocalization and pulling away.  Strength sensation range of motion and reflexes are intact in both lower extremities with negative straight leg raise  Skin:    General: Skin is warm and dry.  Neurological:     Mental Status: She is alert.      UC Treatments / Results  Labs (all labs ordered are listed, but only abnormal results are displayed) Labs Reviewed - No data to display  EKG None  Radiology No results found.  Procedures Procedures (including critical care time)  Medications Ordered in UC Medications  ketorolac (TORADOL) injection 60 mg (60 mg Intramuscular Given 06/21/18 1834)    Initial Impression / Assessment and Plan / UC Course  I have reviewed the triage vital signs and the nursing notes.  Pertinent labs & imaging results that were available during my  care of the patient were reviewed by me and considered in my medical decision making (see chart for details).     I explained to patient why we are being cautious with narcotics.  We discussed the opioid epidemic.  I told her that I was authorized to give her a 5-day supply if I deemed it indicated.  I would not keep her on narcotic medications either.  I do feel comfortable refilling the Flexeril.  She has Mobic at home.  We discussed ice, heat, activity, massage Final Clinical Impressions(s) / UC Diagnoses   Final diagnoses:  Acute bilateral low back pain without sciatica     Discharge Instructions     Continue meloxicam 1 a day Continue Flexeril as needed for muscle spasms Take pain medicine when pain is severe.  Do not drive on pain medication.  This will not be refilled Use ice or heat to painful back muscles Activity as tolerated Call your family practice doctor on Monday   ED Prescriptions    Medication Sig Dispense Auth. Provider   cyclobenzaprine (FLEXERIL) 10 MG tablet Take 1 tablet (10 mg total) by mouth 2 (two) times daily as needed for muscle spasms. 30 tablet Raylene Everts, MD   HYDROcodone-acetaminophen [employer]) 7.5-325 MG tablet Take 1 tablet by mouth every 6 (six) hours as needed for moderate pain. 10 tablet Raylene Everts, MD     Controlled Substance Prescriptions [place] Controlled Substance Registry consulted? Yes, I have consulted the [place] Controlled Substances Registry for this patient, and feel the risk/benefit ratio today is favorable for proceeding with this prescription for a controlled substance.   Raylene Everts, MD 06/21/18 2040

## 2018-06-21 NOTE — ED Triage Notes (Signed)
Per pt she has been seeing a dr for muscle spasms and arthritis in her back and could not get into her dr bc could not get her in. Pt is having upper muscles spasms and can hardly walk. Pt cant bend or lift.

## 2018-06-21 NOTE — Discharge Instructions (Addendum)
Continue meloxicam 1 a day Continue Flexeril as needed for muscle spasms Take pain medicine when pain is severe.  Do not drive on pain medication.  This will not be refilled Use ice or heat to painful back muscles Activity as tolerated Call your family practice doctor on Monday

## 2018-06-25 ENCOUNTER — Ambulatory Visit: Payer: Self-pay | Attending: Primary Care | Admitting: Primary Care

## 2018-06-25 ENCOUNTER — Other Ambulatory Visit: Payer: Self-pay

## 2018-06-25 ENCOUNTER — Encounter: Payer: Self-pay | Admitting: Primary Care

## 2018-06-25 DIAGNOSIS — M545 Low back pain, unspecified: Secondary | ICD-10-CM

## 2018-06-25 DIAGNOSIS — M62838 Other muscle spasm: Secondary | ICD-10-CM

## 2018-06-25 DIAGNOSIS — K5900 Constipation, unspecified: Secondary | ICD-10-CM

## 2018-06-25 DIAGNOSIS — J302 Other seasonal allergic rhinitis: Secondary | ICD-10-CM

## 2018-06-25 MED ORDER — FLUTICASONE PROPIONATE 50 MCG/ACT NA SUSP: 2.0000 | Freq: Every day | NASAL | 6 refills | Status: DC

## 2018-06-25 MED ORDER — LORATADINE 10 MG PO TABS: 10.0000 mg | ORAL_TABLET | Freq: Every day | ORAL | 11 refills | Status: DC

## 2018-06-25 MED ORDER — ALBUTEROL SULFATE HFA 108 (90 BASE) MCG/ACT IN AERS: 1.0000 | INHALATION_SPRAY | Freq: Four times a day (QID) | RESPIRATORY_TRACT | 3 refills | Status: DC | PRN

## 2018-06-25 NOTE — Progress Notes (Signed)
Virtual Visit via Telephone Note  I connected with Michele Trevino on 06/25/18 at  2:10 PM EDT by telephone and verified that I am speaking with the correct person using two identifiers.   I discussed the limitations, risks, security and privacy concerns of performing an evaluation and management service by telephone and the availability of in person appointments. I also discussed with the patient that there may be a patient responsible charge related to this service. The patient expressed understanding and agreed to proceed.   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 Seen in ED bent over and unable to stand straight . She is feeling better states she stills has pain medication and flexeril and taken when needed.   Observations/Objective: BP 06/21/18 1759 138/83     Pulse Rate 06/21/18 1759 79     Resp 06/21/18 1759 16     Temp 06/21/18 1759 98.2 F (36.8 C)     Temp Source 06/21/18 1759 Oral     SpO2 06/21/18 1759 100 %   Retrieved from ED 06/21/2018 Review of Systems  Constitutional: Negative.   HENT: Negative.   Eyes: Negative.   Respiratory: Negative.   Cardiovascular: Negative.   Gastrointestinal: Negative.   Genitourinary: Negative.   Musculoskeletal: Positive for back pain and joint pain.  Skin: Negative.    Assessment and Plan: [name] was seen today for back pain.  Diagnoses and all orders for this visit:  Acute bilateral low back pain without sciatica She recently received a call that PT will be resuming . Excited and hopeful she will be getting better  Constipation, unspecified constipation type  OTC senokot   Muscle spasm Secondary to a stained muscle cont flexeril as neeeded  Seasonal allergies High levels of pollen sent in bronchodilator, antihistamine and  Flonase prn   Other orders -     albuterol (VENTOLIN HFA) 108 (90 Base) MCG/ACT inhaler; Inhale 1-2 puffs into the lungs every 6  (six) hours as needed for wheezing or shortness of breath.    Follow Up Instructions:    I discussed the assessment and treatment plan with the patient. The patient was provided an opportunity to ask questions and all were answered. The patient agreed with the plan and demonstrated an understanding of the instructions.   The patient was advised to call back or seek an in-person evaluation if the symptoms worsen or if the condition fails to improve as anticipated.  18 minutes of non-face-to-face time during this encounter. Includes reviewing the chart   Kerin Perna, NP

## 2018-06-25 NOTE — Progress Notes (Signed)
Patient verified DOB Patient has not taken medication.  Patient has eaten today. Patient complains of current lower back pain at a 6 currently the pain is described as achy. Patient also complains of burning pain in the middle of her shoulder blades

## 2018-07-02 ENCOUNTER — Other Ambulatory Visit: Payer: Self-pay

## 2018-07-02 ENCOUNTER — Ambulatory Visit: Payer: Self-pay | Attending: Primary Care | Admitting: Physical Therapy

## 2018-07-02 DIAGNOSIS — M62830 Muscle spasm of back: Secondary | ICD-10-CM | POA: Insufficient documentation

## 2018-07-02 DIAGNOSIS — M545 Low back pain: Secondary | ICD-10-CM | POA: Insufficient documentation

## 2018-07-02 DIAGNOSIS — G8929 Other chronic pain: Secondary | ICD-10-CM | POA: Insufficient documentation

## 2018-07-02 NOTE — Patient Instructions (Signed)
Access Code: [email or web address]  URL: [email or web address]  Date: 07/02/2018  Prepared by: Elsie Ra   Exercises  Sidelying Open Book Thoracic Lumbar Rotation and Extension - 10 reps - 1 sets - 5 hold - 2x daily - 6x weekly  Child's Pose Stretch - 3 sets - 30 hold - 2x daily - 6x weekly  Cat-Camel - 10 reps - 1 sets - 5 hold - 2x daily - 6x weekly  Seated Slump Nerve Glide - 10 reps - 1-2 sets - 3 hold - 2x daily - 6x weekly

## 2018-07-02 NOTE — Therapy (Addendum)
[facility] [place], [place], [address] Phone: [phone]   Fax:  [phone]  Physical Therapy Treatment/Re-Evaluation  Patient Details  Name: Michele Trevino MRN: [id number] Date of Birth: 1975-05-20 Referring Provider (PT): Kerin Perna, NP   Encounter Date: 07/02/2018  PT End of Session - 07/02/18 1459    Visit Number  4    Number of Visits  12    Date for PT Re-Evaluation  08/13/18    Authorization Type  CAFA 75%?    PT Start Time  1445    PT Stop Time  1530    PT Time Calculation (min)  45 min    Activity Tolerance  Patient tolerated treatment well    Behavior During Therapy  WFL for tasks assessed/performed       Past Medical History:  Diagnosis Date  . Arthritis   . Carpal tunnel syndrome     Past Surgical History:  Procedure Laterality Date  . CESAREAN SECTION      There were no vitals filed for this visit.  Subjective Assessment - 07/02/18 1455    Subjective  Pt relays her back is getting worse and she has had back spasms that are not getting better using heat and stretching so she had to go to urgent care last week due to pain. She was given some more meds and was referred back to PT. She says she has burning sensation in her back and Numbness in the right first 2 toes that comes and goes.     Pertinent History  PPJ:KDTOIZ,TI,WPY,KDXIPJA, extensive lumbar lordosis    Limitations  Lifting;Standing;Walking;House hold activities    How long can you sit comfortably?  10 min    How long can you stand comfortably?  15-20 min    How long can you walk comfortably?  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  Yes    Pain Score  5     Pain Location  Back    Pain Orientation  Right;Left;Mid    Pain Descriptors / Indicators   Aching;Spasm    Pain Type  Chronic pain    Pain Onset  More than a month ago    Pain Frequency  Intermittent    Aggravating Factors   prolonged standing and walking    Pain Relieving Factors  meds         [facility] PT Assessment - 07/02/18 0001      Assessment   Medical Diagnosis  LBP and spasm, mild degenerative changes facet arthropathy L4-5, and lower abdominal pain    Referring Provider (PT)  Kerin Perna, NP      Posture/Postural Control   Posture Comments  excessive lumbar lordosis with anterior pelvic tilt      AROM   Lumbar Flexion  50% pain and pulling    Lumbar Extension  75% pain and pulling    Lumbar - Right Side Bend  50%    Lumbar - Left Side Bend  505    Lumbar - Right Rotation  50%    Lumbar - Left Rotation  50%      Strength   Overall Strength Comments  LE strength WNL, core strength poor      Palpation   Palpation comment  very  TTP with increased tighntess and restrictions in lumbar spine and paraspinals                   [facility] Adult PT Treatment/Exercise - 07/02/18 0001      Lumbar Exercises: Stretches   Other Lumbar Stretch Exercise  sidelying lumbar-thoracic rotation stretch 5 sec X 10 each side, slump stretch seated 3 sec  x10 on Rt,     Other Lumbar Stretch Exercise  child pose stretch 30 sec  X3, cat/camel in quadriped 5 sec  x10 ea      Lumbar Exercises: Standing   Other Standing Lumbar Exercises  repeated lumbar ext X 10 no change in symptoms      Modalities   Modalities  Electrical Stimulation;Moist Heat      Moist Heat Therapy   Number Minutes Moist Heat  15 Minutes    Moist Heat Location  Lumbar Spine      Electrical Stimulation   Electrical Stimulation Location  lumbar    Electrical Stimulation Action  IFC    Electrical Stimulation Parameters  sitting to tolerance with heat    Electrical Stimulation Goals  Pain;Tone      Manual Therapy   Manual therapy comments  STM/IASTM to lumbar P.S, prone lying over one pillow  under hip, then sidelying lumbar rotational mobs and manipulation, LAQ mobs and manual KTC stretching             PT Education - 07/02/18 1547    Education Details  revised HEP    Person(s) Educated  Patient    Methods  Explanation;Demonstration;Verbal cues;Handout    Comprehension  Verbalized understanding;Returned demonstration;Need further instruction          PT Long Term Goals - 07/02/18 1501      PT LONG TERM GOAL #1   Title  Pt will be I and compliant with HEP. (6 weeks 06/05/18)    Baseline  relays not helping her back so revised today    Status  On-going      PT LONG TERM GOAL #2   Title  Pt will improve lumbar ROM to WNL 80-100%. (6 weeks 06/05/18)    Baseline  50% ROM currently    Status  On-going      PT LONG TERM GOAL #3   Title  Pt will be able to perform modified plank from knees for 30 sec to show improved core strength. (6 weeks 06/05/18)    Status  On-going      PT LONG TERM GOAL #4   Title  Pt will improve FOTO to less than 45% limited. (6 weeks 06/05/18)    Status  On-going      PT LONG TERM GOAL #5   Title  Pt will report less than 3/10 overall back pain with usual activity including taking care of her son, housework, and cooking, and going up/down her 2 steps at home. (6 weeks 06/05/18)    Baseline  overall 5-6/10 pain    Status  On-going            Plan - 07/02/18 1548    Clinical Impression Statement  Re evaluation and recert performed today as her POC is up and she has not been to PT since 05/01/18 due to covid 19 pandemic. She has increased back spasms and tighthness today limiting her ROM and abilities. She has not made much progress however she has not been able to attend PT. Session focused on revising HEP as her  last one was not helping. Manual therapy and MHP with TENS perfomed today to decrease pain and muscle restrictions in her back. She did have less pain and less antalgic gait pattern after session. PT is recommending recert for 4  more weeks of PT for in clinic visits to better manage her pain with modalaties, stretching, and manual therapy.     Rehab Potential  Good    PT Frequency  2x / week    PT Duration  4 weeks    PT Treatment/Interventions  Aquatic Therapy;Cryotherapy;Electrical Stimulation;Iontophoresis 4mg /ml Dexamethasone;Moist Heat;Traction;Ultrasound;Gait training;Stair training;Therapeutic activities;Therapeutic exercise;Balance training;Neuromuscular re-education;Passive range of motion;Dry needling;Taping;Spinal Manipulations;Joint Manipulations    PT Next Visit Plan  try to address anterior pelvic tilt, needs core strength and lumbar stretching, consider MT and modalies for pain    PT Home Exercise Plan  child pose, SL lumbar-T spine rotation stretch, cat/camel, slump stretch    Consulted and Agree with Plan of Care  Patient       Patient will benefit from skilled therapeutic intervention in order to improve the following deficits and impairments:  Decreased activity tolerance, Decreased endurance, Decreased range of motion, Decreased strength, Increased muscle spasms, Increased fascial restricitons, Obesity, Postural dysfunction, Pain  Visit Diagnosis: Chronic bilateral low back pain without sciatica  Muscle spasm of back     Problem List Patient Active Problem List   Diagnosis Date Noted  . 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 OBESITY, NOS 04/19/2006  . BLEEDING, RECTAL 04/19/2006    Debbe Odea, PT,DPT 07/02/2018, 4:06 PM  [facility] [address] [place], [place], [address] Phone: [phone]   Fax:  [phone]  Name: PEPPER KERRICK MRN: [id number] Date of Birth: [birth year]

## 2018-07-08 ENCOUNTER — Ambulatory Visit: Payer: Self-pay | Admitting: Physical Therapy

## 2018-07-10 ENCOUNTER — Ambulatory Visit: Payer: Self-pay | Admitting: Physical Therapy

## 2018-07-10 ENCOUNTER — Other Ambulatory Visit: Payer: Self-pay

## 2018-07-10 DIAGNOSIS — G8929 Other chronic pain: Secondary | ICD-10-CM

## 2018-07-10 DIAGNOSIS — M62830 Muscle spasm of back: Secondary | ICD-10-CM

## 2018-07-10 NOTE — Therapy (Signed)
[place], [place], [address] Phone: [phone]   Fax:  [phone]  Physical Therapy Treatment  Patient Details  Name: Michele Trevino MRN: [id number] Date of Birth: 06/23/1975 Referring Provider (PT): Kerin Perna, NP   Encounter Date: 07/10/2018  PT End of Session - 07/10/18 0936    Visit Number  5    Number of Visits  12    Date for PT Re-Evaluation  08/13/18    Authorization Type  CAFA 75%?    PT Start Time  0800    PT Stop Time  0900    PT Time Calculation (min)  60 min    Activity Tolerance  Patient tolerated treatment well    Behavior During Therapy  WFL for tasks assessed/performed       Past Medical History:  Diagnosis Date  . Arthritis   . Carpal tunnel syndrome     Past Surgical History:  Procedure Laterality Date  . CESAREAN SECTION      There were no vitals filed for this visit.  Subjective Assessment - 07/10/18 0813    Subjective  Pt relays back is still bad and it now wakes her up at night. She says she had numbness in the back of her knees and burning sensation around her bra strap area in mid back. She was encouraged to make another MD visit due to continued and progresed symptoms.     Pertinent History  YIF:OYDXAJ,OI,NOM,VEHMCNO, extensive lumbar lordosis    Limitations  Lifting;Standing;Walking;House hold activities    Currently in Pain?  Yes    Pain Score  9     Pain Location  Back    Pain Orientation  Mid;Lower    Pain Descriptors / Indicators  Aching;Burning    Pain Type  Chronic pain    Pain Radiating Towards  down to both posterior knees    Pain Onset  More than a month ago    Pain Frequency  Intermittent                       [facility] Adult PT Treatment/Exercise - 07/10/18 0001      Lumbar Exercises: Stretches   Other Lumbar Stretch Exercise  prone lying over one pillow under hips for 3 min, progressed to prone on elbow for 3  minutes, progressed to prone press ups  x10, this did help relieve radicular symptoms in legs some      Modalities   Modalities  Electrical Stimulation;Moist Heat;Traction      Moist Heat Therapy   Number Minutes Moist Heat  15 Minutes    Moist Heat Location  Lumbar Spine      Electrical Stimulation   Electrical Stimulation Location  lumbar    Electrical Stimulation Action  IFC    Electrical Stimulation Parameters  sitting to tolerance pre TX    Electrical Stimulation Goals  Pain;Tone      Traction   Type of Traction  Lumbar    Min (lbs)  35    Max (lbs)  55    Time  20 min preset hold/rest program             PT Education - 07/10/18 0935    Education Details  tractional rationale and for HEP to only focus on prone series of laying over pillow, POE, prone press ups, as other exercises were bothering her    Person(s) Educated  Patient    Methods  Explanation;Demonstration    Comprehension  Verbalized understanding;Returned demonstration          PT Long Term Goals - 07/02/18 1501      PT LONG TERM GOAL #1   Title  Pt will be I and compliant with HEP. (6 weeks 06/05/18)    Baseline  relays not helping her back so revised today    Status  On-going      PT LONG TERM GOAL #2   Title  Pt will improve lumbar ROM to WNL 80-100%. (6 weeks 06/05/18)    Baseline  50% ROM currently    Status  On-going      PT LONG TERM GOAL #3   Title  Pt will be able to perform modified plank from knees for 30 sec to show improved core strength. (6 weeks 06/05/18)    Status  On-going      PT LONG TERM GOAL #4   Title  Pt will improve FOTO to less than 45% limited. (6 weeks 06/05/18)    Status  On-going      PT LONG TERM GOAL #5   Title  Pt will report less than 3/10 overall back pain with usual activity including taking care of her son, housework, and cooking, and going up/down her 2 steps at home. (6 weeks 06/05/18)    Baseline  overall 5-6/10 pain    Status  On-going             Plan - 07/10/18 0937    Clinical Impression Statement  Pt continues to have have worsening back pain that his now waking her up at night. It was recommended she follow back up with MD as she is not improving much with PT in the long term, (she does get relief in session). Mechanical traction trialed today for spinal decompression and in efforts to reduce radiculopathy. This helped decrease symptoms in one leg but not the other. She was then taken through [facility] progression of lumbar extenson exercises in prone and this did help relieve the radicular symptoms in the other leg. She was instructed to only try this mckenzie progression at home as the other exercises may be aggravating her.     Rehab Potential  Good    PT Frequency  2x / week    PT Duration  4 weeks    PT Treatment/Interventions  Aquatic Therapy;Cryotherapy;Electrical Stimulation;Iontophoresis 4mg /ml Dexamethasone;Moist Heat;Traction;Ultrasound;Gait training;Stair training;Therapeutic activities;Therapeutic exercise;Balance training;Neuromuscular re-education;Passive range of motion;Dry needling;Taping;Spinal Manipulations;Joint Manipulations    PT Next Visit Plan  how did mckenzie program in prone go? did traction help any?    PT Home Exercise Plan  now only gave her prone lying over one pillow, then POE, then press ups    Consulted and Agree with Plan of Care  Patient       Patient will benefit from skilled therapeutic intervention in order to improve the following deficits and impairments:  Decreased activity tolerance, Decreased endurance, Decreased range of motion, Decreased strength, Increased muscle spasms, Increased fascial restricitons, Obesity, Postural dysfunction, Pain  Visit Diagnosis: Chronic bilateral low back pain without sciatica  Muscle spasm of back     Problem List Patient Active Problem List   Diagnosis Date Noted  . 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 OBESITY, NOS 04/19/2006  . BLEEDING, RECTAL  04/19/2006    [name] 07/10/2018, 9:53 AM  [facility] [address] [place], [place], [address] Phone: [phone]   Fax:  [phone]  Name: Michele  JENINA Trevino MRN: [id number] Date of Birth: [birth year]

## 2018-07-11 ENCOUNTER — Ambulatory Visit: Payer: Self-pay | Admitting: Physical Therapy

## 2018-07-11 DIAGNOSIS — M545 Low back pain, unspecified: Secondary | ICD-10-CM

## 2018-07-11 DIAGNOSIS — M62830 Muscle spasm of back: Secondary | ICD-10-CM

## 2018-07-11 DIAGNOSIS — G8929 Other chronic pain: Secondary | ICD-10-CM

## 2018-07-11 NOTE — Therapy (Signed)
[place], [place], [address] Phone: [phone]   Fax:  [phone]  Physical Therapy Treatment  Patient Details  Name: Michele Trevino MRN: [id number] Date of Birth: 12-01-1975 Referring Provider (PT): Kerin Perna, NP   Encounter Date: 07/11/2018  PT End of Session - 07/11/18 1109    Visit Number  6    Number of Visits  12    Date for PT Re-Evaluation  08/13/18    Authorization Type  CAFA 75%?    PT Start Time  0930    PT Stop Time  1025    PT Time Calculation (min)  55 min    Activity Tolerance  Patient tolerated treatment well    Behavior During Therapy  WFL for tasks assessed/performed       Past Medical History:  Diagnosis Date  . Arthritis   . Carpal tunnel syndrome     Past Surgical History:  Procedure Laterality Date  . CESAREAN SECTION      There were no vitals filed for this visit.  Subjective Assessment - 07/11/18 1006    Subjective  Pt realys some improvement after last session but last night she had to take some pain meds to sleep.    Pertinent History  FOY:DXAJOI,NO,MVE,HMCNOBS, extensive lumbar lordosis    Limitations  Lifting;Standing;Walking;House hold activities    How long can you sit comfortably?  10 min    How long can you stand comfortably?  15-20 min    How long can you walk comfortably?  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  Yes    Pain Score  5     Pain Location  Back    Pain Orientation  Mid;Lower    Pain Descriptors / Indicators  Aching;Tightness    Pain Type  Chronic pain                       [facility] Adult PT Treatment/Exercise - 07/11/18 0001      Lumbar Exercises: Stretches   Other Lumbar Stretch Exercise  prone lying over one pillow under hips for 3 min, progressed  to prone on elbow for 3 minutes, progressed to prone press ups 2 x10, this did help relieve radicular symptoms in legs some      Modalities   Modalities  Electrical Stimulation;Moist Heat;Traction      Moist Heat Therapy   Number Minutes Moist Heat  15 Minutes    Moist Heat Location  Lumbar Spine      Electrical Stimulation   Electrical Stimulation Location  lumbar    Electrical Stimulation Action  IFC    Electrical Stimulation Parameters  sitting to tolerance    Electrical Stimulation Goals  Pain;Tone      Traction   Type of Traction  Lumbar    Min (lbs)  40    Max (lbs)  65    Time  20 min preset hold/rest program          PT Long Term Goals - 07/02/18 1501      PT LONG TERM GOAL #1   Title  Pt will be I and compliant with HEP. (6 weeks 06/05/18)    Baseline  relays not helping her back so revised today  Status  On-going      PT LONG TERM GOAL #2   Title  Pt will improve lumbar ROM to WNL 80-100%. (6 weeks 06/05/18)    Baseline  50% ROM currently    Status  On-going      PT LONG TERM GOAL #3   Title  Pt will be able to perform modified plank from knees for 30 sec to show improved core strength. (6 weeks 06/05/18)    Status  On-going      PT LONG TERM GOAL #4   Title  Pt will improve FOTO to less than 45% limited. (6 weeks 06/05/18)    Status  On-going      PT LONG TERM GOAL #5   Title  Pt will report less than 3/10 overall back pain with usual activity including taking care of her son, housework, and cooking, and going up/down her 2 steps at home. (6 weeks 06/05/18)    Baseline  overall 5-6/10 pain    Status  On-going            Plan - 07/11/18 1109    Clinical Impression Statement  Pt had some improvements in irritabily of symptoms and activity tolerance today but continues to be limited due to back pain. She relays mechanical traction helped some last visit so this was continued but with increased pull up to 65 lbs today. Mckenzie prone progression also  continued and she was encouraged to perform this at home to reduce radiculopathy. Continue POC but still recommend she follow back up with MD    Rehab Potential  Good    PT Frequency  2x / week    PT Duration  4 weeks    PT Treatment/Interventions  Aquatic Therapy;Cryotherapy;Electrical Stimulation;Iontophoresis 4mg /ml Dexamethasone;Moist Heat;Traction;Ultrasound;Gait training;Stair training;Therapeutic activities;Therapeutic exercise;Balance training;Neuromuscular re-education;Passive range of motion;Dry needling;Taping;Spinal Manipulations;Joint Manipulations    PT Next Visit Plan  how did mckenzie program in prone go? did traction help any?    PT Home Exercise Plan  now only gave her prone lying over one pillow, then POE, then press ups    Consulted and Agree with Plan of Care  Patient       Patient will benefit from skilled therapeutic intervention in order to improve the following deficits and impairments:  Decreased activity tolerance, Decreased endurance, Decreased range of motion, Decreased strength, Increased muscle spasms, Increased fascial restricitons, Obesity, Postural dysfunction, Pain  Visit Diagnosis: Chronic bilateral low back pain without sciatica  Muscle spasm of back     Problem List Patient Active Problem List   Diagnosis Date Noted  . 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 OBESITY, NOS 04/19/2006  . BLEEDING, RECTAL 04/19/2006    Michele Trevino 07/11/2018, 11:34 AM  [facility] [address] [place], [place], [address] Phone: [phone]   Fax:  [phone]  Name: Michele Trevino MRN: [id number] Date of Birth: 1975-09-23

## 2018-07-16 ENCOUNTER — Ambulatory Visit: Payer: No Typology Code available for payment source | Admitting: Primary Care

## 2018-07-17 ENCOUNTER — Other Ambulatory Visit: Payer: Self-pay

## 2018-07-17 ENCOUNTER — Encounter: Payer: Self-pay | Admitting: Physical Therapy

## 2018-07-17 ENCOUNTER — Ambulatory Visit: Payer: Self-pay | Admitting: Physical Therapy

## 2018-07-17 DIAGNOSIS — G8929 Other chronic pain: Secondary | ICD-10-CM

## 2018-07-17 DIAGNOSIS — M62830 Muscle spasm of back: Secondary | ICD-10-CM

## 2018-07-17 NOTE — Therapy (Signed)
[place], [place], [address] Phone: [phone]   Fax:  [phone]  Physical Therapy Treatment  Patient Details  Name: Michele Trevino MRN: [id number] Date of Birth: [birth year] Referring Provider (PT): Kerin Perna, NP   Encounter Date: 07/17/2018  PT End of Session - 07/17/18 0801    Visit Number  7    Number of Visits  12    Date for PT Re-Evaluation  08/13/18    Authorization Type  CAFA 75%?    PT Start Time  0802    PT Stop Time  0850    PT Time Calculation (min)  48 min    Activity Tolerance  Patient tolerated treatment well    Behavior During Therapy  [facility] for tasks assessed/performed       Past Medical History:  Diagnosis Date  . Arthritis   . Carpal tunnel syndrome     Past Surgical History:  Procedure Laterality Date  . CESAREAN SECTION      There were no vitals filed for this visit.  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  No/denies    Pain Onset  More than a month ago    Aggravating Factors   unsure     Pain Relieving Factors  meds         [facility] PT Assessment - 07/17/18 0001      Assessment   Medical Diagnosis  LBP and spasm, mild degenerative changes facet arthropathy L4-5, and lower abdominal pain                   [facility] Adult PT Treatment/Exercise - 07/17/18 0001      Self-Care   Self-Care  Other Self-Care Comments    Other Self-Care Comments   MTPR along the thoracic spine using tennis ball      Lumbar Exercises: Stretches   Hip Flexor Stretch  3 reps;Right;30 seconds    Quadruped Mid Back Stretch  2 reps;30 seconds   childs pose     Lumbar Exercises: Aerobic   Nustep  5 min L5 UE/LE      Lumbar Exercises: Seated   Other Seated Lumbar Exercises  thoracic rotation 2 x 10 with  arms crossed gradually working into pain motion (to the R Rot)      Lumbar Exercises: Quadruped   [place]  10 reps   tactile cues for proper form     Moist Heat Therapy   Number Minutes Moist Heat  10 Minutes    Moist Heat Location  Lumbar Spine   seated     Manual Therapy   Manual Therapy  Muscle Energy Technique    Manual therapy comments  MTPR along L thoracic spine x 2   combined with education during tx   Muscle Energy Technique  scissor technique with R hip extension/ knee flexion 10 x hold 10              PT Education - 07/17/18 3491    Education Details  updated HEP for cat cow and MTPR     Person(s) Educated  Patient    Methods  Explanation;Verbal cues    Comprehension  Verbal cues required;Verbalized understanding          PT  Long Term Goals - 07/02/18 1501      PT LONG TERM GOAL #1   Title  Pt will be I and compliant with HEP. (6 weeks 06/05/18)    Baseline  relays not helping her back so revised today    Status  On-going      PT LONG TERM GOAL #2   Title  Pt will improve lumbar ROM to WNL 80-100%. (6 weeks 06/05/18)    Baseline  50% ROM currently    Status  On-going      PT LONG TERM GOAL #3   Title  Pt will be able to perform modified plank from knees for 30 sec to show improved core strength. (6 weeks 06/05/18)    Status  On-going      PT LONG TERM GOAL #4   Title  Pt will improve FOTO to less than 45% limited. (6 weeks 06/05/18)    Status  On-going      PT LONG TERM GOAL #5   Title  Pt will report less than 3/10 overall back pain with usual activity including taking care of her son, housework, and cooking, and going up/down her 2 steps at home. (6 weeks 06/05/18)    Baseline  overall 5-6/10 pain    Status  On-going            Plan - 07/17/18 0835    Clinical Impression Statement  pt denies pain reporting mostly stiffness in the mid to low back. pt exhibits increased lumbar lordosis as well as potential lower crossed syndrome.  Following hip flexor/ low back stretching she noted improvement in stiffness. continued working on throacic mobility and reviewed pain education.     PT Treatment/Interventions  Aquatic Therapy;Cryotherapy;Electrical Stimulation;Iontophoresis 4mg /ml Dexamethasone;Moist Heat;Traction;Ultrasound;Gait training;Stair training;Therapeutic activities;Therapeutic exercise;Balance training;Neuromuscular re-education;Passive range of motion;Dry needling;Taping;Spinal Manipulations;Joint Manipulations    PT Next Visit Plan  prone ext PRN, hip flexor stretching, low back stretching, STW, discuss DN, thoracic mobs?    PT Home Exercise Plan  now only gave her prone lying over one pillow, then POE, then press ups, MTPR using tennis ball    Consulted and Agree with Plan of Care  Patient       Patient will benefit from skilled therapeutic intervention in order to improve the following deficits and impairments:  Decreased activity tolerance, Decreased endurance, Decreased range of motion, Decreased strength, Increased muscle spasms, Increased fascial restricitons, Obesity, Postural dysfunction, Pain  Visit Diagnosis: Chronic bilateral low back pain without sciatica  Muscle spasm of back     Problem List Patient Active Problem List   Diagnosis Date Noted  . 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 OBESITY, NOS 04/19/2006  . BLEEDING, RECTAL 04/19/2006    Starr Lake PT, DPT, LAT, ATC  07/17/18  8:45 AM      [facility] [address] [place], [place], [address] Phone: [phone]   Fax:  [phone]  Name: Michele Trevino MRN: [id number] Date of Birth: 1975-10-07

## 2018-07-19 ENCOUNTER — Ambulatory Visit: Payer: Self-pay | Admitting: Physical Therapy

## 2018-07-19 ENCOUNTER — Other Ambulatory Visit: Payer: Self-pay

## 2018-07-19 ENCOUNTER — Encounter: Payer: Self-pay | Admitting: Physical Therapy

## 2018-07-19 DIAGNOSIS — M62830 Muscle spasm of back: Secondary | ICD-10-CM

## 2018-07-19 DIAGNOSIS — M545 Low back pain, unspecified: Secondary | ICD-10-CM

## 2018-07-19 DIAGNOSIS — G8929 Other chronic pain: Secondary | ICD-10-CM

## 2018-07-19 NOTE — Therapy (Addendum)
[place], [place], [address] Phone: [phone]   Fax:  [phone]  Physical Therapy Treatment  Patient Details  Name: Michele Trevino MRN: [id number] Date of Birth: [birth year] Referring Provider (PT): Kerin Perna, NP   Encounter Date: 07/19/2018  PT End of Session - 07/19/18 0750    Visit Number  8    Number of Visits  12    Date for PT Re-Evaluation  08/13/18    Authorization Type  CAFA 75%?    PT Start Time  0750    PT Stop Time  0845    PT Time Calculation (min)  55 min    Activity Tolerance  Patient tolerated treatment well    Behavior During Therapy  [facility] for tasks assessed/performed       Past Medical History:  Diagnosis Date  . Arthritis   . Carpal tunnel syndrome     Past Surgical History:  Procedure Laterality Date  . CESAREAN SECTION      There were no vitals filed for this visit.  Subjective Assessment - 07/19/18 0751    Subjective  "I felt pretty good after the last session. 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  Yes    Pain Score  6     Pain Location  Back    Pain Orientation  Mid;Lower    Pain Descriptors / Indicators  Aching;Tightness;Sore    Pain Type  Chronic pain    Pain Onset  More than a month ago    Pain Frequency  Intermittent         [facility] PT Assessment - 07/19/18 0001      Assessment   Medical Diagnosis  LBP and spasm, mild degenerative changes facet arthropathy L4-5, and lower abdominal pain    Referring Provider (PT)  Kerin Perna, NP      Observation/Other Assessments   Focus on Therapeutic Outcomes (FOTO)   47% limited                   [facility] Adult PT Treatment/Exercise - 07/19/18 0806      Lumbar Exercises: Prone   Other Prone Lumbar Exercises  hamstring curl 2 x 10      Moist Heat Therapy   Number Minutes  Moist Heat  15 Minutes    Moist Heat Location  Lumbar Spine   prone     Electrical Stimulation   Electrical Stimulation Location  lumbar    Electrical Stimulation Action  IFC    Electrical Stimulation Parameters  in prone, x 15 min, L 15, 100% scann, 5000 carrier frequency    Electrical Stimulation Goals  Pain;Tone      Manual Therapy   Manual Therapy  Soft tissue mobilization;Joint mobilization    Manual therapy comments  skilled palpation and monitoring of pt throughout TPDN    Joint Mobilization  PA L1-L5 central mobs, R T7-T9 L unilateral PA mobs grade II for pain relief.  posterior innominate grade IIi (performed during hamstring curl)    Soft tissue mobilization  IASTM along the L lumbar paraspinals    Muscle Energy Technique  scissor technique with L hip extension/ knee flexion 10 x hold 10        Trigger Point Dry Needling - 07/19/18 0001    Consent Given?  Yes  Education Handout Provided  Yes    Muscles Treated Upper Quadrant  Longissimus   focuson gon the L    Longissimus Response  Twitch response elicited;Palpable increased muscle length           PT Education - 07/19/18 0805    Education Details  muscle anatomy and referral patterns. What TPDN is and benefits of care    Person(s) Educated  Patient    Methods  Explanation;Verbal cues;Handout    Comprehension  Verbalized understanding;Verbal cues required          PT Long Term Goals - 07/02/18 1501      PT LONG TERM GOAL #1   Title  Pt will be I and compliant with HEP. (6 weeks 06/05/18)    Baseline  relays not helping her back so revised today    Status  On-going      PT LONG TERM GOAL #2   Title  Pt will improve lumbar ROM to WNL 80-100%. (6 weeks 06/05/18)    Baseline  50% ROM currently    Status  On-going      PT LONG TERM GOAL #3   Title  Pt will be able to perform modified plank from knees for 30 sec to show improved core strength. (6 weeks 06/05/18)    Status  On-going      PT LONG TERM GOAL  #4   Title  Pt will improve FOTO to less than 45% limited. (6 weeks 06/05/18)    Status  On-going      PT LONG TERM GOAL #5   Title  Pt will report less than 3/10 overall back pain with usual activity including taking care of her son, housework, and cooking, and going up/down her 2 steps at home. (6 weeks 06/05/18)    Baseline  overall 5-6/10 pain    Status  On-going            Plan - 07/19/18 9147    Clinical Impression Statement  pt noted improvement after last session but reports increased symptoms today spasm. in prone L paraspinals are visably spasmed. educated about DN and consent was provided for the L lumbar paraspinals followed with IASTM and pain relief mobs. utilized e-stim combined with MHP end of session to calm down soreness/ spasm.    PT Next Visit Plan  prone ext PRN, hip flexor stretching, low back stretching, STW, discuss DN, thoracic mobs? response to DN    Consulted and Agree with Plan of Care  Patient       Patient will benefit from skilled therapeutic intervention in order to improve the following deficits and impairments:     Visit Diagnosis: Chronic bilateral low back pain without sciatica  Muscle spasm of back     Problem List Patient Active Problem List   Diagnosis Date Noted  . 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 OBESITY, NOS 04/19/2006  . BLEEDING, RECTAL 04/19/2006   Starr Lake PT, DPT, LAT, ATC  07/19/18  8:43 AM      [facility] [address] [place], [place], [address] Phone: [phone]   Fax:  [phone]  Name: Michele Trevino MRN: [id number] Date of Birth: 1975-03-08

## 2018-07-23 ENCOUNTER — Ambulatory Visit: Payer: Self-pay | Admitting: Physical Therapy

## 2018-07-24 ENCOUNTER — Other Ambulatory Visit: Payer: Self-pay

## 2018-07-24 ENCOUNTER — Telehealth: Payer: Self-pay | Admitting: Physical Therapy

## 2018-07-24 ENCOUNTER — Ambulatory Visit: Payer: Self-pay | Attending: Primary Care | Admitting: Physical Therapy

## 2018-07-24 ENCOUNTER — Encounter: Payer: Self-pay | Admitting: Physical Therapy

## 2018-07-24 DIAGNOSIS — M545 Low back pain, unspecified: Secondary | ICD-10-CM

## 2018-07-24 DIAGNOSIS — M62830 Muscle spasm of back: Secondary | ICD-10-CM | POA: Insufficient documentation

## 2018-07-24 DIAGNOSIS — G8929 Other chronic pain: Secondary | ICD-10-CM | POA: Insufficient documentation

## 2018-07-24 NOTE — Therapy (Signed)
[place], [place], [address] Phone: [phone]   Fax:  [phone]  Physical Therapy Treatment  Patient Details  Name: Michele Trevino MRN: [id number] Date of Birth: 1975/05/08 Referring Provider (PT): Kerin Perna, NP   Encounter Date: 07/24/2018  PT End of Session - 07/24/18 1428    Visit Number  9    Number of Visits  12    Date for PT Re-Evaluation  08/13/18    Authorization Type  [employer] 75%?    PT Start Time  1428    PT Stop Time  1518    PT Time Calculation (min)  50 min    Activity Tolerance  Patient tolerated treatment well    Behavior During Therapy  WFL for tasks assessed/performed       Past Medical History:  Diagnosis Date  . Arthritis   . Carpal tunnel syndrome     Past Surgical History:  Procedure Laterality Date  . CESAREAN SECTION      There were no vitals filed for this visit.  Subjective Assessment - 07/24/18 1428    Subjective  "the DN really helped my L low back. I am doing better but I did have to take pain medication"    Currently in Pain?  Yes    Pain Score  0-No pain   pt took pain meds at 12pm today   Pain Orientation  Lower;Mid    Pain Descriptors / Indicators  Tightness    Aggravating Factors   unsure of cause    Pain Relieving Factors  meds         [facility] PT Assessment - 07/24/18 0001      Assessment   Medical Diagnosis  LBP and spasm, mild degenerative changes facet arthropathy L4-5, and lower abdominal pain    Referring Provider (PT)  Kerin Perna, NP                   [facility] Adult PT Treatment/Exercise - 07/24/18 0001      Lumbar Exercises: Stretches   Hip Flexor Stretch  3 reps;Right;30 seconds    Quadruped Mid Back Stretch  2 reps;30 seconds      Lumbar Exercises: Aerobic   Nustep  5 min L5 UE/LE      Lumbar Exercises: Sidelying   Hip Abduction  Both;15 reps;2 seconds      Lumbar Exercises: Quadruped   [place]/Old Horse  10 reps      Moist Heat Therapy   Number Minutes Moist Heat  10 Minutes    Moist Heat Location  Lumbar Spine      Electrical Stimulation   Electrical Stimulation Location  lumbar    Electrical Stimulation Action  IFC    Electrical Stimulation Parameters  in pronex 10 min, 100% scan, L13    Electrical Stimulation Goals  Pain;Tone      Manual Therapy   Manual Therapy  Myofascial release    Manual therapy comments  skilled palpation and monitoring of pt throughout TPDN    Joint Mobilization  PA L1-L5 central mobs, R T7-T9 L unilateral PA mobs grade II for pain relief.  posterior innominate grade IIi (performed during hamstring curl)    Soft tissue mobilization  IASTM along the L lumbar paraspinals    Myofascial Release  fascial rolling/ stretching along thoracolumbar spine       Trigger Point Dry Needling - 07/24/18 0001    Consent Given?  Yes  Education Handout Provided  Previously provided    Longissimus Response  Twitch response elicited;Palpable increased muscle length   at bil thoracolumbar multifidi               PT Long Term Goals - 07/02/18 1501      PT LONG TERM GOAL #1   Title  Pt will be I and compliant with HEP. (6 weeks 06/05/18)    Baseline  relays not helping her back so revised today    Status  On-going      PT LONG TERM GOAL #2   Title  Pt will improve lumbar ROM to WNL 80-100%. (6 weeks 06/05/18)    Baseline  50% ROM currently    Status  On-going      PT LONG TERM GOAL #3   Title  Pt will be able to perform modified plank from knees for 30 sec to show improved core strength. (6 weeks 06/05/18)    Status  On-going      PT LONG TERM GOAL #4   Title  Pt will improve FOTO to less than 45% limited. (6 weeks 06/05/18)    Status  On-going      PT LONG TERM GOAL #5   Title  Pt will report less than 3/10 overall back pain with usual activity including taking care of her son, housework, and cooking, and going up/down her 2 steps at home.  (6 weeks 06/05/18)    Baseline  overall 5-6/10 pain    Status  On-going            Plan - 07/24/18 1445    Clinical Impression Statement  pt noted significant improvement with DN last session. Cotninued TPDN focus on bil thoracolumbar multifidi, continued STW along bil thoracolumabr paraspinals. continued hip flexor stretching followed with posterior LE chain strengtheing. continued E-stim with MHP end of session to calm down pain and soreness.    PT Next Visit Plan  prone ext PRN, hip flexor stretching, low back stretching, STW, discuss DN, thoracic mobs? TPDN    PT Home Exercise Plan  now only gave her prone lying over one pillow, then POE, then press ups, MTPR using tennis ball    Consulted and Agree with Plan of Care  Patient       Patient will benefit from skilled therapeutic intervention in order to improve the following deficits and impairments:  Decreased activity tolerance, Decreased endurance, Decreased range of motion, Decreased strength, Increased muscle spasms, Increased fascial restricitons, Obesity, Postural dysfunction, Pain  Visit Diagnosis: Chronic bilateral low back pain without sciatica  Muscle spasm of back     Problem List Patient Active Problem List   Diagnosis Date Noted  . 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 OBESITY, NOS 04/19/2006  . BLEEDING, RECTAL 04/19/2006    Starr Lake PT, DPT, LAT, ATC  07/24/18  3:09 PM      [facility] [address] [place], [place], [address] Phone: [phone]   Fax:  [phone]  Name: Michele Trevino MRN: [id number] Date of Birth: [birth year]

## 2018-07-24 NOTE — Telephone Encounter (Signed)
Opened in error

## 2018-07-31 ENCOUNTER — Ambulatory Visit: Payer: Self-pay | Admitting: Physical Therapy

## 2018-07-31 ENCOUNTER — Other Ambulatory Visit: Payer: Self-pay

## 2018-07-31 ENCOUNTER — Encounter: Payer: Self-pay | Admitting: Physical Therapy

## 2018-07-31 DIAGNOSIS — G8929 Other chronic pain: Secondary | ICD-10-CM

## 2018-07-31 DIAGNOSIS — M62830 Muscle spasm of back: Secondary | ICD-10-CM

## 2018-07-31 NOTE — Therapy (Signed)
[place], [place], [address] Phone: [phone]   Fax:  [phone]  Physical Therapy Treatment  Patient Details  Name: Michele Trevino MRN: [id number] Date of Birth: 1975-12-12 Referring Provider (PT): Kerin Perna, NP   Encounter Date: 07/31/2018  PT End of Session - 07/31/18 0850    Visit Number  10    Number of Visits  12    Date for PT Re-Evaluation  08/13/18    PT Start Time  0848    PT Stop Time  0942    PT Time Calculation (min)  54 min    Activity Tolerance  Patient tolerated treatment well       Past Medical History:  Diagnosis Date  . Arthritis   . Carpal tunnel syndrome     Past Surgical History:  Procedure Laterality Date  . CESAREAN SECTION      There were no vitals filed for this visit.  Subjective Assessment - 07/31/18 0851    Subjective  "I am doing alittle better, the DN helped. 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  Yes    Pain Score  0-No pain    Pain Location  Back    Pain Orientation  Lower    Pain Type  Chronic pain    Pain Onset  More than a month ago    Pain Frequency  Intermittent    Aggravating Factors   unable to report         [facility] PT Assessment - 07/31/18 0001      Assessment   Medical Diagnosis  LBP and spasm, mild degenerative changes facet arthropathy L4-5, and lower abdominal pain    Referring Provider (PT)  Kerin Perna, NP                   [facility] Adult PT Treatment/Exercise - 07/31/18 0001      Self-Care   Other Self-Care Comments   reviewed MTPR techniques and how to use theracane along with tennis balls       Exercises   Exercises  Knee/Hip      Lumbar Exercises: Stretches   Hip Flexor Stretch  4 reps;30 seconds    Other Lumbar Stretch Exercise  seated low back stretching 2 x 30 sec    cues to avoid  painfull motion and to back off stretch slight     Lumbar Exercises: Standing   Other Standing Lumbar Exercises  pressing down on physioball with bil UE for core activation 1 x 15      Knee/Hip Exercises: Seated   Hamstring Curl  2 sets;15 reps    Hamstring Limitations  green theraband      Moist Heat Therapy   Number Minutes Moist Heat  15 Minutes    Moist Heat Location  Lumbar Spine   prone with pillows under hips     Electrical Stimulation   Electrical Stimulation Location  lumbar    Electrical Stimulation Action  IFC    Electrical Stimulation Parameters  in prone, 15 min, L 13 @ 100% scan    Electrical Stimulation Goals  Pain;Tone      Manual Therapy   Manual Therapy  Other (comment)    Manual therapy comments  MTpR along the L paraspinals    Other Manual Therapy  attempted  trial of SI belt which pt noted no improvement             PT Education - 07/31/18 0857    Education Details  how to perform MTPR using theracane, modified low back stretch in sitting    Person(s) Educated  Patient    Methods  Explanation;Verbal cues;Handout    Comprehension  Verbalized understanding;Verbal cues required          PT Long Term Goals - 07/02/18 1501      PT LONG TERM GOAL #1   Title  Pt will be I and compliant with HEP. (6 weeks 06/05/18)    Baseline  relays not helping her back so revised today    Status  On-going      PT LONG TERM GOAL #2   Title  Pt will improve lumbar ROM to WNL 80-100%. (6 weeks 06/05/18)    Baseline  50% ROM currently    Status  On-going      PT LONG TERM GOAL #3   Title  Pt will be able to perform modified plank from knees for 30 sec to show improved core strength. (6 weeks 06/05/18)    Status  On-going      PT LONG TERM GOAL #4   Title  Pt will improve FOTO to less than 45% limited. (6 weeks 06/05/18)    Status  On-going      PT LONG TERM GOAL #5   Title  Pt will report less than 3/10 overall back pain with usual activity including taking care  of her son, housework, and cooking, and going up/down her 2 steps at home. (6 weeks 06/05/18)    Baseline  overall 5-6/10 pain    Status  On-going            Plan - 07/31/18 0923    Clinical Impression Statement  pt continues to report mostly stiffness with N/T that goes down bil LE, with no specific cause and worses with flexion/ extension biased treatment. Continued working on hamstring strengtheing and hip flexor stretching to reduce amount of lumbar lordosis. continued MHP and E-stim at end of session. Discussed with pt that if no functional progress is made next visit then plan to discuss moving forward vs referring back to MD.     PT Treatment/Interventions  Aquatic Therapy;Cryotherapy;Electrical Stimulation;Iontophoresis 4mg /ml Dexamethasone;Moist Heat;Traction;Ultrasound;Gait training;Stair training;Therapeutic activities;Therapeutic exercise;Balance training;Neuromuscular re-education;Passive range of motion;Dry needling;Taping;Spinal Manipulations;Joint Manipulations    PT Next Visit Plan  prone ext PRN, hip flexor stretching, low back stretching, STW, discuss DN, thoracic mobs? TPDN    PT Home Exercise Plan  now only gave her prone lying over one pillow, then POE, then press ups, MTPR using tennis ball    Consulted and Agree with Plan of Care  Patient       Patient will benefit from skilled therapeutic intervention in order to improve the following deficits and impairments:  Decreased activity tolerance, Decreased endurance, Decreased range of motion, Decreased strength, Increased muscle spasms, Increased fascial restricitons, Obesity, Postural dysfunction, Pain  Visit Diagnosis: Chronic bilateral low back pain without sciatica  Muscle spasm of back     Problem List Patient Active Problem List   Diagnosis Date Noted  . 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 OBESITY, NOS 04/19/2006  . BLEEDING, RECTAL 04/19/2006   Starr Lake PT, DPT, LAT, ATC  07/31/18  9:30  AM      [facility] [address] [place], [place], [address] Phone:  [phone]   Fax:  [phone]  Name: Michele Trevino MRN: [id number] Date of Birth: 10-Mar-1975

## 2018-08-02 ENCOUNTER — Ambulatory Visit: Payer: Self-pay | Admitting: Physical Therapy

## 2018-08-05 ENCOUNTER — Encounter (HOSPITAL_COMMUNITY): Payer: Self-pay | Admitting: Emergency Medicine

## 2018-08-05 ENCOUNTER — Emergency Department (HOSPITAL_COMMUNITY): Payer: Self-pay

## 2018-08-05 ENCOUNTER — Emergency Department (HOSPITAL_COMMUNITY)
Admission: EM | Admit: 2018-08-05 | Discharge: 2018-08-05 | Disposition: A | Discharge status: Home / Self Care | Payer: Self-pay | Attending: Emergency Medicine | Admitting: Emergency Medicine

## 2018-08-05 ENCOUNTER — Ambulatory Visit (HOSPITAL_COMMUNITY)
Admission: EM | Admit: 2018-08-05 | Discharge: 2018-08-05 | Disposition: A | Discharge status: Other Acute Inpatient Hospital | Payer: No Typology Code available for payment source | Attending: Family Medicine | Admitting: Family Medicine

## 2018-08-05 ENCOUNTER — Encounter (HOSPITAL_COMMUNITY): Payer: Self-pay

## 2018-08-05 ENCOUNTER — Ambulatory Visit: Payer: No Typology Code available for payment source

## 2018-08-05 ENCOUNTER — Other Ambulatory Visit: Payer: Self-pay

## 2018-08-05 DIAGNOSIS — N760 Acute vaginitis: Secondary | ICD-10-CM | POA: Insufficient documentation

## 2018-08-05 DIAGNOSIS — Z87891 Personal history of nicotine dependence: Secondary | ICD-10-CM | POA: Insufficient documentation

## 2018-08-05 DIAGNOSIS — R109 Unspecified abdominal pain: Secondary | ICD-10-CM

## 2018-08-05 DIAGNOSIS — N730 Acute parametritis and pelvic cellulitis: Secondary | ICD-10-CM | POA: Insufficient documentation

## 2018-08-05 DIAGNOSIS — B9689 Other specified bacterial agents as the cause of diseases classified elsewhere: Secondary | ICD-10-CM | POA: Insufficient documentation

## 2018-08-05 DIAGNOSIS — Z79899 Other long term (current) drug therapy: Secondary | ICD-10-CM | POA: Insufficient documentation

## 2018-08-05 DIAGNOSIS — R1030 Lower abdominal pain, unspecified: Secondary | ICD-10-CM

## 2018-08-05 LAB — POCT URINALYSIS DIP (DEVICE)
Bilirubin Urine: NEGATIVE
Glucose, UA: NEGATIVE mg/dL
Hgb urine dipstick: NEGATIVE
Ketones, ur: NEGATIVE mg/dL
Leukocytes,Ua: NEGATIVE
Nitrite: NEGATIVE
Protein, ur: NEGATIVE mg/dL
Specific Gravity, Urine: 1.015 (ref 1.005–1.030)
Urobilinogen, UA: 0.2 mg/dL (ref 0.0–1.0)
pH: 6.5 (ref 5.0–8.0)

## 2018-08-05 LAB — CBC WITH DIFFERENTIAL/PLATELET
Abs Immature Granulocytes: 0 10*3/uL (ref 0.00–0.07)
Basophils Absolute: 0 10*3/uL (ref 0.0–0.1)
Basophils Relative: 0 %
Eosinophils Absolute: 0.3 10*3/uL (ref 0.0–0.5)
Eosinophils Relative: 3 %
HCT: 32.7 % — ABNORMAL LOW (ref 36.0–46.0)
Hemoglobin: 9.8 g/dL — ABNORMAL LOW (ref 12.0–15.0)
Lymphocytes Relative: 39 %
Lymphs Abs: 4.2 10*3/uL — ABNORMAL HIGH (ref 0.7–4.0)
MCH: 24.2 pg — ABNORMAL LOW (ref 26.0–34.0)
MCHC: 30 g/dL (ref 30.0–36.0)
MCV: 80.7 fL (ref 80.0–100.0)
Monocytes Absolute: 0.5 10*3/uL (ref 0.1–1.0)
Monocytes Relative: 5 %
Neutro Abs: 5.7 10*3/uL (ref 1.7–7.7)
Neutrophils Relative %: 53 %
Platelets: 550 10*3/uL — ABNORMAL HIGH (ref 150–400)
RBC: 4.05 MIL/uL (ref 3.87–5.11)
RDW: 16.3 % — ABNORMAL HIGH (ref 11.5–15.5)
WBC: 10.8 10*3/uL — ABNORMAL HIGH (ref 4.0–10.5)
nRBC: 0 % (ref 0.0–0.2)
nRBC: 0 /100 WBC

## 2018-08-05 LAB — COMPREHENSIVE METABOLIC PANEL
ALT: 20 U/L (ref 0–44)
AST: 29 U/L (ref 15–41)
Albumin: 3.9 g/dL (ref 3.5–5.0)
Alkaline Phosphatase: 50 U/L (ref 38–126)
Anion gap: 8 (ref 5–15)
BUN: <5 mg/dL — ABNORMAL LOW (ref 6–20)
CO2: 26 mmol/L (ref 22–32)
Calcium: 9.4 mg/dL (ref 8.9–10.3)
Chloride: 104 mmol/L (ref 98–111)
Creatinine, Ser: 0.87 mg/dL (ref 0.44–1.00)
GFR calc Af Amer: >60 mL/min (ref >60)
GFR calc non Af Amer: >60 mL/min (ref >60)
Glucose, Bld: 97 mg/dL (ref 70–99)
Potassium: 4.4 mmol/L (ref 3.5–5.1)
Sodium: 138 mmol/L (ref 135–145)
Total Bilirubin: 0.6 mg/dL (ref 0.3–1.2)
Total Protein: 7.2 g/dL (ref 6.5–8.1)

## 2018-08-05 LAB — URINALYSIS, ROUTINE W REFLEX MICROSCOPIC
Bilirubin Urine: NEGATIVE
Glucose, UA: NEGATIVE mg/dL
Hgb urine dipstick: NEGATIVE
Ketones, ur: NEGATIVE mg/dL
Leukocytes,Ua: NEGATIVE
Nitrite: NEGATIVE
Protein, ur: NEGATIVE mg/dL
Specific Gravity, Urine: 1.004 — ABNORMAL LOW (ref 1.005–1.030)
pH: 6 (ref 5.0–8.0)

## 2018-08-05 LAB — I-STAT BETA HCG BLOOD, ED (MC, WL, AP ONLY): I-stat hCG, quantitative: <5 m[IU]/mL (ref <5)

## 2018-08-05 LAB — WET PREP, GENITAL
Sperm: NONE SEEN
Trich, Wet Prep: NONE SEEN
Yeast Wet Prep HPF POC: NONE SEEN

## 2018-08-05 LAB — LIPASE, BLOOD: Lipase: 22 U/L (ref 11–51)

## 2018-08-05 MED ORDER — IOHEXOL 300 MG/ML  SOLN
100.0000 mL | Freq: Once | INTRAMUSCULAR | Status: AC | PRN
  Administered 2018-08-05: 100 mL via INTRAVENOUS

## 2018-08-05 MED ORDER — LIDOCAINE HCL (PF) 1 % IJ SOLN: 2.0000 mL | Freq: Once | INTRAMUSCULAR | Status: DC

## 2018-08-05 MED ORDER — AZITHROMYCIN 250 MG PO TABS
1000.0000 mg | ORAL_TABLET | Freq: Once | ORAL | Status: AC
  Administered 2018-08-05: 1000 mg via ORAL
  Filled 2018-08-05: qty 4

## 2018-08-05 MED ORDER — HYDROMORPHONE HCL 1 MG/ML IJ SOLN
1.0000 mg | Freq: Once | INTRAMUSCULAR | Status: AC
  Administered 2018-08-05: 1 mg via INTRAVENOUS
  Filled 2018-08-05: qty 1

## 2018-08-05 MED ORDER — HYDROMORPHONE HCL 1 MG/ML IJ SOLN
0.5000 mg | Freq: Once | INTRAMUSCULAR | Status: AC
  Administered 2018-08-05: 0.5 mg via INTRAVENOUS
  Filled 2018-08-05: qty 1

## 2018-08-05 MED ORDER — KETOROLAC TROMETHAMINE 30 MG/ML IJ SOLN
30.0000 mg | Freq: Once | INTRAMUSCULAR | Status: AC
  Administered 2018-08-05: 23:00:00 30 mg via INTRAVENOUS
  Filled 2018-08-05: qty 1

## 2018-08-05 MED ORDER — CEFTRIAXONE SODIUM 250 MG IJ SOLR
250.0000 mg | Freq: Once | INTRAMUSCULAR | Status: AC
  Administered 2018-08-05: 250 mg via INTRAMUSCULAR
  Filled 2018-08-05: qty 250

## 2018-08-05 MED ORDER — DOXYCYCLINE HYCLATE 100 MG PO CAPS: 100.0000 mg | ORAL_CAPSULE | Freq: Two times a day (BID) | ORAL | 0 refills | Status: AC

## 2018-08-05 MED ORDER — DEXTROSE 5 % IV SOLN: 250.0000 mg | Freq: Once | INTRAVENOUS | Status: DC

## 2018-08-05 MED ORDER — ONDANSETRON HCL 4 MG/2ML IJ SOLN
4.0000 mg | Freq: Once | INTRAMUSCULAR | Status: AC
  Administered 2018-08-05: 4 mg via INTRAVENOUS
  Filled 2018-08-05: qty 2

## 2018-08-05 MED ORDER — LIDOCAINE HCL (PF) 1 % IJ SOLN
INTRAMUSCULAR | Status: AC
  Administered 2018-08-05: 5 mL
  Filled 2018-08-05: qty 5

## 2018-08-05 MED ORDER — METRONIDAZOLE 500 MG PO TABS: 500.0000 mg | ORAL_TABLET | Freq: Two times a day (BID) | ORAL | 0 refills | Status: DC

## 2018-08-05 MED ORDER — DICYCLOMINE HCL 20 MG PO TABS: 20.0000 mg | ORAL_TABLET | Freq: Two times a day (BID) | ORAL | 0 refills | Status: DC | PRN

## 2018-08-05 NOTE — ED Provider Notes (Signed)
[facility] EMERGENCY DEPARTMENT Provider Note   CSN: [id number] Arrival date & time: 08/05/18  1755    History   Chief Complaint Chief Complaint  Patient presents with   RLQ pain    HPI Michele Trevino is a 43 y.o. female with history of arthritis, carpal tunnel syndrome presenting for evaluation of acute onset, progressively worsening right flank pain and right lower quadrant abdominal pain since yesterday evening.  She reports severe sharp cramping pains which worsen with movements.  She notes nausea but no vomiting.  She states that she thought that she was experiencing gas or constipation so she took castor oil and Gas-X without relief in her symptoms.  She has had watery nonbloody bowel movements since her symptoms began.  Notes some urinary frequency but denies urgency, hematuria, or dysuria.  Denies any vaginal bleeding or discharge but notes some vaginal itching which is unusual for him.  No recent travel, no known sick contacts, no suspicious food intake, no recent treatment with antibiotics.  She reports this pain is very different from her usual back pains.     The history is provided by the patient.    Past Medical History:  Diagnosis Date   Arthritis    Carpal tunnel syndrome     Patient Active Problem List   Diagnosis Date Noted   UTERINE FIBROID 04/19/2006   OBESITY, NOS 04/19/2006   BLEEDING, RECTAL 04/19/2006    Past Surgical History:  Procedure Laterality Date   CESAREAN SECTION       OB History    Gravida  4   Para      Term      Preterm      AB      Living  4     SAB      TAB      Ectopic      Multiple      Live Births  4            Home Medications    Prior to Admission medications   Medication Sig Start Date End Date Taking? Authorizing Provider  albuterol (VENTOLIN HFA) 108 (90 Base) MCG/ACT inhaler Inhale 1-2 puffs into the lungs every 6 (six) hours as needed for wheezing or shortness  of breath. 06/25/18   Kerin Perna, NP  cyclobenzaprine (FLEXERIL) 10 MG tablet Take 1 tablet (10 mg total) by mouth 2 (two) times daily as needed for muscle spasms. 06/21/18   Raylene Everts, MD  dicyclomine (BENTYL) 20 MG tablet Take 1 tablet (20 mg total) by mouth 2 (two) times daily as needed for spasms. 08/05/18   Mccade Sullenberger A, PA-C  doxycycline (VIBRAMYCIN) 100 MG capsule Take 1 capsule (100 mg total) by mouth 2 (two) times daily for 14 days. 08/05/18 08/19/18  Rodell Perna A, PA-C  fluticasone (FLONASE) 50 MCG/ACT nasal spray Place 2 sprays into both nostrils daily. 06/25/18   Kerin Perna, NP  HYDROcodone-acetaminophen (NORCO) 7.5-325 MG tablet Take 1 tablet by mouth every 6 (six) hours as needed for moderate pain. 06/21/18   Raylene Everts, MD  loratadine (CLARITIN) 10 MG tablet Take 1 tablet (10 mg total) by mouth daily. 06/25/18   Kerin Perna, NP  metroNIDAZOLE (FLAGYL) 500 MG tablet Take 1 tablet (500 mg total) by mouth 2 (two) times daily. 08/05/18   Renita Papa, PA-C    Family History Family History  Problem Relation Age of Onset   Throat cancer  Mother    Diabetes Father    Hypertension Father     Social History Social History   Tobacco Use   Smoking status: Former Smoker   Smokeless tobacco: Never Used  Substance Use Topics   Alcohol use: Yes    Frequency: Never    Comment: socially    Drug use: No     Allergies   Latex   Review of Systems Review of Systems  Constitutional: Negative for chills and fever.  Respiratory: Negative for shortness of breath.   Cardiovascular: Negative for chest pain.  Gastrointestinal: Positive for abdominal pain, diarrhea and nausea. Negative for blood in stool, constipation and vomiting.  Genitourinary: Positive for flank pain and frequency. Negative for hematuria, urgency, vaginal bleeding, vaginal discharge and vaginal pain.  All other systems reviewed and are negative.    Physical Exam Updated  Vital Signs BP 135/69    Pulse 73    Temp 98.5 F (36.9 C) (Oral)    Resp 18    Wt 94.1 kg    SpO2 96%    BMI 39.20 kg/m   Physical Exam Vitals signs and nursing note reviewed.  Constitutional:      General: She is not in acute distress.    Appearance: She is well-developed.     Comments: Appears uncomfortable  HENT:     Head: Normocephalic and atraumatic.  Eyes:     General:        Right eye: No discharge.        Left eye: No discharge.     Conjunctiva/sclera: Conjunctivae normal.  Neck:     Vascular: No JVD.     Trachea: No tracheal deviation.  Cardiovascular:     Rate and Rhythm: Normal rate and regular rhythm.  Pulmonary:     Effort: Pulmonary effort is normal.     Breath sounds: Normal breath sounds.  Abdominal:     General: Abdomen is protuberant. Bowel sounds are decreased. There is no distension.     Palpations: Abdomen is soft.     Tenderness: There is abdominal tenderness in the right upper quadrant, right lower quadrant, periumbilical area, suprapubic area and left lower quadrant. There is no guarding or rebound. Positive signs include Murphy's sign and psoas sign. Negative signs include Rovsing's sign and McBurney's sign.     Comments: Voluntary guarding noted.  Genitourinary:    Comments: Examination performed in the presence of a chaperone.  No masses or lesions to the external genitalia.  Patient has copious amount of thick white discharge in the vaginal vault.  She has cervical motion tenderness and bilateral adnexal tenderness Musculoskeletal:     Comments: No midline lumbar spine tenderness.  Diffuse right-sided paralumbar muscle tenderness with no deformity, crepitus, or step-off noted.  Skin:    General: Skin is warm and dry.     Findings: No erythema.  Neurological:     Mental Status: She is alert.  Psychiatric:        Behavior: Behavior normal.      ED Treatments / Results  Labs (all labs ordered are listed, but only abnormal results are  displayed) Labs Reviewed  WET PREP, GENITAL - Abnormal; Notable for the following components:      Result Value   Clue Cells Wet Prep HPF POC PRESENT (*)    WBC, Wet Prep HPF POC FEW (*)    All other components within normal limits  COMPREHENSIVE METABOLIC PANEL - Abnormal; Notable for the following components:  BUN <5 (*)    All other components within normal limits  CBC WITH DIFFERENTIAL/PLATELET - Abnormal; Notable for the following components:   WBC 10.8 (*)    Hemoglobin 9.8 (*)    HCT 32.7 (*)    MCH 24.2 (*)    RDW 16.3 (*)    Platelets 550 (*)    Lymphs Abs 4.2 (*)    All other components within normal limits  URINALYSIS, ROUTINE W REFLEX MICROSCOPIC - Abnormal; Notable for the following components:   Color, Urine STRAW (*)    Specific Gravity, Urine 1.004 (*)    All other components within normal limits  LIPASE, BLOOD  RPR  HIV ANTIBODY (ROUTINE TESTING W REFLEX)  I-STAT BETA HCG BLOOD, ED ([facility], [facility], [facility] ONLY)  GC/CHLAMYDIA PROBE AMP ([place]) NOT AT [facility]    EKG None  Radiology US Transvaginal Non-ob  Result Date: 08/05/2018 CLINICAL DATA:  Right lower quadrant abdominal pain EXAM: TRANSABDOMINAL AND TRANSVAGINAL ULTRASOUND OF PELVIS DOPPLER ULTRASOUND OF OVARIES TECHNIQUE: Both transabdominal and transvaginal ultrasound examinations of the pelvis were performed. Transabdominal technique was performed for global imaging of the pelvis including uterus, ovaries, adnexal regions, and pelvic cul-de-sac. It was necessary to proceed with endovaginal exam following the transabdominal exam to visualize the ovaries. Color and duplex Doppler ultrasound was utilized to evaluate blood flow to the ovaries. COMPARISON:  None. FINDINGS: Uterus Measurements: 7.1 x 4 x 3.5 cm = volume: 52 mL. The uterus was suboptimally evaluated secondary to patient condition. Endometrium Thickness: 4 mm.  No focal abnormality visualized. Right ovary Measurements: 3.3 x 2.6 x 2.2 cm = volume: 9.7 mL.  Normal appearance/no adnexal mass. Left ovary Not visualized Pulsed Doppler evaluation of the right ovary demonstrates normal low-resistance arterial and venous waveforms. The left ovary was not visualized. Other findings No free fluid was visualized on this exam, however there is a small amount of free fluid in the patient's pelvis. IMPRESSION: 1. Very limited study secondary to patient condition. 2. No definite acute sonographic abnormality detected given this limitation. 3. The left ovary was not visualized. Of note, there is no definite abnormality detected involving the left ovary on the patient's recent CT. 4. No sonographic evidence for ovarian torsion involving the right ovary. 5. No free fluid visualized on this exam, however there is some pelvic free fluid of the patient's recent CT which may be physiologic. Electronically Signed   By: Constance Holster M.D.   On: 08/05/2018 21:59   US Pelvis Complete  Result Date: 08/05/2018 CLINICAL DATA:  Right lower quadrant abdominal pain EXAM: TRANSABDOMINAL AND TRANSVAGINAL ULTRASOUND OF PELVIS DOPPLER ULTRASOUND OF OVARIES TECHNIQUE: Both transabdominal and transvaginal ultrasound examinations of the pelvis were performed. Transabdominal technique was performed for global imaging of the pelvis including uterus, ovaries, adnexal regions, and pelvic cul-de-sac. It was necessary to proceed with endovaginal exam following the transabdominal exam to visualize the ovaries. Color and duplex Doppler ultrasound was utilized to evaluate blood flow to the ovaries. COMPARISON:  None. FINDINGS: Uterus Measurements: 7.1 x 4 x 3.5 cm = volume: 52 mL. The uterus was suboptimally evaluated secondary to patient condition. Endometrium Thickness: 4 mm.  No focal abnormality visualized. Right ovary Measurements: 3.3 x 2.6 x 2.2 cm = volume: 9.7 mL. Normal appearance/no adnexal mass. Left ovary Not visualized Pulsed Doppler evaluation of the right ovary demonstrates normal  low-resistance arterial and venous waveforms. The left ovary was not visualized. Other findings No free fluid was visualized on this exam, however  there is a small amount of free fluid in the patient's pelvis. IMPRESSION: 1. Very limited study secondary to patient condition. 2. No definite acute sonographic abnormality detected given this limitation. 3. The left ovary was not visualized. Of note, there is no definite abnormality detected involving the left ovary on the patient's recent CT. 4. No sonographic evidence for ovarian torsion involving the right ovary. 5. No free fluid visualized on this exam, however there is some pelvic free fluid of the patient's recent CT which may be physiologic. Electronically Signed   By: Constance Holster M.D.   On: 08/05/2018 21:59   Ct Abdomen Pelvis W Contrast  Result Date: 08/05/2018 CLINICAL DATA:  Right flank pain. EXAM: CT ABDOMEN AND PELVIS WITH CONTRAST TECHNIQUE: Multidetector CT imaging of the abdomen and pelvis was performed using the standard protocol following bolus administration of intravenous contrast. CONTRAST:  180mL OMNIPAQUE IOHEXOL 300 MG/ML  SOLN COMPARISON:  None. FINDINGS: Lower chest: No acute abnormality. Hepatobiliary: No focal liver abnormality is seen. No gallstones, gallbladder wall thickening, or biliary dilatation. Pancreas: Unremarkable. No pancreatic ductal dilatation or surrounding inflammatory changes. Spleen: Normal in size without focal abnormality. Adrenals/Urinary Tract: Adrenal glands are unremarkable. Kidneys are normal, without renal calculi, focal lesion, or hydronephrosis. Bladder is unremarkable. Stomach/Bowel: Stomach is within normal limits. Appendix appears normal. No evidence of bowel wall thickening, distention, or inflammatory changes. Vascular/Lymphatic: No significant vascular findings are present. No enlarged abdominal or pelvic lymph nodes. Reproductive: Uterus and bilateral adnexa are unremarkable. Other: No abdominal  wall hernia or abnormality. No abdominopelvic ascites. Musculoskeletal: No acute or significant osseous findings. IMPRESSION: No evidence of obstructive uropathy or other acute abnormality. Electronically Signed   By: Fidela Salisbury M.D.   On: 08/05/2018 20:43   [place] Art/ven Flow Abd Pelv Doppler  Result Date: 08/05/2018 CLINICAL DATA:  Right lower quadrant abdominal pain EXAM: TRANSABDOMINAL AND TRANSVAGINAL ULTRASOUND OF PELVIS DOPPLER ULTRASOUND OF OVARIES TECHNIQUE: Both transabdominal and transvaginal ultrasound examinations of the pelvis were performed. Transabdominal technique was performed for global imaging of the pelvis including uterus, ovaries, adnexal regions, and pelvic cul-de-sac. It was necessary to proceed with endovaginal exam following the transabdominal exam to visualize the ovaries. Color and duplex Doppler ultrasound was utilized to evaluate blood flow to the ovaries. COMPARISON:  None. FINDINGS: Uterus Measurements: 7.1 x 4 x 3.5 cm = volume: 52 mL. The uterus was suboptimally evaluated secondary to patient condition. Endometrium Thickness: 4 mm.  No focal abnormality visualized. Right ovary Measurements: 3.3 x 2.6 x 2.2 cm = volume: 9.7 mL. Normal appearance/no adnexal mass. Left ovary Not visualized Pulsed Doppler evaluation of the right ovary demonstrates normal low-resistance arterial and venous waveforms. The left ovary was not visualized. Other findings No free fluid was visualized on this exam, however there is a small amount of free fluid in the patient's pelvis. IMPRESSION: 1. Very limited study secondary to patient condition. 2. No definite acute sonographic abnormality detected given this limitation. 3. The left ovary was not visualized. Of note, there is no definite abnormality detected involving the left ovary on the patient's recent CT. 4. No sonographic evidence for ovarian torsion involving the right ovary. 5. No free fluid visualized on this exam, however there is some  pelvic free fluid of the patient's recent CT which may be physiologic. Electronically Signed   By: Constance Holster M.D.   On: 08/05/2018 21:59    Procedures Procedures (including critical care time)  Medications Ordered in ED Medications  ketorolac (TORADOL)  30 MG/ML injection 30 mg (has no administration in time range)  azithromycin (ZITHROMAX) tablet 1,000 mg (has no administration in time range)  HYDROmorphone (DILAUDID) injection 1 mg (has no administration in time range)  cefTRIAXone (ROCEPHIN) injection 250 mg (has no administration in time range)  HYDROmorphone (DILAUDID) injection 0.5 mg (0.5 mg Intravenous Given 08/05/18 2003)  ondansetron (ZOFRAN) injection 4 mg (4 mg Intravenous Given 08/05/18 2003)  iohexol (OMNIPAQUE) 300 MG/ML solution 100 mL (100 mLs Intravenous Contrast Given 08/05/18 2015)     Initial Impression / Assessment and Plan / ED Course  I have reviewed the triage vital signs and the nursing notes.  Pertinent labs & imaging results that were available during my care of the patient were reviewed by me and considered in my medical decision making (see chart for details).        Patient presenting for evaluation of lower abdominal pain beginning yesterday.  She is afebrile, vital signs are stable.  She is uncomfortable but nontoxic in appearance.  No peritoneal signs on examination of the abdomen.  Lab work reviewed by me shows mild nonspecific leukocytosis, mild anemia, no metabolic derangements.  UA does not suggest UTI or nephrolithiasis.  CT of the abdomen pelvis shows no acute intra-abdominal pathology, consider pelvic exam and pelvic ultrasound were obtained.  Pelvic exam is concerning for PID, and ultrasound shows some free pelvic fluid which could be physiologic.  Wet prep shows clue cells and WBCs, will treat for BV with Flagyl.  No evidence of TOA, ovarian torsion, or ectopic pregnancy.  Pain managed in the ED. Serial abdominal examinations remain benign.   She is tolerating p.o. fluid in the ED without difficulty.  Will treat for PID.  Recommend follow-up with PCP or OB/GYN for reevaluation of symptoms.  Discussed strict return precautions. Patient verbalized understanding of and agreement with plan and is safe for discharge home at this time.   Final Clinical Impressions(s) / ED Diagnoses   Final diagnoses:  Abdominal pain, lower  PID (acute pelvic inflammatory disease)  BV (bacterial vaginosis)    ED Discharge Orders         Ordered    dicyclomine (BENTYL) 20 MG tablet  2 times daily PRN     08/05/18 2255    metroNIDAZOLE (FLAGYL) 500 MG tablet  2 times daily     08/05/18 2255    doxycycline (VIBRAMYCIN) 100 MG capsule  2 times daily     08/05/18 2255           Renita Papa, PA-C 08/05/18 2259    Virgel Manifold, MD 08/06/18 1459

## 2018-08-05 NOTE — Discharge Instructions (Signed)
1. Medications: Please take all of your antibiotics until finished!   You may develop abdominal discomfort or diarrhea from the antibiotic.  You may help offset this with probiotics which you can buy or get in yogurt. Do not eat  or take the probiotics until 2 hours after your antibiotic. Take bentyl as needed for abdominal pains.  2. Treatment: rest, drink plenty of fluids, advance diet slowly.  Eat a diet of bland foods that will not upset your stomach.  3. Follow Up: Please followup with your primary doctor in 3 days for discussion of your diagnoses and further evaluation after today's visit; if you do not have a primary care doctor use the resource guide provided to find one; Please return to the ER for persistent vomiting, high fevers or worsening symptoms

## 2018-08-05 NOTE — Discharge Instructions (Signed)
Go to the Emergency department for evaltuion °

## 2018-08-05 NOTE — ED Triage Notes (Signed)
Patient presents to Urgent Care with complaints of right sided flank pain since last night. Patient reports she has also been having frequent urination. Pt has hx of arthritis and muscle spasms in her back.

## 2018-08-05 NOTE — ED Notes (Signed)
Patient transported to CT 

## 2018-08-05 NOTE — ED Triage Notes (Signed)
Pt in w/R flank pain radiating to RLQ, pain began gradually last night. Also reporting frequent urination. Denies any n/v/d, states she has been dealing with constipation x 2 days

## 2018-08-06 ENCOUNTER — Telehealth: Payer: Self-pay

## 2018-08-06 LAB — HIV ANTIBODY (ROUTINE TESTING W REFLEX): HIV Screen 4th Generation wRfx: NONREACTIVE

## 2018-08-06 LAB — RPR: RPR Ser Ql: NONREACTIVE

## 2018-08-06 NOTE — Telephone Encounter (Signed)
Returned patient's phone call about missed [facility] appointment. Patient stated that she was unable to come to appointment because she was in the ED. Patient was told to follow-up with PCP for abdominal pain. I asked patient if she wanted to reschedule her [facility] appointment she said she was not feeling up to it now and will call back after she sees her doctor. Will check back in with patient in a few weeks if she does not call back to reschedule.

## 2018-08-07 LAB — GC/CHLAMYDIA PROBE AMP (~~LOC~~) NOT AT ARMC
Chlamydia: NEGATIVE
Neisseria Gonorrhea: NEGATIVE

## 2018-08-07 NOTE — ED Provider Notes (Signed)
Michele Trevino    CSN: [id number] Arrival date & time: 08/05/18  1648      History   Chief Complaint Chief Complaint  Patient presents with   Flank Pain    HPI Michele Trevino is a 43 y.o. female.   The history is provided by the patient. No language interpreter was used.  Flank Pain This is a new problem. The problem occurs constantly. The problem has not changed since onset.Nothing aggravates the symptoms. Nothing relieves the symptoms. She has tried nothing for the symptoms. The treatment provided no relief.  Pt complains of right sided abdominal pain    Past Medical History:  Diagnosis Date   Arthritis    Carpal tunnel syndrome     Patient Active Problem List   Diagnosis Date Noted   UTERINE FIBROID 04/19/2006   OBESITY, NOS 04/19/2006   BLEEDING, RECTAL 04/19/2006    Past Surgical History:  Procedure Laterality Date   CESAREAN SECTION      OB History    Gravida  4   Para      Term      Preterm      AB      Living  4     SAB      TAB      Ectopic      Multiple      Live Births  4            Home Medications    Prior to Admission medications   Medication Sig Start Date End Date Taking? Authorizing Provider  albuterol (VENTOLIN HFA) 108 (90 Base) MCG/ACT inhaler Inhale 1-2 puffs into the lungs every 6 (six) hours as needed for wheezing or shortness of breath. 06/25/18   Kerin Perna, NP  cyclobenzaprine (FLEXERIL) 10 MG tablet Take 1 tablet (10 mg total) by mouth 2 (two) times daily as needed for muscle spasms. 06/21/18   Raylene Everts, MD  dicyclomine (BENTYL) 20 MG tablet Take 1 tablet (20 mg total) by mouth 2 (two) times daily as needed for spasms. 08/05/18   Fawze, Mina A, PA-C  doxycycline (VIBRAMYCIN) 100 MG capsule Take 1 capsule (100 mg total) by mouth 2 (two) times daily for 14 days. 08/05/18 08/19/18  Rodell Perna A, PA-C  fluticasone (FLONASE) 50 MCG/ACT nasal spray Place 2 sprays into both  nostrils daily. 06/25/18   Kerin Perna, NP  HYDROcodone-acetaminophen (NORCO) 7.5-325 MG tablet Take 1 tablet by mouth every 6 (six) hours as needed for moderate pain. 06/21/18   Raylene Everts, MD  loratadine (CLARITIN) 10 MG tablet Take 1 tablet (10 mg total) by mouth daily. 06/25/18   Kerin Perna, NP  metroNIDAZOLE (FLAGYL) 500 MG tablet Take 1 tablet (500 mg total) by mouth 2 (two) times daily. 08/05/18   Renita Papa, PA-C    Family History Family History  Problem Relation Age of Onset   Throat cancer Mother    Diabetes Father    Hypertension Father     Social History Social History   Tobacco Use   Smoking status: Former Smoker   Smokeless tobacco: Never Used  Substance Use Topics   Alcohol use: Yes    Frequency: Never    Comment: socially    Drug use: No     Allergies   Latex   Review of Systems Review of Systems  Genitourinary: Positive for flank pain.  All other systems reviewed and are negative.    Physical  Exam Triage Vital Signs ED Triage Vitals  Enc Vitals Group     BP 08/05/18 1706 134/81     Pulse Rate 08/05/18 1706 79     Resp 08/05/18 1706 18     Temp 08/05/18 1706 98.3 F (36.8 C)     Temp Source 08/05/18 1706 Oral     SpO2 08/05/18 1706 100 %     Weight --      Height --      Head Circumference --      Peak Flow --      Pain Score 08/05/18 1704 10     Pain Loc --      Pain Edu? --      Excl. in [place]? --    No data found.  Updated Vital Signs BP 134/81 (BP Location: Left Arm)    Pulse 79    Temp 98.3 F (36.8 C) (Oral)    Resp 18    SpO2 100%   Visual Acuity Right Eye Distance:   Left Eye Distance:   Bilateral Distance:    Right Eye Near:   Left Eye Near:    Bilateral Near:     Physical Exam Vitals signs and nursing note reviewed.  Constitutional:      Appearance: She is well-developed.  HENT:     Head: Normocephalic.     Right Ear: Tympanic membrane normal.     Nose: Nose normal.     Mouth/Throat:       Mouth: Mucous membranes are moist.  Neck:     Musculoskeletal: Normal range of motion.  Cardiovascular:     Rate and Rhythm: Normal rate.     Pulses: Normal pulses.  Pulmonary:     Effort: Pulmonary effort is normal.  Abdominal:     General: Abdomen is flat. There is no distension.  Musculoskeletal: Normal range of motion.  Skin:    General: Skin is warm.  Neurological:     General: No focal deficit present.     Mental Status: She is alert and oriented to person, place, and time.  Psychiatric:        Mood and Affect: Mood normal.      UC Treatments / Results  Labs (all labs ordered are listed, but only abnormal results are displayed) Labs Reviewed  POCT URINALYSIS DIP (DEVICE)    EKG None  Radiology US Transvaginal Non-ob  Result Date: 08/05/2018 CLINICAL DATA:  Right lower quadrant abdominal pain EXAM: TRANSABDOMINAL AND TRANSVAGINAL ULTRASOUND OF PELVIS DOPPLER ULTRASOUND OF OVARIES TECHNIQUE: Both transabdominal and transvaginal ultrasound examinations of the pelvis were performed. Transabdominal technique was performed for global imaging of the pelvis including uterus, ovaries, adnexal regions, and pelvic cul-de-sac. It was necessary to proceed with endovaginal exam following the transabdominal exam to visualize the ovaries. Color and duplex Doppler ultrasound was utilized to evaluate blood flow to the ovaries. COMPARISON:  None. FINDINGS: Uterus Measurements: 7.1 x 4 x 3.5 cm = volume: 52 mL. The uterus was suboptimally evaluated secondary to patient condition. Endometrium Thickness: 4 mm.  No focal abnormality visualized. Right ovary Measurements: 3.3 x 2.6 x 2.2 cm = volume: 9.7 mL. Normal appearance/no adnexal mass. Left ovary Not visualized Pulsed Doppler evaluation of the right ovary demonstrates normal low-resistance arterial and venous waveforms. The left ovary was not visualized. Other findings No free fluid was visualized on this exam, however there is a small  amount of free fluid in the patient's pelvis. IMPRESSION: 1. Very limited study secondary  to patient condition. 2. No definite acute sonographic abnormality detected given this limitation. 3. The left ovary was not visualized. Of note, there is no definite abnormality detected involving the left ovary on the patient's recent CT. 4. No sonographic evidence for ovarian torsion involving the right ovary. 5. No free fluid visualized on this exam, however there is some pelvic free fluid of the patient's recent CT which may be physiologic. Electronically Signed   By: Constance Holster M.D.   On: 08/05/2018 21:59   US Pelvis Complete  Result Date: 08/05/2018 CLINICAL DATA:  Right lower quadrant abdominal pain EXAM: TRANSABDOMINAL AND TRANSVAGINAL ULTRASOUND OF PELVIS DOPPLER ULTRASOUND OF OVARIES TECHNIQUE: Both transabdominal and transvaginal ultrasound examinations of the pelvis were performed. Transabdominal technique was performed for global imaging of the pelvis including uterus, ovaries, adnexal regions, and pelvic cul-de-sac. It was necessary to proceed with endovaginal exam following the transabdominal exam to visualize the ovaries. Color and duplex Doppler ultrasound was utilized to evaluate blood flow to the ovaries. COMPARISON:  None. FINDINGS: Uterus Measurements: 7.1 x 4 x 3.5 cm = volume: 52 mL. The uterus was suboptimally evaluated secondary to patient condition. Endometrium Thickness: 4 mm.  No focal abnormality visualized. Right ovary Measurements: 3.3 x 2.6 x 2.2 cm = volume: 9.7 mL. Normal appearance/no adnexal mass. Left ovary Not visualized Pulsed Doppler evaluation of the right ovary demonstrates normal low-resistance arterial and venous waveforms. The left ovary was not visualized. Other findings No free fluid was visualized on this exam, however there is a small amount of free fluid in the patient's pelvis. IMPRESSION: 1. Very limited study secondary to patient condition. 2. No definite acute  sonographic abnormality detected given this limitation. 3. The left ovary was not visualized. Of note, there is no definite abnormality detected involving the left ovary on the patient's recent CT. 4. No sonographic evidence for ovarian torsion involving the right ovary. 5. No free fluid visualized on this exam, however there is some pelvic free fluid of the patient's recent CT which may be physiologic. Electronically Signed   By: Constance Holster M.D.   On: 08/05/2018 21:59   Ct Abdomen Pelvis W Contrast  Result Date: 08/05/2018 CLINICAL DATA:  Right flank pain. EXAM: CT ABDOMEN AND PELVIS WITH CONTRAST TECHNIQUE: Multidetector CT imaging of the abdomen and pelvis was performed using the standard protocol following bolus administration of intravenous contrast. CONTRAST:  130mL OMNIPAQUE IOHEXOL 300 MG/ML  SOLN COMPARISON:  None. FINDINGS: Lower chest: No acute abnormality. Hepatobiliary: No focal liver abnormality is seen. No gallstones, gallbladder wall thickening, or biliary dilatation. Pancreas: Unremarkable. No pancreatic ductal dilatation or surrounding inflammatory changes. Spleen: Normal in size without focal abnormality. Adrenals/Urinary Tract: Adrenal glands are unremarkable. Kidneys are normal, without renal calculi, focal lesion, or hydronephrosis. Bladder is unremarkable. Stomach/Bowel: Stomach is within normal limits. Appendix appears normal. No evidence of bowel wall thickening, distention, or inflammatory changes. Vascular/Lymphatic: No significant vascular findings are present. No enlarged abdominal or pelvic lymph nodes. Reproductive: Uterus and bilateral adnexa are unremarkable. Other: No abdominal wall hernia or abnormality. No abdominopelvic ascites. Musculoskeletal: No acute or significant osseous findings. IMPRESSION: No evidence of obstructive uropathy or other acute abnormality. Electronically Signed   By: Fidela Salisbury M.D.   On: 08/05/2018 20:43   [place] Art/ven Flow Abd Pelv  Doppler  Result Date: 08/05/2018 CLINICAL DATA:  Right lower quadrant abdominal pain EXAM: TRANSABDOMINAL AND TRANSVAGINAL ULTRASOUND OF PELVIS DOPPLER ULTRASOUND OF OVARIES TECHNIQUE: Both transabdominal and transvaginal ultrasound examinations of  the pelvis were performed. Transabdominal technique was performed for global imaging of the pelvis including uterus, ovaries, adnexal regions, and pelvic cul-de-sac. It was necessary to proceed with endovaginal exam following the transabdominal exam to visualize the ovaries. Color and duplex Doppler ultrasound was utilized to evaluate blood flow to the ovaries. COMPARISON:  None. FINDINGS: Uterus Measurements: 7.1 x 4 x 3.5 cm = volume: 52 mL. The uterus was suboptimally evaluated secondary to patient condition. Endometrium Thickness: 4 mm.  No focal abnormality visualized. Right ovary Measurements: 3.3 x 2.6 x 2.2 cm = volume: 9.7 mL. Normal appearance/no adnexal mass. Left ovary Not visualized Pulsed Doppler evaluation of the right ovary demonstrates normal low-resistance arterial and venous waveforms. The left ovary was not visualized. Other findings No free fluid was visualized on this exam, however there is a small amount of free fluid in the patient's pelvis. IMPRESSION: 1. Very limited study secondary to patient condition. 2. No definite acute sonographic abnormality detected given this limitation. 3. The left ovary was not visualized. Of note, there is no definite abnormality detected involving the left ovary on the patient's recent CT. 4. No sonographic evidence for ovarian torsion involving the right ovary. 5. No free fluid visualized on this exam, however there is some pelvic free fluid of the patient's recent CT which may be physiologic. Electronically Signed   By: Constance Holster M.D.   On: 08/05/2018 21:59    Procedures Procedures (including critical care time)  Medications Ordered in UC Medications - No data to display  Initial Impression /  Assessment and Plan / UC Course  I have reviewed the triage vital signs and the nursing notes.  Pertinent labs & imaging results that were available during my care of the patient were reviewed by me and considered in my medical decision making (see chart for details).     MDM   Pt to Ed for evaluation  Final Clinical Impressions(s) / UC Diagnoses   Final diagnoses:  Right flank pain     Discharge Instructions     Go to the Emergency department for evaltuion    ED Prescriptions    None     Controlled Substance Prescriptions [place] Controlled Substance Registry consulted? Not Applicable  An After Visit Summary was printed and given to the patient.    Fransico Meadow, [place] 08/07/18 1911

## 2018-08-13 ENCOUNTER — Ambulatory Visit: Payer: Self-pay | Admitting: Physical Therapy

## 2018-08-14 ENCOUNTER — Ambulatory Visit (INDEPENDENT_AMBULATORY_CARE_PROVIDER_SITE_OTHER): Payer: Self-pay | Admitting: Primary Care

## 2018-08-14 ENCOUNTER — Encounter (INDEPENDENT_AMBULATORY_CARE_PROVIDER_SITE_OTHER): Payer: Self-pay | Admitting: Primary Care

## 2018-08-14 ENCOUNTER — Other Ambulatory Visit: Payer: Self-pay

## 2018-08-14 VITALS — BP 134/81 | HR 76 | Temp 98.2°F | Ht 61.0 in | Wt 207.6 lb

## 2018-08-14 DIAGNOSIS — R5382 Chronic fatigue, unspecified: Secondary | ICD-10-CM

## 2018-08-14 DIAGNOSIS — G4700 Insomnia, unspecified: Secondary | ICD-10-CM | POA: Insufficient documentation

## 2018-08-14 DIAGNOSIS — F5101 Primary insomnia: Secondary | ICD-10-CM

## 2018-08-14 DIAGNOSIS — R5383 Other fatigue: Secondary | ICD-10-CM | POA: Insufficient documentation

## 2018-08-14 DIAGNOSIS — K5900 Constipation, unspecified: Secondary | ICD-10-CM

## 2018-08-14 DIAGNOSIS — Z09 Encounter for follow-up examination after completed treatment for conditions other than malignant neoplasm: Secondary | ICD-10-CM

## 2018-08-14 DIAGNOSIS — N898 Other specified noninflammatory disorders of vagina: Secondary | ICD-10-CM

## 2018-08-14 DIAGNOSIS — R739 Hyperglycemia, unspecified: Secondary | ICD-10-CM

## 2018-08-14 LAB — POCT GLYCOSYLATED HEMOGLOBIN (HGB A1C): Hemoglobin A1C: 5.9 % — AB (ref 4.0–5.6)

## 2018-08-14 MED ORDER — FLUCONAZOLE 150 MG PO TABS: 150.0000 mg | ORAL_TABLET | Freq: Once | ORAL | 0 refills | Status: AC

## 2018-08-14 MED ORDER — SENNOSIDES-DOCUSATE SODIUM 8.6-50 MG PO TABS: 1.0000 | ORAL_TABLET | Freq: Every day | ORAL | 3 refills | Status: DC

## 2018-08-14 NOTE — Patient Instructions (Signed)

## 2018-08-14 NOTE — Progress Notes (Signed)
Established Patient Office Visit  Subjective:  Patient ID: Michele Trevino, female    DOB: March 06, 1975  Age: 43 y.o. MRN: [id number]  CC:  Chief Complaint  Patient presents with  . Follow-up    abdominal pain    HPI Michele Trevino presents for Emergency room follow up for abdominal pain this has resolved. She completed the course of antibiotics that was prescribed. She now has vaginal discharge with itching.  Past Medical History:  Diagnosis Date  . Arthritis   . Carpal tunnel syndrome     Past Surgical History:  Procedure Laterality Date  . CESAREAN SECTION      Family History  Problem Relation Age of Onset  . Throat cancer Mother   . Diabetes Father   . Hypertension Father     Social History   Socioeconomic History  . Marital status: Married    Spouse name: Not on file  . Number of children: Not on file  . Years of education: Not on file  . Highest education level: Not on file  Occupational History  . Not on file  Social Needs  . Financial resource strain: Not on file  . Food insecurity    Worry: Sometimes true    Inability: Sometimes true  . Transportation needs    Medical: No    Non-medical: No  Tobacco Use  . Smoking status: Former Research scientist (life sciences)  . Smokeless tobacco: Never Used  Substance and Sexual Activity  . Alcohol use: Yes    Frequency: Never    Comment: socially   . Drug use: No  . Sexual activity: Yes    Birth control/protection: Surgical  Lifestyle  . Physical activity    Days per week: Not on file    Minutes per session: Not on file  . Stress: Not on file  Relationships  . Social Herbalist on phone: Not on file    Gets together: Not on file    Attends religious service: Not on file    Active member of club or organization: Not on file    Attends meetings of clubs or organizations: Not on file    Relationship status: Not on file  . Intimate partner violence    Fear of current or ex partner: Not on file   Emotionally abused: Not on file    Physically abused: Not on file    Forced sexual activity: Not on file  Other Topics Concern  . Not on file  Social History Narrative  . Not on file    Outpatient Medications Prior to Visit  Medication Sig Dispense Refill  . albuterol (VENTOLIN HFA) 108 (90 Base) MCG/ACT inhaler Inhale 1-2 puffs into the lungs every 6 (six) hours as needed for wheezing or shortness of breath. 1 each 3  . doxycycline (VIBRAMYCIN) 100 MG capsule Take 1 capsule (100 mg total) by mouth 2 (two) times daily for 14 days. 28 capsule 0  . fluticasone (FLONASE) 50 MCG/ACT nasal spray Place 2 sprays into both nostrils daily. 16 g 6  . loratadine (CLARITIN) 10 MG tablet Take 1 tablet (10 mg total) by mouth daily. 30 tablet 11  . cyclobenzaprine (FLEXERIL) 10 MG tablet Take 1 tablet (10 mg total) by mouth 2 (two) times daily as needed for muscle spasms. 30 tablet 0  . dicyclomine (BENTYL) 20 MG tablet Take 1 tablet (20 mg total) by mouth 2 (two) times daily as needed for spasms. 10 tablet 0  . HYDROcodone-acetaminophen ([place])  7.5-325 MG tablet Take 1 tablet by mouth every 6 (six) hours as needed for moderate pain. 10 tablet 0  . metroNIDAZOLE (FLAGYL) 500 MG tablet Take 1 tablet (500 mg total) by mouth 2 (two) times daily. 14 tablet 0   No facility-administered medications prior to visit.     Allergies  Allergen Reactions  . Latex Swelling    ROS Review of Systems  Constitutional: Negative for fatigue.  HENT: Negative.   Respiratory: Positive for shortness of breath.   Cardiovascular:       Heaviness   Gastrointestinal: Positive for abdominal distention and constipation.  Genitourinary: Negative.   Musculoskeletal: Positive for arthralgias.  Skin: Negative.   Neurological: Negative.   Hematological: Negative.   Psychiatric/Behavioral: Positive for agitation and sleep disturbance.      Objective:    Physical Exam  Constitutional: She appears well-developed and  well-nourished.  Neck: Neck supple.  Cardiovascular: Normal rate.  Pulmonary/Chest: She exhibits tenderness.  Relieved with SABA  Musculoskeletal:     Comments: Arthritis knees chronic pain and lower back pain referred to physical therapy    BP 134/81 (BP Location: Left Arm, Patient Position: Sitting, Cuff Size: Large)   Pulse 76   Temp 98.2 F (36.8 C) (Oral)   Ht 5\' 1"  (1.549 m)   Wt 207 lb 9.6 oz (94.2 kg)   LMP 07/24/2018 (Exact Date)   SpO2 99%   BMI 39.23 kg/m  Wt Readings from Last 3 Encounters:  08/14/18 207 lb 9.6 oz (94.2 kg)  08/05/18 207 lb 7.3 oz (94.1 kg)  04/16/18 207 lb 6.4 oz (94.1 kg)     There are no preventive care reminders to display for this patient.  There are no preventive care reminders to display for this patient.  No results found for: TSH Lab Results  Component Value Date   WBC 10.8 (H) 08/05/2018   HGB 9.8 (L) 08/05/2018   HCT 32.7 (L) 08/05/2018   MCV 80.7 08/05/2018   PLT 550 (H) 08/05/2018   Lab Results  Component Value Date   NA 138 08/05/2018   K 4.4 08/05/2018   CO2 26 08/05/2018   GLUCOSE 97 08/05/2018   BUN <5 (L) 08/05/2018   CREATININE 0.87 08/05/2018   BILITOT 0.6 08/05/2018   ALKPHOS 50 08/05/2018   AST 29 08/05/2018   ALT 20 08/05/2018   PROT 7.2 08/05/2018   ALBUMIN 3.9 08/05/2018   CALCIUM 9.4 08/05/2018   ANIONGAP 8 08/05/2018   Lab Results  Component Value Date   CHOL 172 12/28/2017   Lab Results  Component Value Date   HDL 52 12/28/2017   Lab Results  Component Value Date   LDLCALC 106 (H) 12/28/2017   Lab Results  Component Value Date   TRIG 69 12/28/2017   Lab Results  Component Value Date   CHOLHDL 3.3 12/28/2017   Lab Results  Component Value Date   HGBA1C 5.9 (A) 08/14/2018      Assessment & Plan:   Problem List Items Addressed This Visit    Constipation   Fatigue   Relevant Orders   TSH + free T4   VITAMIN D 25 Hydroxy (Vit-D Deficiency, Fractures)   Insomnia   Vaginal  discharge   Hospital discharge follow-up    Other Visit Diagnoses    Hyperglycemia    -  Primary   Relevant Orders   HgB A1c (Completed)      Meds ordered this encounter  Medications  . fluconazole (DIFLUCAN) 150  MG tablet    Sig: Take 1 tablet (150 mg total) by mouth once for 1 dose. Repeat dose in 2-3 days if symptom continue    Dispense:  2 tablet    Refill:  0  . senna-docusate (SENOKOT-S) 8.6-50 MG tablet    Sig: Take 1 tablet by mouth daily.    Dispense:  30 tablet    Refill:  3  [place] was seen today for follow-up.  Diagnoses and all orders for this visit: Shamaine was seen today for follow-up.  Diagnoses and all orders for this visit:  Hyperglycemia -     HgB A1c  Chronic fatigue Associating factors hair loss, constipation, rule out thyroid disease . Hemoglobin and hematocrit is low. Advised to start multivitamin with iron -     TSH + free T4 -     VITAMIN D 25 Hydroxy (Vit-D Deficiency, Fractures)  Constipation, unspecified constipation type Discussed increased fiber in diet , 64 oz of water and has a hx of hemorrhoids added laxative with stool softer.  Primary insomnia Will start with melatonin over the counter and if not effective will re-eval on f/u. If not receiving ample amount of sleep this can be a contributory factor of fatigue.  Vaginal discharge/itching Previously on antibiotics completed tx with [facility] discharge follow-up Present to the emergency room with complaints of acute onset of increased pain in right upper quadrant. No over the counter medications helped. CT completed no abnormalities noted, wet prep showed clue cells and WBCs tx for BV. Abdominal pain resolved  Other orders -     fluconazole (DIFLUCAN) 150 MG tablet; Take 1 tablet (150 mg total) by mouth once for 1 dose. Repeat dose in 2-3 days if symptom continue -     senna-docusate (SENOKOT-S) 8.6-50 MG tablet; Take 1 tablet by mouth daily.    Other orders -      fluconazole (DIFLUCAN) 150 MG tablet; Take 1 tablet (150 mg total) by mouth once for 1 dose. Repeat dose in 2-3 days if symptom continue -     senna-docusate (SENOKOT-S) 8.6-50 MG tablet; Take 1 tablet by mouth daily.    Follow-up: Return in about 3 months (around 11/14/2018) for Fatigue, obesity , .    Kerin Perna, NP

## 2018-08-15 ENCOUNTER — Other Ambulatory Visit (INDEPENDENT_AMBULATORY_CARE_PROVIDER_SITE_OTHER): Payer: Self-pay | Admitting: Primary Care

## 2018-08-15 LAB — TSH+FREE T4
Free T4: 1.14 ng/dL (ref 0.82–1.77)
TSH: 2.09 u[IU]/mL (ref 0.450–4.500)

## 2018-08-15 LAB — VITAMIN D 25 HYDROXY (VIT D DEFICIENCY, FRACTURES): Vit D, 25-Hydroxy: 23.4 ng/mL — ABNORMAL LOW (ref 30.0–100.0)

## 2018-08-15 MED ORDER — VITAMIN D 50 MCG (2000 UT) PO CAPS: 2000.0000 [IU] | ORAL_CAPSULE | ORAL | 6 refills | Status: AC

## 2018-08-16 ENCOUNTER — Telehealth: Payer: Self-pay

## 2018-08-16 NOTE — Telephone Encounter (Signed)
Patient is aware that labs are normal except vitamin D being low. Vitamin D supplement has been sent to pharmacy. Patient stated PCP mentioned that her iron was low and had suggested something to take but she can not remember what it was. Please send in medication or inform of what patient was advised to take. Nat Christen, CMA

## 2018-08-16 NOTE — Telephone Encounter (Signed)
-----   Message from Kerin Perna, NP sent at 08/15/2018  4:27 PM EDT ----- Labs normal except vit D send supplement in

## 2018-09-04 ENCOUNTER — Ambulatory Visit: Payer: Self-pay | Attending: Primary Care | Admitting: Physical Therapy

## 2018-09-04 ENCOUNTER — Encounter: Payer: Self-pay | Admitting: Physical Therapy

## 2018-09-04 ENCOUNTER — Other Ambulatory Visit: Payer: Self-pay

## 2018-09-04 DIAGNOSIS — M545 Low back pain: Secondary | ICD-10-CM | POA: Insufficient documentation

## 2018-09-04 DIAGNOSIS — G8929 Other chronic pain: Secondary | ICD-10-CM | POA: Insufficient documentation

## 2018-09-04 DIAGNOSIS — M62830 Muscle spasm of back: Secondary | ICD-10-CM | POA: Insufficient documentation

## 2018-09-04 NOTE — Therapy (Signed)
[place], [place], [address] Phone: [phone]   Fax:  [phone]  Physical Therapy Treatment / Discharge Summary  Patient Details  Name: Michele Trevino MRN: [id number] Date of Birth: Apr 11, 1975 Referring Provider (PT): Kerin Perna, NP   Encounter Date: 09/04/2018  PT End of Session - 09/04/18 1546    Visit Number  11    Number of Visits  12    Date for PT Re-Evaluation  09/04/18    Authorization Type  CAFA 75%?    PT Start Time  1546    PT Stop Time  1613    PT Time Calculation (min)  27 min    Activity Tolerance  Patient tolerated treatment well    Behavior During Therapy  WFL for tasks assessed/performed       Past Medical History:  Diagnosis Date  . Arthritis   . Carpal tunnel syndrome     Past Surgical History:  Procedure Laterality Date  . CESAREAN SECTION      There were no vitals filed for this visit.  Subjective Assessment - 09/04/18 1548    Subjective  "I am still feeling pretty sore and the pain will continue to fluctuate. Last week was all bad for me, I not know what is going on, nothing seems to work. 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  Yes    Pain Score  4     Pain Location  Flank    Pain Orientation  Right    Pain Type  Chronic pain    Pain Onset  More than a month ago    Pain Frequency  Intermittent    Aggravating Factors   no specific cause of pain or irritation    Pain Relieving Factors  Nothing seems to help, tried ice, heat, topical         [facility] PT Assessment - 09/04/18 0001      Assessment   Medical Diagnosis  LBP and spasm, mild degenerative changes facet arthropathy L4-5, and lower abdominal pain    Referring Provider (PT)  Kerin Perna, NP      AROM   Overall AROM Comments   ERP and pain during movement with all motions    Lumbar Flexion  50    Lumbar Extension  10    Lumbar - Right Side Bend  10    Lumbar - Left Side Bend  10      Strength   Overall Strength Comments  gross LE strength 4-/5, core strength poor   pain noted during testing                  [facility] Adult PT Treatment/Exercise - 09/04/18 0001      Lumbar Exercises: Stretches   Lower Trunk Rotation Limitations  2 x 10    cues to stay within pain free ROm   Quadruped Mid Back Stretch  1 rep;30 seconds      Lumbar Exercises: Supine   Bent Knee Raise  10 reps;1 second    Other Supine Lumbar Exercises  lumbar decompression position abdominal set 1  x10   increased pain            PT Education - 09/04/18 1612  Education Details  reviewed previously provided HEP and benefits of being proactive vs reactive to reduce tension via stretching, continued working hip/ core strengthening, utilize ice / heat PRN to help modulate pain    Person(s) Educated  Patient    Methods  Explanation;Verbal cues    Comprehension  Verbalized understanding;Verbal cues required          PT Long Term Goals - 09/04/18 1558      PT LONG TERM GOAL #1   Title  Pt will be I and compliant with HEP. (6 weeks 06/05/18)    Baseline  attempting HEP but reports no relief of pain    Period  Weeks    Status  Partially Met      PT LONG TERM GOAL #2   Title  Pt will improve lumbar ROM to WNL 80-100%. (6 weeks 06/05/18)    Baseline  limited ROM due to pain    Period  Weeks    Status  Not Met      PT LONG TERM GOAL #3   Title  Pt will be able to perform modified plank from knees for 30 sec to show improved core strength. (6 weeks 06/05/18)    Baseline  unable to test due to pain/ irritability    Period  Weeks      PT LONG TERM GOAL #4   Title  Pt will improve FOTO to less than 45% limited. (6 weeks 06/05/18)      PT LONG TERM GOAL #5   Title  Pt will report less than 3/10 overall back pain with usual  activity including taking care of her son, housework, and cooking, and going up/down her 2 steps at home. (6 weeks 06/05/18)    Baseline  paying constantly at 4 x10    Period  Weeks    Status  Not Met            Plan - 09/04/18 1605    Clinical Impression Statement  pt presents back to PT after a month since the last visit due to scheduling conflicts and has since been seen at the ED for low back pain. She mas made little to no functional progress with trunk ROM as well as goals. with progression of N/T from RLE to bil LE as well as progressing weakness and pt report of hx of incontinence which has progressivley worsened over the last 2 months is concerning and she would benefit from returning to her referring provider for further assesment.    PT Treatment/Interventions  Aquatic Therapy;Cryotherapy;Electrical Stimulation;Iontophoresis 41m/ml Dexamethasone;Moist Heat;Traction;Ultrasound;Gait training;Stair training;Therapeutic activities;Therapeutic exercise;Balance training;Neuromuscular re-education;Passive range of motion;Dry needling;Taping;Spinal Manipulations;Joint Manipulations    PT Next Visit Plan  D/C    PT Home Exercise Plan  now only gave her prone lying over one pillow, then POE, then press ups, MTPR using tennis ball    Consulted and Agree with Plan of Care  Patient       Patient will benefit from skilled therapeutic intervention in order to improve the following deficits and impairments:     Visit Diagnosis: 1. Chronic bilateral low back pain without sciatica   2. Muscle spasm of back        Problem List Patient Active Problem List   Diagnosis Date Noted  . Constipation 08/14/2018  . Fatigue 08/14/2018  . Insomnia 08/14/2018  . Vaginal discharge 08/14/2018  . Hospital discharge follow-up 08/14/2018  . 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 OBESITY, NOS 04/19/2006  . BLEEDING, RECTAL 04/19/2006  [place] 09/04/2018, 4:16 PM  [facility] [address] [place], [place], [address] Phone: [phone]   Fax:  [phone]  Name: Michele Trevino MRN: [id number] Date of Birth: 07/05/1975        PHYSICAL THERAPY DISCHARGE SUMMARY  Visits from Start of Care: 11  Current functional level related to goals / functional outcomes: See goals, FOTO 75% limited   Remaining deficits: Constant fluctuating pain in the low back with referred N/T down bil LE to the popliteal space. Muscle spasm along bil lumbar paraspinals and progressing weakness in bil LE.    Education / Equipment: HEP, theraband, posture/ lifting mechanics, pain relieving techniques. anatomy  Plan: Patient agrees to discharge.  Patient goals were not met. Patient is being discharged due to lack of progress.  ?????         Alilah Mcmeans PT, DPT, LAT, ATC  09/04/18  4:17 PM

## 2018-09-18 ENCOUNTER — Other Ambulatory Visit: Payer: Self-pay | Admitting: Primary Care

## 2018-09-18 DIAGNOSIS — Z20822 Contact with and (suspected) exposure to covid-19: Secondary | ICD-10-CM

## 2018-09-20 ENCOUNTER — Telehealth (INDEPENDENT_AMBULATORY_CARE_PROVIDER_SITE_OTHER): Payer: Self-pay | Admitting: Primary Care

## 2018-09-20 ENCOUNTER — Other Ambulatory Visit: Payer: Self-pay

## 2018-09-20 DIAGNOSIS — J302 Other seasonal allergic rhinitis: Secondary | ICD-10-CM

## 2018-09-20 LAB — NOVEL CORONAVIRUS, NAA: SARS-CoV-2, NAA: NOT DETECTED

## 2018-09-20 NOTE — Progress Notes (Signed)
Virtual Visit via Telephone Note  I connected with Michele Trevino on 09/20/18 at  8:50 AM EDT by telephone and verified that I am speaking with the correct person using two identifiers.   I discussed the limitations, risks, security and privacy concerns of performing an evaluation and management service by telephone and the availability of in person appointments. I also discussed with the patient that there may be a patient responsible charge related to this service. The patient expressed understanding and agreed to proceed.   History of Present Illness: [name] is concerned that she has a sinus infection she has symptoms of drainage lying down cough and watery eyes. Demonstrated and patient followed how to assess sinus no tenderness over frontal, maxillary, cervical chain explain this is just sinus and to purchase over the counter antihistamine with D component.   Observations/Objective: Review of Systems  HENT: Positive for congestion and sore throat.        Rhinitis   Eyes:       Watery  Neurological: Positive for headaches.  All other systems reviewed and are negative.  Past Medical History:  Diagnosis Date  . Arthritis   . Carpal tunnel syndrome     Follow Up Instructions:  Assessment and Plan Diagnoses and all orders for this visit:  Seasonal allergies Infections are common during spring and fall due to  increased pollination.  Antihistamines can be used daily with intrnasal steroid spray limited to 14 days to avoid rebound effects. May use normal saline nasal spray daily.    I discussed the assessment and treatment plan with the patient. The patient was provided an opportunity to ask questions and all were answered. The patient agreed with the plan and demonstrated an understanding of the instructions.   The patient was advised to call back or seek an in-person evaluation if the symptoms worsen or if the condition fails to improve as anticipated.  I  provided 10 minutes of non-face-to-face time during this encounter.   Kerin Perna, NP

## 2018-11-14 ENCOUNTER — Ambulatory Visit (INDEPENDENT_AMBULATORY_CARE_PROVIDER_SITE_OTHER): Payer: Self-pay | Admitting: Primary Care

## 2018-11-14 ENCOUNTER — Encounter (INDEPENDENT_AMBULATORY_CARE_PROVIDER_SITE_OTHER): Payer: Self-pay | Admitting: Primary Care

## 2018-11-14 ENCOUNTER — Other Ambulatory Visit: Payer: Self-pay

## 2018-11-14 VITALS — BP 131/88 | HR 67 | Temp 97.5°F | Ht 61.0 in | Wt 207.2 lb

## 2018-11-14 DIAGNOSIS — F4323 Adjustment disorder with mixed anxiety and depressed mood: Secondary | ICD-10-CM

## 2018-11-14 DIAGNOSIS — R5383 Other fatigue: Secondary | ICD-10-CM

## 2018-11-14 DIAGNOSIS — Z23 Encounter for immunization: Secondary | ICD-10-CM

## 2018-11-14 MED ORDER — ESCITALOPRAM OXALATE 10 MG PO TABS: 10.0000 mg | ORAL_TABLET | Freq: Every day | ORAL | 3 refills | Status: DC

## 2018-11-14 NOTE — Patient Instructions (Signed)
Vitamin D Deficiency Vitamin D deficiency is when your body does not have enough vitamin D. Vitamin D is important to your body because:  It helps your body use other minerals.  It helps to keep your bones strong and healthy.  It may help to prevent some diseases.  It helps your heart and other muscles work well. Not getting enough vitamin D can make your bones soft. It can also cause other health problems. What are the causes? This condition may be caused by:  Not eating enough foods that contain vitamin D.  Not getting enough sun.  Having diseases that make it hard for your body to absorb vitamin D.  Having a surgery in which a part of the stomach or a part of the small intestine is removed.  Having kidney disease or liver disease. What increases the risk? You are more likely to get this condition if:  You are older.  You do not spend much time outdoors.  You live in a nursing home.  You have had broken bones.  You have weak or thin bones (osteoporosis).  You have a disease or condition that changes how your body absorbs vitamin D.  You have dark skin.  You take certain medicines.  You are overweight or obese. What are the signs or symptoms?  In mild cases, there may not be any symptoms. If the condition is very bad, symptoms may include: ? Bone pain. ? Muscle pain. ? Falling often. ? Broken bones caused by a minor injury. How is this treated? Treatment may include taking supplements as told by your doctor. Your doctor will tell you what dose is best for you. Supplements may include:  Vitamin D.  Calcium. Follow these instructions at home: Eating and drinking   Eat foods that contain vitamin D, such as: ? Dairy products, cereals, or juices with added vitamin D. Check the label. ? Fish, such as salmon or trout. ? Eggs. ? Oysters. ? Mushrooms. The items listed above may not be a complete list of what you can eat and drink. Contact a dietitian for more  options. General instructions  Take medicines and supplements only as told by your doctor.  Get regular, safe exposure to natural sunlight.  Do not use a tanning bed.  Maintain a healthy weight. Lose weight if needed.  Keep all follow-up visits as told by your doctor. This is important. How is this prevented?  You can get vitamin D by: ? Eating foods that naturally contain vitamin D. ? Eating or drinking products that have vitamin D added to them, such as cereals, juices, and milk. ? Taking vitamin D or a multivitamin that contains vitamin D. ? Being in the sun. Your body makes vitamin D when your skin is exposed to sunlight. Your body changes the sunlight into a form of the vitamin that it can use. Contact a doctor if:  Your symptoms do not go away.  You feel sick to your stomach (nauseous).  You throw up (vomit).  You poop less often than normal, or you have trouble pooping (constipation). Summary  Vitamin D deficiency is when your body does not have enough vitamin D.  Vitamin D helps to keep your bones strong and healthy.  This condition is often treated by taking a supplement.  Your doctor will tell you what dose is best for you. This information is not intended to replace advice given to you by your health care provider. Make sure you discuss any questions you  have with your health care provider. Document Released: 01/26/2011 Document Revised: 10/15/2017 Document Reviewed: 10/15/2017 Elsevier Patient Education  2020 Elsevier Inc.  

## 2018-11-14 NOTE — Progress Notes (Signed)
Established Patient Office Visit  Subjective:  Patient ID: Michele Trevino, female    DOB: 04-Jul-1975  Age: 43 y.o. MRN: [id number]  CC:  Chief Complaint  Patient presents with  . Follow-up    fatigue/obesity    HPI Michele Trevino presents for fatigue, obesity and anxiety/depression  Past Medical History:  Diagnosis Date  . Arthritis   . Carpal tunnel syndrome     Past Surgical History:  Procedure Laterality Date  . CESAREAN SECTION      Family History  Problem Relation Age of Onset  . Throat cancer Mother   . Diabetes Father   . Hypertension Father     Social History   Socioeconomic History  . Marital status: Married    Spouse name: Not on file  . Number of children: Not on file  . Years of education: Not on file  . Highest education level: Not on file  Occupational History  . Not on file  Social Needs  . Financial resource strain: Not on file  . Food insecurity    Worry: Sometimes true    Inability: Sometimes true  . Transportation needs    Medical: No    Non-medical: No  Tobacco Use  . Smoking status: Former Research scientist (life sciences)  . Smokeless tobacco: Never Used  Substance and Sexual Activity  . Alcohol use: Yes    Frequency: Never    Comment: socially   . Drug use: No  . Sexual activity: Yes    Birth control/protection: Surgical  Lifestyle  . Physical activity    Days per week: Not on file    Minutes per session: Not on file  . Stress: Not on file  Relationships  . Social Herbalist on phone: Not on file    Gets together: Not on file    Attends religious service: Not on file    Active member of club or organization: Not on file    Attends meetings of clubs or organizations: Not on file    Relationship status: Not on file  . Intimate partner violence    Fear of current or ex partner: Not on file    Emotionally abused: Not on file    Physically abused: Not on file    Forced sexual activity: Not on file  Other Topics  Concern  . Not on file  Social History Narrative  . Not on file    Outpatient Medications Prior to Visit  Medication Sig Dispense Refill  . albuterol (VENTOLIN HFA) 108 (90 Base) MCG/ACT inhaler Inhale 1-2 puffs into the lungs every 6 (six) hours as needed for wheezing or shortness of breath. 1 each 3  . fluticasone (FLONASE) 50 MCG/ACT nasal spray Place 2 sprays into both nostrils daily. 16 g 6  . loratadine (CLARITIN) 10 MG tablet Take 1 tablet (10 mg total) by mouth daily. 30 tablet 11  . senna-docusate (SENOKOT-S) 8.6-50 MG tablet Take 1 tablet by mouth daily. 30 tablet 3   No facility-administered medications prior to visit.     Allergies  Allergen Reactions  . Latex Swelling    ROS Review of Systems    Objective:    Physical Exam  BP 131/88 (BP Location: Left Arm, Patient Position: Sitting, Cuff Size: Large)   Pulse 67   Temp (!) 97.5 F (36.4 C) (Tympanic)   Ht 5\' 1"  (1.549 m)   Wt 207 lb 3.2 oz (94 kg)   LMP 11/06/2018 (Exact Date)   SpO2  97%   BMI 39.15 kg/m  Wt Readings from Last 3 Encounters:  11/14/18 207 lb 3.2 oz (94 kg)  08/14/18 207 lb 9.6 oz (94.2 kg)  08/05/18 207 lb 7.3 oz (94.1 kg)     There are no preventive care reminders to display for this patient.  There are no preventive care reminders to display for this patient.  Lab Results  Component Value Date   TSH 2.090 08/14/2018   Lab Results  Component Value Date   WBC 10.8 (H) 08/05/2018   HGB 9.8 (L) 08/05/2018   HCT 32.7 (L) 08/05/2018   MCV 80.7 08/05/2018   PLT 550 (H) 08/05/2018   Lab Results  Component Value Date   NA 138 08/05/2018   K 4.4 08/05/2018   CO2 26 08/05/2018   GLUCOSE 97 08/05/2018   BUN <5 (L) 08/05/2018   CREATININE 0.87 08/05/2018   BILITOT 0.6 08/05/2018   ALKPHOS 50 08/05/2018   AST 29 08/05/2018   ALT 20 08/05/2018   PROT 7.2 08/05/2018   ALBUMIN 3.9 08/05/2018   CALCIUM 9.4 08/05/2018   ANIONGAP 8 08/05/2018   Lab Results  Component Value  Date   CHOL 172 12/28/2017   Lab Results  Component Value Date   HDL 52 12/28/2017   Lab Results  Component Value Date   LDLCALC 106 (H) 12/28/2017   Lab Results  Component Value Date   TRIG 69 12/28/2017   Lab Results  Component Value Date   CHOLHDL 3.3 12/28/2017   Lab Results  Component Value Date   HGBA1C 5.9 (A) 08/14/2018      Assessment & Plan:  Xya was seen today for follow-up.  Diagnoses and all orders for this visit:  Adjustment disorder with mixed anxiety and depressed mood Depression is when you feel down, blue or sad for at least 2 weeks in a row. You may experience increaset increase in sleeping, eating or  loss of interest in doing things that once gave you pleasure. Feeling worthless, guilty, nervous and low self esteem, avoiding interaction with other people or increase agitation. -     escitalopram (LEXAPRO) 10 MG tablet; Take 1 tablet (10 mg total) by mouth daily.  Need for immunization against influenza CDC recommends influenza vaccine yearly.  Flu intercanthus respiratory illness caused by influenza virus that affects the nose throat and sometimes the lungs.  Experts believe that liver spread managed by tract was made mainly by tiny droplets made when people with the flu cough sneeze or talk. -     Flu Vaccine QUAD 36+ mos IM    Meds ordered this encounter  Medications  . escitalopram (LEXAPRO) 10 MG tablet    Sig: Take 1 tablet (10 mg total) by mouth daily.    Dispense:  30 tablet    Refill:  3    Follow-up: Return in about 6 weeks (around 12/26/2018) for re-evaluate medication started on Lexapro.    Kerin Perna, NP

## 2018-11-19 ENCOUNTER — Encounter (HOSPITAL_COMMUNITY): Payer: Self-pay | Admitting: Emergency Medicine

## 2018-11-19 ENCOUNTER — Emergency Department (HOSPITAL_COMMUNITY)
Admission: EM | Admit: 2018-11-19 | Discharge: 2018-11-20 | Disposition: A | Discharge status: Home / Self Care | Payer: Self-pay | Attending: Emergency Medicine | Admitting: Emergency Medicine

## 2018-11-19 ENCOUNTER — Other Ambulatory Visit: Payer: Self-pay

## 2018-11-19 DIAGNOSIS — Z87891 Personal history of nicotine dependence: Secondary | ICD-10-CM | POA: Insufficient documentation

## 2018-11-19 DIAGNOSIS — N3001 Acute cystitis with hematuria: Secondary | ICD-10-CM

## 2018-11-19 DIAGNOSIS — Z79899 Other long term (current) drug therapy: Secondary | ICD-10-CM | POA: Insufficient documentation

## 2018-11-19 DIAGNOSIS — R300 Dysuria: Secondary | ICD-10-CM

## 2018-11-19 LAB — URINALYSIS, ROUTINE W REFLEX MICROSCOPIC
Bilirubin Urine: NEGATIVE
Glucose, UA: NEGATIVE mg/dL
Ketones, ur: NEGATIVE mg/dL
Nitrite: POSITIVE — AB
Protein, ur: 30 mg/dL — AB
Specific Gravity, Urine: 1.001 — ABNORMAL LOW (ref 1.005–1.030)
pH: 7 (ref 5.0–8.0)

## 2018-11-19 LAB — PREGNANCY, URINE: Preg Test, Ur: NEGATIVE

## 2018-11-19 NOTE — ED Triage Notes (Signed)
Patient reports dysuria and urinary frequency onset this evening , denies fever or chills .

## 2018-11-20 MED ORDER — PHENAZOPYRIDINE HCL 100 MG PO TABS
95.0000 mg | ORAL_TABLET | Freq: Once | ORAL | Status: AC
  Administered 2018-11-20: 01:00:00 100 mg via ORAL
  Filled 2018-11-20: qty 1

## 2018-11-20 MED ORDER — PHENAZOPYRIDINE HCL 200 MG PO TABS: 200.0000 mg | ORAL_TABLET | Freq: Three times a day (TID) | ORAL | 0 refills | Status: DC | PRN

## 2018-11-20 MED ORDER — CEPHALEXIN 500 MG PO CAPS: 500.0000 mg | ORAL_CAPSULE | Freq: Two times a day (BID) | ORAL | 0 refills | Status: DC

## 2018-11-20 MED ORDER — CEPHALEXIN 250 MG PO CAPS
500.0000 mg | ORAL_CAPSULE | Freq: Once | ORAL | Status: AC
  Administered 2018-11-20: 500 mg via ORAL
  Filled 2018-11-20: qty 2

## 2018-11-20 NOTE — ED Provider Notes (Signed)
[facility] EMERGENCY DEPARTMENT Provider Note   CSN: [id number] Arrival date & time: 11/19/18  2206     History   Chief Complaint Chief Complaint  Patient presents with  . Urinary Frequency    UTI  . Dysuria    HPI Michele Trevino is a 43 y.o. female.     The history is provided by the patient and medical records.  Urinary Frequency  Dysuria    43 year old female presenting to the ED with urinary frequency and dysuria, onset just a few hours ago.  She reports she has felt nauseous but denies any vomiting.  She denies any fever, chills, flank pain, or abdominal pain.  States she feels like her genital area is "on fire".  States this is her second UTI in the past month.  States she is not quite sure why this keeps happening.  She does report she drinks a ton of water.  She denies any pelvic pain or vaginal discharge.  No new sexual partner.  Past Medical History:  Diagnosis Date  . Arthritis   . Carpal tunnel syndrome     Patient Active Problem List   Diagnosis Date Noted  . Constipation 08/14/2018  . Fatigue 08/14/2018  . Insomnia 08/14/2018  . Vaginal discharge 08/14/2018  . Hospital discharge follow-up 08/14/2018  . 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 OBESITY, NOS 04/19/2006  . BLEEDING, RECTAL 04/19/2006    Past Surgical History:  Procedure Laterality Date  . CESAREAN SECTION       OB History    Gravida  4   Para      Term      Preterm      AB      Living  4     SAB      TAB      Ectopic      Multiple      Live Births  4            Home Medications    Prior to Admission medications   Medication Sig Start Date End Date Taking? Authorizing Provider  albuterol (VENTOLIN HFA) 108 (90 Base) MCG/ACT inhaler Inhale 1-2 puffs into the lungs every 6 (six) hours as needed for wheezing or shortness of breath. 06/25/18   Kerin Perna, NP  escitalopram (LEXAPRO) 10 MG tablet Take 1 tablet (10 mg total) by mouth  daily. 11/14/18   Kerin Perna, NP  fluticasone (FLONASE) 50 MCG/ACT nasal spray Place 2 sprays into both nostrils daily. 06/25/18   Kerin Perna, NP  loratadine (CLARITIN) 10 MG tablet Take 1 tablet (10 mg total) by mouth daily. 06/25/18   Kerin Perna, NP    Family History Family History  Problem Relation Age of Onset  . Throat cancer Mother   . Diabetes Father   . Hypertension Father     Social History Social History   Tobacco Use  . Smoking status: Former Research scientist (life sciences)  . Smokeless tobacco: Never Used  Substance Use Topics  . Alcohol use: Yes    Frequency: Never    Comment: socially   . Drug use: No     Allergies   Latex   Review of Systems Review of Systems  Genitourinary: Positive for dysuria and frequency.  All other systems reviewed and are negative.    Physical Exam Updated Vital Signs BP 129/85 (BP Location: Left Arm)   Pulse 81   Temp 98.4 F (36.9 C) (Oral)   Resp 17  LMP 11/06/2018 (Exact Date)   Physical Exam Vitals signs and nursing note reviewed.  Constitutional:      Appearance: She is well-developed.  HENT:     Head: Normocephalic and atraumatic.  Eyes:     Conjunctiva/sclera: Conjunctivae normal.     Pupils: Pupils are equal, round, and reactive to light.  Neck:     Musculoskeletal: Normal range of motion.  Cardiovascular:     Rate and Rhythm: Normal rate and regular rhythm.     Heart sounds: Normal heart sounds.  Pulmonary:     Effort: Pulmonary effort is normal.     Breath sounds: Normal breath sounds.  Abdominal:     General: Bowel sounds are normal.     Palpations: Abdomen is soft.     Tenderness: There is no abdominal tenderness. There is no right CVA tenderness, left CVA tenderness or rebound.     Comments: Soft, nontender, no CVA tenderness  Musculoskeletal: Normal range of motion.  Skin:    General: Skin is warm and dry.  Neurological:     Mental Status: She is alert and oriented to person, place, and time.       ED Treatments / Results  Labs (all labs ordered are listed, but only abnormal results are displayed) Labs Reviewed  URINALYSIS, ROUTINE W REFLEX MICROSCOPIC - Abnormal; Notable for the following components:      Result Value   Color, Urine AMBER (*)    Specific Gravity, Urine 1.001 (*)    Hgb urine dipstick LARGE (*)    Protein, ur 30 (*)    Nitrite POSITIVE (*)    Leukocytes,Ua SMALL (*)    Bacteria, UA RARE (*)    All other components within normal limits  PREGNANCY, URINE    EKG None  Radiology No results found.  Procedures Procedures (including critical care time)  Medications Ordered in ED Medications  phenazopyridine (PYRIDIUM) tablet 100 mg (100 mg Oral Given 11/20/18 0032)  cephALEXin (KEFLEX) capsule 500 mg (500 mg Oral Given 11/20/18 0033)     Initial Impression / Assessment and Plan / ED Course  I have reviewed the triage vital signs and the nursing notes.  Pertinent labs & imaging results that were available during my care of the patient were reviewed by me and considered in my medical decision making (see chart for details).  43 year old female presenting to the ED with dysuria and urinary frequency beginning just a few hours prior to arrival.  She reports this is second UTI in the past month.  She denies any pelvic pain or vaginal discharge.  No new sexual partner or concern for STD.  She is afebrile and nontoxic.  Abdomen is soft and benign without CVA tenderness.  Vitals are stable.  UA does appear infectious with bacteria and positive nitrites.  Culture pending.  Will treat with course of Keflex and Pyridium, last urine culture grew out E. Coli which was sensitive to cephalosporins.  She will need close follow-up with PCP.  Given recurrent UTI, given her information for urology if desired.  Return here for any new or acute changes.  Final Clinical Impressions(s) / ED Diagnoses   Final diagnoses:  Acute cystitis with hematuria  Dysuria    ED  Discharge Orders         Ordered    cephALEXin (KEFLEX) 500 MG capsule  2 times daily     11/20/18 0047    phenazopyridine (PYRIDIUM) 200 MG tablet  3 times daily PRN  11/20/18 [place],  M, PA-C 11/20/18 0050    Fatima Blank, MD 11/20/18 [phone]

## 2018-11-20 NOTE — ED Notes (Signed)
Discharge instructions reviewed, pt verbalized understanding.

## 2018-11-20 NOTE — Discharge Instructions (Signed)
Take the prescribed medication as directed.  Continue drinking lots of water and remember the other things we talked about to try and help prevent UTI (urinating after intercourse, cotton underwear, etc). Follow-up with your primary care doctor.  If you continue getting frequent infection, it may be beneficial to follow-up with urology.  I have attached their contact information as well. Return to the ED for new or worsening symptoms.

## 2018-11-21 LAB — URINE CULTURE: Culture: NO GROWTH

## 2018-11-28 ENCOUNTER — Other Ambulatory Visit: Payer: Self-pay

## 2018-11-28 ENCOUNTER — Encounter (INDEPENDENT_AMBULATORY_CARE_PROVIDER_SITE_OTHER): Payer: Self-pay | Admitting: Primary Care

## 2018-11-28 ENCOUNTER — Ambulatory Visit (INDEPENDENT_AMBULATORY_CARE_PROVIDER_SITE_OTHER): Payer: Self-pay | Admitting: Primary Care

## 2018-11-28 ENCOUNTER — Telehealth (INDEPENDENT_AMBULATORY_CARE_PROVIDER_SITE_OTHER): Payer: Self-pay

## 2018-11-28 VITALS — BP 133/90 | HR 81 | Temp 97.1°F | Ht 61.0 in | Wt 208.4 lb

## 2018-11-28 DIAGNOSIS — N390 Urinary tract infection, site not specified: Secondary | ICD-10-CM

## 2018-11-28 DIAGNOSIS — N3941 Urge incontinence: Secondary | ICD-10-CM

## 2018-11-28 DIAGNOSIS — R319 Hematuria, unspecified: Secondary | ICD-10-CM

## 2018-11-28 DIAGNOSIS — R350 Frequency of micturition: Secondary | ICD-10-CM

## 2018-11-28 LAB — POCT URINALYSIS DIP (CLINITEK)
Bilirubin, UA: NEGATIVE
Glucose, UA: NEGATIVE mg/dL
Ketones, POC UA: NEGATIVE mg/dL
Nitrite, UA: NEGATIVE
POC PROTEIN,UA: NEGATIVE
Spec Grav, UA: 1.01 (ref 1.010–1.025)
Urobilinogen, UA: 0.2 E.U./dL
pH, UA: 6.5 (ref 5.0–8.0)

## 2018-11-28 MED ORDER — OXYBUTYNIN CHLORIDE ER 5 MG PO TB24: 5.0000 mg | ORAL_TABLET | Freq: Every day | ORAL | 0 refills | Status: DC

## 2018-11-28 MED ORDER — PHENAZOPYRIDINE HCL 100 MG PO TABS: 100.0000 mg | ORAL_TABLET | Freq: Three times a day (TID) | ORAL | 0 refills | Status: DC | PRN

## 2018-11-28 NOTE — Telephone Encounter (Signed)
Called and offered patient an appointment for today at 1:30. She is unable to come due to lack of child care. Will call patient if someone cancels for tomorrow, she is able to come in if its an early appointment. Nat Christen, CMA

## 2018-11-28 NOTE — Telephone Encounter (Signed)
Patient was able to find child care is scheduled for today at 1:30. Nat Christen, CMA

## 2018-11-28 NOTE — Telephone Encounter (Signed)
Patient called to inform that she recently finished her medication for a UTI and is not feeling well. She does not want to go back to the ED. Patient schedule an appointment on 12-03-18 @ 4:10pm.  Please advice [phone]  Thank you Whitney Post

## 2018-11-28 NOTE — Progress Notes (Signed)
Established Patient Office Visit  Subjective:  Patient ID: Michele Trevino, female    DOB: Sep 27, 1975  Age: 43 y.o. MRN: [id number]  CC:  Chief Complaint  Patient presents with  . Urinary Frequency    HPI Michele Trevino presents for Urinary frequency and pain. Also very concern she is unable to hold her urine.   Past Medical History:  Diagnosis Date  . Arthritis   . Carpal tunnel syndrome     Past Surgical History:  Procedure Laterality Date  . CESAREAN SECTION      Family History  Problem Relation Age of Onset  . Throat cancer Mother   . Diabetes Father   . Hypertension Father     Social History   Socioeconomic History  . Marital status: Married    Spouse name: Not on file  . Number of children: Not on file  . Years of education: Not on file  . Highest education level: Not on file  Occupational History  . Not on file  Social Needs  . Financial resource strain: Not on file  . Food insecurity    Worry: Sometimes true    Inability: Sometimes true  . Transportation needs    Medical: No    Non-medical: No  Tobacco Use  . Smoking status: Former Research scientist (life sciences)  . Smokeless tobacco: Never Used  Substance and Sexual Activity  . Alcohol use: Yes    Frequency: Never    Comment: socially   . Drug use: No  . Sexual activity: Yes    Birth control/protection: Surgical  Lifestyle  . Physical activity    Days per week: Not on file    Minutes per session: Not on file  . Stress: Not on file  Relationships  . Social Herbalist on phone: Not on file    Gets together: Not on file    Attends religious service: Not on file    Active member of club or organization: Not on file    Attends meetings of clubs or organizations: Not on file    Relationship status: Not on file  . Intimate partner violence    Fear of current or ex partner: Not on file    Emotionally abused: Not on file    Physically abused: Not on file    Forced sexual activity: Not  on file  Other Topics Concern  . Not on file  Social History Narrative  . Not on file    Outpatient Medications Prior to Visit  Medication Sig Dispense Refill  . albuterol (VENTOLIN HFA) 108 (90 Base) MCG/ACT inhaler Inhale 1-2 puffs into the lungs every 6 (six) hours as needed for wheezing or shortness of breath. 1 each 3  . cephALEXin (KEFLEX) 500 MG capsule Take 1 capsule (500 mg total) by mouth 2 (two) times daily. 14 capsule 0  . escitalopram (LEXAPRO) 10 MG tablet Take 1 tablet (10 mg total) by mouth daily. 30 tablet 3  . fluticasone (FLONASE) 50 MCG/ACT nasal spray Place 2 sprays into both nostrils daily. 16 g 6  . loratadine (CLARITIN) 10 MG tablet Take 1 tablet (10 mg total) by mouth daily. 30 tablet 11  . phenazopyridine (PYRIDIUM) 200 MG tablet Take 1 tablet (200 mg total) by mouth 3 (three) times daily as needed for pain. 9 tablet 0   No facility-administered medications prior to visit.     Allergies  Allergen Reactions  . Latex Swelling    ROS Review of Systems  Genitourinary: Positive for difficulty urinating, dyspareunia, dysuria and urgency.  Musculoskeletal: Positive for back pain.      Objective:    Physical Exam  Constitutional: She is oriented to person, place, and time. She appears well-developed and well-nourished.  Neck: Neck supple.  Cardiovascular: Normal rate and regular rhythm.  Pulmonary/Chest: Effort normal and breath sounds normal.  Abdominal: Soft. Bowel sounds are normal.  Musculoskeletal: Normal range of motion.  Neurological: She is oriented to person, place, and time.  Skin: Skin is warm.  Psychiatric: She has a normal mood and affect.    BP 133/90 (BP Location: Left Arm, Patient Position: Sitting, Cuff Size: Large)   Pulse 81   Temp (!) 97.1 F (36.2 C) (Temporal)   Ht 5\' 1"  (1.549 m)   Wt 208 lb 6.4 oz (94.5 kg)   LMP 11/06/2018 (Exact Date)   SpO2 97%   BMI 39.38 kg/m  Wt Readings from Last 3 Encounters:  11/28/18 208 lb  6.4 oz (94.5 kg)  11/14/18 207 lb 3.2 oz (94 kg)  08/14/18 207 lb 9.6 oz (94.2 kg)     There are no preventive care reminders to display for this patient.  There are no preventive care reminders to display for this patient.  Lab Results  Component Value Date   TSH 2.090 08/14/2018   Lab Results  Component Value Date   WBC 10.8 (H) 08/05/2018   HGB 9.8 (L) 08/05/2018   HCT 32.7 (L) 08/05/2018   MCV 80.7 08/05/2018   PLT 550 (H) 08/05/2018   Lab Results  Component Value Date   NA 138 08/05/2018   K 4.4 08/05/2018   CO2 26 08/05/2018   GLUCOSE 97 08/05/2018   BUN <5 (L) 08/05/2018   CREATININE 0.87 08/05/2018   BILITOT 0.6 08/05/2018   ALKPHOS 50 08/05/2018   AST 29 08/05/2018   ALT 20 08/05/2018   PROT 7.2 08/05/2018   ALBUMIN 3.9 08/05/2018   CALCIUM 9.4 08/05/2018   ANIONGAP 8 08/05/2018   Lab Results  Component Value Date   CHOL 172 12/28/2017   Lab Results  Component Value Date   HDL 52 12/28/2017   Lab Results  Component Value Date   LDLCALC 106 (H) 12/28/2017   Lab Results  Component Value Date   TRIG 69 12/28/2017   Lab Results  Component Value Date   CHOLHDL 3.3 12/28/2017   Lab Results  Component Value Date   HGBA1C 5.9 (A) 08/14/2018      Assessment & Plan:   Lorre was seen today for urinary frequency.  Diagnoses and all orders for this visit:  Urinary frequency -     POCT URINALYSIS DIP (CLINITEK) -     Ambulatory referral to Urology  Urinary tract infection with hematuria, site unspecified -     Urine Culture -     Ambulatory referral to Urology  Urge incontinence of urine -     Ambulatory referral to Urology  Other orders -     Discontinue: oxybutynin (DITROPAN XL) 5 MG 24 hr tablet; Take 1 tablet (5 mg total) by mouth at bedtime. -     phenazopyridine (PYRIDIUM) 100 MG tablet; Take 1 tablet (100 mg total) by mouth 3 (three) times daily as needed for pain.    Meds ordered this encounter  Medications  . oxybutynin  (DITROPAN XL) 5 MG 24 hr tablet    Sig: Take 1 tablet (5 mg total) by mouth at bedtime.    Dispense:  30  tablet    Refill:  0  . phenazopyridine (PYRIDIUM) 100 MG tablet    Sig: Take 1 tablet (100 mg total) by mouth 3 (three) times daily as needed for pain.    Dispense:  10 tablet    Refill:  0    Follow-up: Return in about 4 weeks (around 12/26/2018) for Urinary incontence .    Kerin Perna, NP

## 2018-11-28 NOTE — Patient Instructions (Signed)

## 2018-11-29 ENCOUNTER — Telehealth (INDEPENDENT_AMBULATORY_CARE_PROVIDER_SITE_OTHER): Payer: Self-pay

## 2018-11-29 DIAGNOSIS — K921 Melena: Secondary | ICD-10-CM

## 2018-11-29 NOTE — Telephone Encounter (Signed)
FWD to PCP. Tempestt S Roberts, CMA  

## 2018-11-29 NOTE — Telephone Encounter (Signed)
Patient called to inform that she just had a bowel movement this morning and had blood streaks on the toilet paper. Patient would like for PCP to advice her in regards to having to worry and is she has to do anything.  Please advise [phone]  Thank you Whitney Post

## 2018-11-30 LAB — URINE CULTURE

## 2018-12-01 ENCOUNTER — Encounter (HOSPITAL_COMMUNITY): Payer: Self-pay | Admitting: Emergency Medicine

## 2018-12-01 ENCOUNTER — Other Ambulatory Visit (INDEPENDENT_AMBULATORY_CARE_PROVIDER_SITE_OTHER): Payer: Self-pay | Admitting: Primary Care

## 2018-12-01 ENCOUNTER — Emergency Department (HOSPITAL_COMMUNITY)
Admission: EM | Admit: 2018-12-01 | Discharge: 2018-12-01 | Disposition: A | Discharge status: Home / Self Care | Payer: Self-pay | Attending: Emergency Medicine | Admitting: Emergency Medicine

## 2018-12-01 ENCOUNTER — Other Ambulatory Visit: Payer: Self-pay

## 2018-12-01 ENCOUNTER — Emergency Department (HOSPITAL_COMMUNITY): Payer: Self-pay

## 2018-12-01 DIAGNOSIS — N898 Other specified noninflammatory disorders of vagina: Secondary | ICD-10-CM | POA: Insufficient documentation

## 2018-12-01 DIAGNOSIS — Z91040 Latex allergy status: Secondary | ICD-10-CM | POA: Insufficient documentation

## 2018-12-01 DIAGNOSIS — R102 Pelvic and perineal pain: Secondary | ICD-10-CM | POA: Insufficient documentation

## 2018-12-01 DIAGNOSIS — N730 Acute parametritis and pelvic cellulitis: Secondary | ICD-10-CM

## 2018-12-01 DIAGNOSIS — Z87891 Personal history of nicotine dependence: Secondary | ICD-10-CM | POA: Insufficient documentation

## 2018-12-01 DIAGNOSIS — N739 Female pelvic inflammatory disease, unspecified: Secondary | ICD-10-CM | POA: Insufficient documentation

## 2018-12-01 LAB — COMPREHENSIVE METABOLIC PANEL
ALT: 16 U/L (ref 0–44)
AST: 22 U/L (ref 15–41)
Albumin: 3.5 g/dL (ref 3.5–5.0)
Alkaline Phosphatase: 44 U/L (ref 38–126)
Anion gap: 13 (ref 5–15)
BUN: 7 mg/dL (ref 6–20)
CO2: 20 mmol/L — ABNORMAL LOW (ref 22–32)
Calcium: 8.9 mg/dL (ref 8.9–10.3)
Chloride: 107 mmol/L (ref 98–111)
Creatinine, Ser: 0.84 mg/dL (ref 0.44–1.00)
GFR calc Af Amer: >60 mL/min (ref >60)
GFR calc non Af Amer: >60 mL/min (ref >60)
Glucose, Bld: 99 mg/dL (ref 70–99)
Potassium: 4.4 mmol/L (ref 3.5–5.1)
Sodium: 140 mmol/L (ref 135–145)
Total Bilirubin: 0.2 mg/dL — ABNORMAL LOW (ref 0.3–1.2)
Total Protein: 6.9 g/dL (ref 6.5–8.1)

## 2018-12-01 LAB — URINALYSIS, ROUTINE W REFLEX MICROSCOPIC
Bilirubin Urine: NEGATIVE
Glucose, UA: NEGATIVE mg/dL
Ketones, ur: NEGATIVE mg/dL
Nitrite: NEGATIVE
Protein, ur: NEGATIVE mg/dL
Specific Gravity, Urine: 1.009 (ref 1.005–1.030)
pH: 7 (ref 5.0–8.0)

## 2018-12-01 LAB — CBC WITH DIFFERENTIAL/PLATELET
Abs Immature Granulocytes: 0.02 10*3/uL (ref 0.00–0.07)
Basophils Absolute: 0 10*3/uL (ref 0.0–0.1)
Basophils Relative: 0 %
Eosinophils Absolute: 0.4 10*3/uL (ref 0.0–0.5)
Eosinophils Relative: 5 %
HCT: 35.3 % — ABNORMAL LOW (ref 36.0–46.0)
Hemoglobin: 10 g/dL — ABNORMAL LOW (ref 12.0–15.0)
Immature Granulocytes: 0 %
Lymphocytes Relative: 38 %
Lymphs Abs: 3.4 10*3/uL (ref 0.7–4.0)
MCH: 23.9 pg — ABNORMAL LOW (ref 26.0–34.0)
MCHC: 28.3 g/dL — ABNORMAL LOW (ref 30.0–36.0)
MCV: 84.4 fL (ref 80.0–100.0)
Monocytes Absolute: 0.6 10*3/uL (ref 0.1–1.0)
Monocytes Relative: 7 %
Neutro Abs: 4.3 10*3/uL (ref 1.7–7.7)
Neutrophils Relative %: 50 %
Platelets: 559 10*3/uL — ABNORMAL HIGH (ref 150–400)
RBC: 4.18 MIL/uL (ref 3.87–5.11)
RDW: 16.5 % — ABNORMAL HIGH (ref 11.5–15.5)
WBC: 8.7 10*3/uL (ref 4.0–10.5)
nRBC: 0 % (ref 0.0–0.2)

## 2018-12-01 LAB — WET PREP, GENITAL
Clue Cells Wet Prep HPF POC: NONE SEEN
Sperm: NONE SEEN
Trich, Wet Prep: NONE SEEN
Yeast Wet Prep HPF POC: NONE SEEN

## 2018-12-01 LAB — LIPASE, BLOOD: Lipase: 27 U/L (ref 11–51)

## 2018-12-01 LAB — POC URINE PREG, ED: Preg Test, Ur: NEGATIVE

## 2018-12-01 MED ORDER — FLUCONAZOLE 150 MG PO TABS
150.0000 mg | ORAL_TABLET | Freq: Once | ORAL | Status: AC
  Administered 2018-12-01: 11:00:00 150 mg via ORAL
  Filled 2018-12-01: qty 1

## 2018-12-01 MED ORDER — METRONIDAZOLE 500 MG PO TABS: 500.0000 mg | ORAL_TABLET | Freq: Two times a day (BID) | ORAL | 0 refills | Status: DC

## 2018-12-01 MED ORDER — DOXYCYCLINE HYCLATE 100 MG PO CAPS: 100.0000 mg | ORAL_CAPSULE | Freq: Two times a day (BID) | ORAL | 0 refills | Status: DC

## 2018-12-01 MED ORDER — FLUCONAZOLE 150 MG PO TABS: 150.0000 mg | ORAL_TABLET | Freq: Once | ORAL | 0 refills | Status: AC

## 2018-12-01 MED ORDER — METRONIDAZOLE 500 MG PO TABS
500.0000 mg | ORAL_TABLET | Freq: Once | ORAL | Status: AC
  Administered 2018-12-01: 11:00:00 500 mg via ORAL
  Filled 2018-12-01: qty 1

## 2018-12-01 MED ORDER — IBUPROFEN 600 MG PO TABS: 600.0000 mg | ORAL_TABLET | Freq: Four times a day (QID) | ORAL | 0 refills | Status: DC | PRN

## 2018-12-01 MED ORDER — LIDOCAINE HCL (PF) 1 % IJ SOLN
INTRAMUSCULAR | Status: AC
  Administered 2018-12-01: 12:00:00
  Filled 2018-12-01: qty 5

## 2018-12-01 MED ORDER — DOXYCYCLINE HYCLATE 100 MG PO TABS
100.0000 mg | ORAL_TABLET | Freq: Once | ORAL | Status: AC
  Administered 2018-12-01: 100 mg via ORAL
  Filled 2018-12-01: qty 1

## 2018-12-01 MED ORDER — FLUCONAZOLE 150 MG PO TABS: 150.0000 mg | ORAL_TABLET | Freq: Every day | ORAL | 0 refills | Status: DC

## 2018-12-01 MED ORDER — DOXYCYCLINE HYCLATE 100 MG PO CAPS: 100.0000 mg | ORAL_CAPSULE | Freq: Two times a day (BID) | ORAL | 0 refills | Status: AC

## 2018-12-01 MED ORDER — METRONIDAZOLE 500 MG PO TABS: 500.0000 mg | ORAL_TABLET | Freq: Two times a day (BID) | ORAL | 0 refills | Status: AC

## 2018-12-01 MED ORDER — CEFTRIAXONE SODIUM 250 MG IJ SOLR
250.0000 mg | Freq: Once | INTRAMUSCULAR | Status: AC
  Administered 2018-12-01: 11:00:00 250 mg via INTRAMUSCULAR
  Filled 2018-12-01: qty 250

## 2018-12-01 MED ORDER — KETOROLAC TROMETHAMINE 30 MG/ML IJ SOLN
30.0000 mg | Freq: Once | INTRAMUSCULAR | Status: AC
  Administered 2018-12-01: 09:00:00 30 mg via INTRAMUSCULAR
  Filled 2018-12-01: qty 1

## 2018-12-01 MED ORDER — CIPROFLOXACIN HCL 500 MG PO TABS: 500.0000 mg | ORAL_TABLET | Freq: Two times a day (BID) | ORAL | 0 refills | Status: DC

## 2018-12-01 MED ORDER — KETOROLAC TROMETHAMINE 30 MG/ML IJ SOLN
30.0000 mg | Freq: Once | INTRAMUSCULAR | Status: DC
  Filled 2018-12-01: qty 1

## 2018-12-01 NOTE — ED Triage Notes (Signed)
Pt here with abd pain. Pt hx of recurrent UTIs. Was seen in the ED two weeks ago and was put on antibiotics. Pt states she just aint right down there. Pain is a 10. Denies vag bleeding. Pt co dryness and itchiness in vag area.

## 2018-12-01 NOTE — ED Provider Notes (Signed)
[facility] EMERGENCY DEPARTMENT Provider Note   CSN: [id number] Arrival date & time: 12/01/18  0744     History   Chief Complaint Chief Complaint  Patient presents with   Abdominal Pain    HPI Michele Trevino is a 43 y.o. female.     Michele Trevino is a 43 y.o. female with a history of arthritis, uterine fibroids, and recurrent UTIs, who presents to the ED for evaluation of lower abdominal and pelvic pain. Patient reports she was seen on 9/29 in the ED and diagnosed with a UTI, she was treated with Keflex and reports that her symptoms significantly improved and she no longer has any dysuria, but reports on Wednesday she felt like she was starting to get a yeast infection.  She reports that she purchased [facility] brand Monistat and that caused a lot of burning and irritation and since then she reports "something just is not right down there".  She reports that it feels very dry and uncomfortable and she has been having constant and worsening lower abdominal and pelvic pain.  She denies vaginal discharge or bleeding.  She denies any nausea or vomiting.  No diarrhea, constipation, melena or hematochezia.  No fevers or chills.  No associated chest pain, shortness of breath, or cough.  No other aggravating or alleviating factors.  Reports she is sexually active with one partner, denies concern for STD.     Past Medical History:  Diagnosis Date   Arthritis    Carpal tunnel syndrome     Patient Active Problem List   Diagnosis Date Noted   Constipation 08/14/2018   Fatigue 08/14/2018   Insomnia 08/14/2018   Vaginal discharge 08/14/2018   Hospital discharge follow-up 08/14/2018   UTERINE FIBROID 04/19/2006   OBESITY, NOS 04/19/2006   BLEEDING, RECTAL 04/19/2006    Past Surgical History:  Procedure Laterality Date   CESAREAN SECTION       OB History    Gravida  4   Para      Term      Preterm      AB      Living  4     SAB      TAB      Ectopic      Multiple      Live Births  4            Home Medications    Prior to Admission medications   Medication Sig Start Date End Date Taking? Authorizing Provider  albuterol (VENTOLIN HFA) 108 (90 Base) MCG/ACT inhaler Inhale 1-2 puffs into the lungs every 6 (six) hours as needed for wheezing or shortness of breath. 06/25/18  Yes Kerin Perna, NP  cetirizine (ZYRTEC) 10 MG tablet Take 10 mg by mouth daily as needed for allergies.   Yes [provider]  fluticasone (FLONASE) 50 MCG/ACT nasal spray Place 2 sprays into both nostrils daily. Patient taking differently: Place 2 sprays into both nostrils daily as needed for allergies.  06/25/18  Yes Kerin Perna, NP  cephALEXin (KEFLEX) 500 MG capsule Take 1 capsule (500 mg total) by mouth 2 (two) times daily. Patient not taking: Reported on 12/01/2018 11/20/18   Larene Pickett, PA-C  escitalopram (LEXAPRO) 10 MG tablet Take 1 tablet (10 mg total) by mouth daily. Patient not taking: Reported on 12/01/2018 11/14/18   Kerin Perna, NP  loratadine (CLARITIN) 10 MG tablet Take 1 tablet (10 mg total) by mouth daily. Patient  not taking: Reported on 12/01/2018 06/25/18   Kerin Perna, NP  phenazopyridine (PYRIDIUM) 100 MG tablet Take 1 tablet (100 mg total) by mouth 3 (three) times daily as needed for pain. Patient not taking: Reported on 12/01/2018 11/28/18   Kerin Perna, NP  phenazopyridine (PYRIDIUM) 200 MG tablet Take 1 tablet (200 mg total) by mouth 3 (three) times daily as needed for pain. Patient not taking: Reported on 12/01/2018 11/20/18   Larene Pickett, PA-C    Family History Family History  Problem Relation Age of Onset   Throat cancer Mother    Diabetes Father    Hypertension Father     Social History Social History   Tobacco Use   Smoking status: Former Smoker   Smokeless tobacco: Never Used  Substance Use Topics   Alcohol use: Yes    Frequency:  Never    Comment: socially    Drug use: No     Allergies   Latex   Review of Systems Review of Systems  Constitutional: Negative for chills and fever.  HENT: Negative.   Respiratory: Negative for cough and shortness of breath.   Cardiovascular: Negative for chest pain.  Gastrointestinal: Positive for abdominal pain. Negative for blood in stool, constipation, diarrhea and vomiting.  Genitourinary: Positive for pelvic pain, vaginal discharge and vaginal pain. Negative for dysuria, flank pain, frequency and hematuria.  Musculoskeletal: Negative for arthralgias and myalgias.  Skin: Negative for color change and rash.  Neurological: Negative for dizziness, syncope and light-headedness.     Physical Exam Updated Vital Signs BP (!) 159/85 (BP Location: Right Arm)    Pulse 65    Temp 98.2 F (36.8 C) (Oral)    Resp 16    LMP 11/06/2018 (Exact Date)    SpO2 99%   Physical Exam Vitals signs and nursing note reviewed.  Constitutional:      General: She is not in acute distress.    Appearance: She is well-developed. She is obese. She is not ill-appearing or diaphoretic.  HENT:     Head: Normocephalic and atraumatic.     Mouth/Throat:     Mouth: Mucous membranes are moist.     Pharynx: Oropharynx is clear.  Eyes:     General:        Right eye: No discharge.        Left eye: No discharge.  Cardiovascular:     Rate and Rhythm: Normal rate and regular rhythm.     Heart sounds: Normal heart sounds. No murmur. No friction rub. No gallop.   Pulmonary:     Effort: Pulmonary effort is normal. No respiratory distress.     Breath sounds: Normal breath sounds.     Comments: Respirations equal and unlabored, patient able to speak in full sentences, lungs clear to auscultation bilaterally Abdominal:     General: Abdomen is flat.     Palpations: Abdomen is soft.     Tenderness: There is abdominal tenderness. There is no right CVA tenderness or left CVA tenderness.     Comments: Abdomen is  soft, nontender, bowel sounds present throughout, there is some tenderness across the lower abdomen extending into the pelvis, there is no guarding, rigidity or peritoneal signs.  No CVA tenderness bilaterally.  Genitourinary:    Comments: Chaperone present during pelvic exam. Patient reports tenderness to palpation over skin and entire external genitalia but there are no lesions or skin changes, no erythema noted. On speculum exam there is erythema of the introitus,  and speculum exam reveals copious amounts of white chunky discharge, erythema of the vaginal walls. On bimanual exam patient has discomfort throughout but focal tenderness over the right adnexa without palpable masses.  No cervical motion tenderness. Skin:    General: Skin is warm and dry.  Neurological:     Mental Status: She is alert and oriented to person, place, and time.     Coordination: Coordination normal.     Comments: Speech is clear, able to follow commands Moves extremities without ataxia, coordination intact  Psychiatric:        Mood and Affect: Mood is anxious.        Behavior: Behavior normal.      ED Treatments / Results  Labs (all labs ordered are listed, but only abnormal results are displayed) Labs Reviewed  WET PREP, GENITAL - Abnormal; Notable for the following components:      Result Value   WBC, Wet Prep HPF POC MANY (*)    All other components within normal limits  COMPREHENSIVE METABOLIC PANEL - Abnormal; Notable for the following components:   CO2 20 (*)    Total Bilirubin 0.2 (*)    All other components within normal limits  CBC WITH DIFFERENTIAL/PLATELET - Abnormal; Notable for the following components:   Hemoglobin 10.0 (*)    HCT 35.3 (*)    MCH 23.9 (*)    MCHC 28.3 (*)    RDW 16.5 (*)    Platelets 559 (*)    All other components within normal limits  URINALYSIS, ROUTINE W REFLEX MICROSCOPIC - Abnormal; Notable for the following components:   Color, Urine STRAW (*)    Hgb urine  dipstick SMALL (*)    Leukocytes,Ua MODERATE (*)    Bacteria, UA RARE (*)    All other components within normal limits  URINE CULTURE  LIPASE, BLOOD  POC URINE PREG, ED  GC/CHLAMYDIA PROBE AMP ([place]) NOT AT [facility]    EKG None  Radiology US Transvaginal Non-ob  Result Date: 12/01/2018 CLINICAL DATA:  Pelvic pain. EXAM: TRANSABDOMINAL AND TRANSVAGINAL ULTRASOUND OF PELVIS DOPPLER ULTRASOUND OF OVARIES TECHNIQUE: Both transabdominal and transvaginal ultrasound examinations of the pelvis were performed. Transabdominal technique was performed for global imaging of the pelvis including uterus, ovaries, adnexal regions, and pelvic cul-de-sac. It was necessary to proceed with endovaginal exam following the transabdominal exam to visualize the endometrium and ovaries. Color and duplex Doppler ultrasound was utilized to evaluate blood flow to the ovaries. COMPARISON:  None. FINDINGS: Uterus Measurements: 11.2 x 6.4 x 5.3 cm = volume: 198 mL. No fibroids or other mass visualized. Endometrium Thickness: 10 mm which is within normal limits for premenopausal patient. No focal abnormality visualized. Right ovary Measurements: 3.5 x 2.0 x 1.4 cm = volume: 5 mL. Normal appearance/no adnexal mass. Left ovary Measurements: 3.3 x 2.2 x 1.9 cm = volume: 7 mL. Normal appearance/no adnexal mass. Pulsed Doppler evaluation of both ovaries demonstrates normal low-resistance arterial and venous waveforms. Other findings No abnormal free fluid. IMPRESSION: No definite abnormality seen in the pelvis. Electronically Signed   By: Marijo Conception M.D.   On: 12/01/2018 10:28   US Pelvis Complete  Result Date: 12/01/2018 CLINICAL DATA:  Pelvic pain. EXAM: TRANSABDOMINAL AND TRANSVAGINAL ULTRASOUND OF PELVIS DOPPLER ULTRASOUND OF OVARIES TECHNIQUE: Both transabdominal and transvaginal ultrasound examinations of the pelvis were performed. Transabdominal technique was performed for global imaging of the pelvis including  uterus, ovaries, adnexal regions, and pelvic cul-de-sac. It was necessary to proceed with  endovaginal exam following the transabdominal exam to visualize the endometrium and ovaries. Color and duplex Doppler ultrasound was utilized to evaluate blood flow to the ovaries. COMPARISON:  None. FINDINGS: Uterus Measurements: 11.2 x 6.4 x 5.3 cm = volume: 198 mL. No fibroids or other mass visualized. Endometrium Thickness: 10 mm which is within normal limits for premenopausal patient. No focal abnormality visualized. Right ovary Measurements: 3.5 x 2.0 x 1.4 cm = volume: 5 mL. Normal appearance/no adnexal mass. Left ovary Measurements: 3.3 x 2.2 x 1.9 cm = volume: 7 mL. Normal appearance/no adnexal mass. Pulsed Doppler evaluation of both ovaries demonstrates normal low-resistance arterial and venous waveforms. Other findings No abnormal free fluid. IMPRESSION: No definite abnormality seen in the pelvis. Electronically Signed   By: Marijo Conception M.D.   On: 12/01/2018 10:28   [place] Art/ven Flow Abd Pelv Doppler  Result Date: 12/01/2018 CLINICAL DATA:  Pelvic pain. EXAM: TRANSABDOMINAL AND TRANSVAGINAL ULTRASOUND OF PELVIS DOPPLER ULTRASOUND OF OVARIES TECHNIQUE: Both transabdominal and transvaginal ultrasound examinations of the pelvis were performed. Transabdominal technique was performed for global imaging of the pelvis including uterus, ovaries, adnexal regions, and pelvic cul-de-sac. It was necessary to proceed with endovaginal exam following the transabdominal exam to visualize the endometrium and ovaries. Color and duplex Doppler ultrasound was utilized to evaluate blood flow to the ovaries. COMPARISON:  None. FINDINGS: Uterus Measurements: 11.2 x 6.4 x 5.3 cm = volume: 198 mL. No fibroids or other mass visualized. Endometrium Thickness: 10 mm which is within normal limits for premenopausal patient. No focal abnormality visualized. Right ovary Measurements: 3.5 x 2.0 x 1.4 cm = volume: 5 mL. Normal appearance/no  adnexal mass. Left ovary Measurements: 3.3 x 2.2 x 1.9 cm = volume: 7 mL. Normal appearance/no adnexal mass. Pulsed Doppler evaluation of both ovaries demonstrates normal low-resistance arterial and venous waveforms. Other findings No abnormal free fluid. IMPRESSION: No definite abnormality seen in the pelvis. Electronically Signed   By: Marijo Conception M.D.   On: 12/01/2018 10:28    Procedures Procedures (including critical care time)  Medications Ordered in ED Medications  cefTRIAXone (ROCEPHIN) injection 250 mg (has no administration in time range)  doxycycline (VIBRA-TABS) tablet 100 mg (has no administration in time range)  metroNIDAZOLE (FLAGYL) tablet 500 mg (has no administration in time range)  fluconazole (DIFLUCAN) tablet 150 mg (has no administration in time range)  ketorolac (TORADOL) 30 MG/ML injection 30 mg (30 mg Intramuscular Given 12/01/18 0840)     Initial Impression / Assessment and Plan / ED Course  I have reviewed the triage vital signs and the nursing notes.  Pertinent labs & imaging results that were available during my care of the patient were reviewed by me and considered in my medical decision making (see chart for details).  43 year old female reporting vaginal and pelvic discomfort extending into the lower abdomen.  Symptoms started shortly after she completed an antibiotic course for UTI.  She reports she thought she had a yeast infection and used over-the-counter Monistat but symptoms have worsened and she reports increasing discomfort.  On exam she has tenderness across the lower abdomen that does not localize to one side, no guarding or peritoneal signs.  On pelvic exam she has copious amounts of thick white discharge, likely consistent with yeast infection, she has vaginal erythema and irritation, but has significant tenderness on bimanual exam.  Will check labs in addition to wet prep and GC/chlamydia, and will get pelvic ultrasound.  Toradol given for  discomfort.  Labs show no leukocytosis, hemoglobin at baseline, no significant electrolyte derangements, normal renal and liver function and normal lipase.  Wet prep with many WBCs without evidence of yeast or other abnormalities, concerning for potential STD/PID.  Urinalysis no longer show signs of infection,  Pelvic ultrasound shows no acute abnormalities.  Discussed findings with patient, given many WBCs on wet prep and discomfort we will plan to treat for PID, patient reports she has had this previously and felt very similar.  Will treat with Rocephin, doxycycline and Flagyl, will also treat with Diflucan for potential yeast infection given erythema and discomfort.  Discussed return precautions and OB/GYN follow-up.  Patient expresses understanding and agreement with plan.  Discharged home in good condition.   Final Clinical Impressions(s) / ED Diagnoses   Final diagnoses:  None    ED Discharge Orders         Ordered    ibuprofen (ADVIL) 600 MG tablet  Every 6 hours PRN     12/01/18 1057    fluconazole (DIFLUCAN) 150 MG tablet  Daily     12/01/18 1057    metroNIDAZOLE (FLAGYL) 500 MG tablet  2 times daily     12/01/18 1102    doxycycline (VIBRAMYCIN) 100 MG capsule  2 times daily     12/01/18 1102           Jacqlyn Larsen, [place] 12/01/18 [place], [place], DO 12/01/18 1105

## 2018-12-01 NOTE — Discharge Instructions (Signed)
Please take both antibiotics twice daily for the next 2 weeks, take these with food on your stomach to help reduce stomach upset.  Do not drink alcohol while taking Flagyl as it can cause severe nausea and vomiting.  You can use second dose of Diflucan as needed for continued irritation and discharge.  Use Motrin and Tylenol as needed for pain.  Follow-up with your OB/GYN.  You will be contacted by phone of any positive results from STD testing collected today.  Return for any new or worsening symptoms.

## 2018-12-01 NOTE — ED Notes (Signed)
Pt given dc instruction pt verbalizes understanding

## 2018-12-03 ENCOUNTER — Ambulatory Visit (INDEPENDENT_AMBULATORY_CARE_PROVIDER_SITE_OTHER): Payer: Self-pay | Admitting: Primary Care

## 2018-12-03 LAB — URINE CULTURE: Culture: >=100000 — AB

## 2018-12-03 LAB — GC/CHLAMYDIA PROBE AMP (~~LOC~~) NOT AT ARMC
Chlamydia: NEGATIVE
Neisseria Gonorrhea: NEGATIVE

## 2018-12-04 ENCOUNTER — Telehealth: Payer: Self-pay | Admitting: *Deleted

## 2018-12-04 NOTE — Progress Notes (Signed)
ED Antimicrobial Stewardship Positive Culture Follow Up   ANALIE KICE is an 43 y.o. female who presented to [facility] on 12/01/2018 with a chief complaint of  Chief Complaint  Patient presents with  . Abdominal Pain    Recent Results (from the past 720 hour(s))  Urine culture     Status: None   Collection Time: 11/20/18 12:35 AM   Specimen: Urine, Random  Result Value Ref Range Status   Specimen Description URINE, RANDOM  Final   Special Requests NONE  Final   Culture   Final    NO GROWTH Performed at [facility], [address] [place], [place] [address]    Report Status 11/21/2018 FINAL  Final  Urine Culture     Status: Abnormal   Collection Time: 11/28/18  2:52 PM   Specimen: Urine   UR  Result Value Ref Range Status   Urine Culture, Routine Final report (A)  Final   Organism ID, Bacteria Klebsiella pneumoniae (A)  Final    Comment: 10,000-25,000 colony forming units per mL Cefazolin <=4 ug/mL Cefazolin with an MIC <=16 predicts susceptibility to the oral agents cefaclor, cefdinir, cefpodoxime, cefprozil, cefuroxime, cephalexin, and loracarbef when used for therapy of uncomplicated urinary tract infections due to E. coli, Klebsiella pneumoniae, and Proteus mirabilis.    Antimicrobial Susceptibility Comment  Final    Comment:       ** S = Susceptible; I = Intermediate; R = Resistant **                    P = Positive; N = Negative             MICS are expressed in micrograms per mL    Antibiotic                 RSLT#1    RSLT#2    RSLT#3    RSLT#4 Amoxicillin/Clavulanic Acid    S Ampicillin                     R Cefepime                       S Ceftriaxone                    S Cefuroxime                     S Ciprofloxacin                  S Ertapenem                      S Gentamicin                     S Imipenem                       S Levofloxacin                   S Meropenem                      S Nitrofurantoin                  I Piperacillin/Tazobactam        S Tetracycline  S Tobramycin                     S Trimethoprim/Sulfa             S   Wet prep, genital     Status: Abnormal   Collection Time: 12/01/18  8:22 AM   Specimen: Cervical/Vaginal swab  Result Value Ref Range Status   Yeast Wet Prep HPF POC NONE SEEN NONE SEEN Final   Trich, Wet Prep NONE SEEN NONE SEEN Final   Clue Cells Wet Prep HPF POC NONE SEEN NONE SEEN Final   WBC, Wet Prep HPF POC MANY (A) NONE SEEN Final   Sperm NONE SEEN  Final    Comment: Performed at [facility], [place]. [address], [place], [place] [address]  Urine culture     Status: Abnormal   Collection Time: 12/01/18  9:47 AM   Specimen: Urine, Random  Result Value Ref Range Status   Specimen Description URINE, RANDOM  Final   Special Requests   Final    NONE Performed at [facility], [place] [address] [place], [place] [address]    Culture (A)  Final    >=100,000 COLONIES/mL ENTEROBACTER AEROGENES 40,000 COLONIES/mL ESCHERICHIA COLI    Report Status 12/03/2018 FINAL  Final   Organism ID, Bacteria ENTEROBACTER AEROGENES (A)  Final   Organism ID, Bacteria ESCHERICHIA COLI (A)  Final      Susceptibility   Enterobacter aerogenes - MIC*    CEFAZOLIN >=64 RESISTANT Resistant     CEFTRIAXONE <=1 SENSITIVE Sensitive     CIPROFLOXACIN <=0.25 SENSITIVE Sensitive     GENTAMICIN <=1 SENSITIVE Sensitive     IMIPENEM 1 SENSITIVE Sensitive     NITROFURANTOIN 128 RESISTANT Resistant     TRIMETH/SULFA <=20 SENSITIVE Sensitive     PIP/TAZO <=4 SENSITIVE Sensitive     * >=100,000 COLONIES/mL ENTEROBACTER AEROGENES   Escherichia coli - MIC*    AMPICILLIN 4 SENSITIVE Sensitive     CEFAZOLIN <=4 SENSITIVE Sensitive     CEFTRIAXONE <=1 SENSITIVE Sensitive     CIPROFLOXACIN <=0.25 SENSITIVE Sensitive     GENTAMICIN <=1 SENSITIVE Sensitive     IMIPENEM <=0.25 SENSITIVE Sensitive     NITROFURANTOIN <=16 SENSITIVE Sensitive     TRIMETH/SULFA <=20 SENSITIVE  Sensitive     AMPICILLIN/SULBACTAM <=2 SENSITIVE Sensitive     PIP/TAZO <=4 SENSITIVE Sensitive     Extended ESBL NEGATIVE Sensitive     * 40,000 COLONIES/mL ESCHERICHIA COLI    [x]  Treated with doxycline, flagyl and fluconazole for PID and yeast infection, organism resistant to prescribed antimicrobial []  Patient discharged originally without antimicrobial agent and treatment is now indicated  New antibiotic prescription: If pt with UTI symptoms, start cefdinir 300mg  po BID x 5 days  ED Provider: Lenn Sink, PA   Yaritzy Huser, Rande Lawman 12/04/2018, 9:12 AM Clinical Pharmacist Monday - Friday phone -  [phone] Saturday - Sunday phone - [phone]

## 2018-12-04 NOTE — Telephone Encounter (Signed)
Post ED Visit - Positive Culture Follow-up  Culture report reviewed by antimicrobial stewardship pharmacist: [place] Team []  Elenor Quinones, Pharm.D. []  Heide Guile, Pharm.D., BCPS AQ-ID []  Parks Neptune, Pharm.D., BCPS []  Alycia Rossetti, Pharm.D., BCPS []  [place], [place].D., BCPS, AAHIVP []  Legrand Como, Pharm.D., BCPS, AAHIVP [x]  Salome Arnt, PharmD, BCPS []  Johnnette Gourd, PharmD, BCPS []  Hughes Better, PharmD, BCPS []  Leeroy Cha, PharmD []  Laqueta Linden, PharmD, BCPS []  Albertina Parr, PharmD  [place] Team []  Leodis Sias, PharmD []  Lindell Spar, PharmD []  Royetta Asal, PharmD []  Graylin Shiver, Rph []  Rema Fendt) Glennon Mac, PharmD []  Arlyn Dunning, PharmD []  Netta Cedars, PharmD []  Dia Sitter, PharmD []  Leone Haven, PharmD []  Gretta Arab, PharmD []  Theodis Shove, PharmD []  Peggyann Juba, PharmD []  Reuel Boom, PharmD   Positive urine culture Treated with Doxycycline Hyclate. Symptom check states feeling some betther and declined any further antibiotic tx.  No further patient follow-up is required at this time.  Harlon Flor [facility] 12/04/2018, 9:31 AM

## 2018-12-17 ENCOUNTER — Telehealth (INDEPENDENT_AMBULATORY_CARE_PROVIDER_SITE_OTHER): Payer: Self-pay

## 2018-12-17 NOTE — Telephone Encounter (Signed)
FWD to PCP. Tempestt S Roberts, CMA  

## 2018-12-17 NOTE — Telephone Encounter (Signed)
Patient called to inform PCP that she went to the hospital on 12-01-18 and was diagnosed with pelvic inflammatory disease. Patient states that the ed gave her antibiotics and to follow up with her PCP or OB-GYN after she finished her medications. patient wants to know what she should do as her next step.  Please advice [phone]  Thank you Michele Trevino

## 2018-12-26 ENCOUNTER — Telehealth (INDEPENDENT_AMBULATORY_CARE_PROVIDER_SITE_OTHER): Payer: Self-pay | Admitting: Primary Care

## 2018-12-26 DIAGNOSIS — N898 Other specified noninflammatory disorders of vagina: Secondary | ICD-10-CM

## 2018-12-26 DIAGNOSIS — F4323 Adjustment disorder with mixed anxiety and depressed mood: Secondary | ICD-10-CM

## 2018-12-26 DIAGNOSIS — N390 Urinary tract infection, site not specified: Secondary | ICD-10-CM

## 2018-12-26 DIAGNOSIS — R319 Hematuria, unspecified: Secondary | ICD-10-CM

## 2018-12-26 NOTE — Progress Notes (Signed)
Virtual Visit via Telephone Note  I connected with Michele Trevino on 12/26/18 at  8:30 AM EST by telephone and verified that I am speaking with the correct person using two identifiers.   I discussed the limitations, risks, security and privacy concerns of performing an evaluation and management service by telephone and the availability of in person appointments. I also discussed with the patient that there may be a patient responsible charge related to this service. The patient expressed understanding and agreed to proceed.   History of Present Illness: Michele Trevino is having a web visit to evaluate her depression with adding Lexapro 10mg .  Unable to tell if medication is working because her "vaging takes residences" she is continently itching and burning and a pain in her but. Seen in emergency room diagnosed with PID and UTI treated. Gyn appoint schedule for 01/14/2019. Past Medical History:  Diagnosis Date  . Arthritis   . Carpal tunnel syndrome    Observations/Objective: Review of Systems  Genitourinary: Positive for dysuria, frequency and urgency.  All other systems reviewed and are negative.   Assessment and Plan: Diagnoses and all orders for this visit:  Vaginal discharge 12/01/2018 presented to emergency room for lower abdominal and pelvic pain- diagnosed with PID and UTI treated with antibiotics discharge to home follow up with gyn.  Urinary tract infection with hematuria, site unspecified Chronic treated 12/01/2018 referred to urologist from evaluation awaiting appointment.  Adjustment disorder with mixed anxiety and depressed mood Unable to determine if Lexapro made a difference when you are " constantly burning and itching down there". Re-evaluate when patient has seen gyn and urologist.   Follow Up Instructions:    I discussed the assessment and treatment plan with the patient. The patient was provided an opportunity to ask questions and all were  answered. The patient agreed with the plan and demonstrated an understanding of the instructions.   The patient was advised to call back or seek an in-person evaluation if the symptoms worsen or if the condition fails to improve as anticipated.  I provided 15 minutes of non-face-to-face time during this encounter.   Kerin Perna, NP

## 2018-12-27 ENCOUNTER — Other Ambulatory Visit (HOSPITAL_COMMUNITY): Payer: Self-pay | Admitting: *Deleted

## 2018-12-27 DIAGNOSIS — Z1231 Encounter for screening mammogram for malignant neoplasm of breast: Secondary | ICD-10-CM

## 2019-01-14 ENCOUNTER — Encounter: Payer: Self-pay | Admitting: Family Medicine

## 2019-01-14 ENCOUNTER — Ambulatory Visit (INDEPENDENT_AMBULATORY_CARE_PROVIDER_SITE_OTHER): Payer: Self-pay | Admitting: Family Medicine

## 2019-01-14 ENCOUNTER — Encounter

## 2019-01-14 ENCOUNTER — Other Ambulatory Visit: Payer: Self-pay

## 2019-01-14 VITALS — BP 116/78 | HR 70 | Wt 208.3 lb

## 2019-01-14 DIAGNOSIS — G8929 Other chronic pain: Secondary | ICD-10-CM

## 2019-01-14 DIAGNOSIS — M5442 Lumbago with sciatica, left side: Secondary | ICD-10-CM

## 2019-01-14 DIAGNOSIS — R102 Pelvic and perineal pain: Secondary | ICD-10-CM

## 2019-01-14 DIAGNOSIS — M5441 Lumbago with sciatica, right side: Secondary | ICD-10-CM

## 2019-01-14 MED ORDER — AMITRIPTYLINE HCL 10 MG PO TABS: 10.0000 mg | ORAL_TABLET | Freq: Every day | ORAL | 0 refills | Status: DC

## 2019-01-14 NOTE — Patient Instructions (Signed)
Interstitial Cystitis  Interstitial cystitis is inflammation of the bladder. This may cause pain in the bladder area as well as a frequent and urgent need to urinate. The bladder is a hollow organ in the lower part of the abdomen. It stores urine after the urine is made in the kidneys. The severity of interstitial cystitis can vary from person to person. You may have flare-ups, and then your symptoms may go away for a while. For many people, it becomes a long-term (chronic) problem. What are the causes? The cause of this condition is not known. What increases the risk? The following factors may make you more likely to develop this condition:  You are female. You have fibromyalgia. Low Back Sprain or Strain Rehab Ask your health care provider which exercises are safe for you. Do exercises exactly as told by your health care provider and adjust them as directed. It is normal to feel mild stretching, pulling, tightness, or discomfort as you do these exercises. Stop right away if you feel sudden pain or your pain gets worse. Do not begin these exercises until told by your health care provider. Stretching and range-of-motion exercises These exercises warm up your muscles and joints and improve the movement and flexibility of your back. These exercises also help to relieve pain, numbness, and tingling. Lumbar rotation  Lie on your back on a firm surface and bend your knees. Straighten your arms out to your sides so each arm forms a 90-degree angle (right angle) with a side of your body. Slowly move (rotate) both of your knees to one side of your body until you feel a stretch in your lower back (lumbar). Try not to let your shoulders lift off the floor. Hold this position for __________ seconds. Tense your abdominal muscles and slowly move your knees back to the starting position. Repeat this exercise on the other side of your body. Repeat __________ times. Complete this exercise __________ times a  day. Single knee to chest  Lie on your back on a firm surface with both legs straight. Bend one of your knees. Use your hands to move your knee up toward your chest until you feel a gentle stretch in your lower back and buttock. Hold your leg in this position by holding on to the front of your knee. Keep your other leg as straight as possible. Hold this position for __________ seconds. Slowly return to the starting position. Repeat with your other leg. Repeat __________ times. Complete this exercise __________ times a day. Prone extension on elbows  Lie on your abdomen on a firm surface (prone position). Prop yourself up on your elbows. Use your arms to help lift your chest up until you feel a gentle stretch in your abdomen and your lower back. This will place some of your body weight on your elbows. If this is uncomfortable, try stacking pillows under your chest. Your hips should stay down, against the surface that you are lying on. Keep your hip and back muscles relaxed. Hold this position for __________ seconds. Slowly relax your upper body and return to the starting position. Repeat __________ times. Complete this exercise __________ times a day. Strengthening exercises These exercises build strength and endurance in your back. Endurance is the ability to use your muscles for a long time, even after they get tired. Pelvic tilt This exercise strengthens the muscles that lie deep in the abdomen. Lie on your back on a firm surface. Bend your knees and keep your feet flat on  the floor. Tense your abdominal muscles. Tip your pelvis up toward the ceiling and flatten your lower back into the floor. To help with this exercise, you may place a small towel under your lower back and try to push your back into the towel. Hold this position for __________ seconds. Let your muscles relax completely before you repeat this exercise. Repeat __________ times. Complete this exercise __________ times a  day. Alternating arm and leg raises  Get on your hands and knees on a firm surface. If you are on a hard floor, you may want to use padding, such as an exercise mat, to cushion your knees. Line up your arms and legs. Your hands should be directly below your shoulders, and your knees should be directly below your hips. Lift your left leg behind you. At the same time, raise your right arm and straighten it in front of you. Do not lift your leg higher than your hip. Do not lift your arm higher than your shoulder. Keep your abdominal and back muscles tight. Keep your hips facing the ground. Do not arch your back. Keep your balance carefully, and do not hold your breath. Hold this position for __________ seconds. Slowly return to the starting position. Repeat with your right leg and your left arm. Repeat __________ times. Complete this exercise __________ times a day. Abdominal set with straight leg raise  Lie on your back on a firm surface. Bend one of your knees and keep your other leg straight. Tense your abdominal muscles and lift your straight leg up, 4-6 inches (10-15 cm) off the ground. Keep your abdominal muscles tight and hold this position for __________ seconds. Do not hold your breath. Do not arch your back. Keep it flat against the ground. Keep your abdominal muscles tense as you slowly lower your leg back to the starting position. Repeat with your other leg. Repeat __________ times. Complete this exercise __________ times a day. Single leg lower with bent knees Lie on your back on a firm surface. Tense your abdominal muscles and lift your feet off the floor, one foot at a time, so your knees and hips are bent in 90-degree angles (right angles). Your knees should be over your hips and your lower legs should be parallel to the floor. Keeping your abdominal muscles tense and your knee bent, slowly lower one of your legs so your toe touches the ground. Lift your leg back up to  return to the starting position. Do not hold your breath. Do not let your back arch. Keep your back flat against the ground. Repeat with your other leg. Repeat __________ times. Complete this exercise __________ times a day. Posture and body mechanics Good posture and healthy body mechanics can help to relieve stress in your body's tissues and joints. Body mechanics refers to the movements and positions of your body while you do your daily activities. Posture is part of body mechanics. Good posture means: Your spine is in its natural S-curve position (neutral). Your shoulders are pulled back slightly. Your head is not tipped forward. Follow these guidelines to improve your posture and body mechanics in your everyday activities. Standing  When standing, keep your spine neutral and your feet about hip width apart. Keep a slight bend in your knees. Your ears, shoulders, and hips should line up. When you do a task in which you stand in one place for a long time, place one foot up on a stable object that is 2-4 inches (5-10 cm)  high, such as a footstool. This helps keep your spine neutral. Sitting  When sitting, keep your spine neutral and keep your feet flat on the floor. Use a footrest, if necessary, and keep your thighs parallel to the floor. Avoid rounding your shoulders, and avoid tilting your head forward. When working at a desk or a computer, keep your desk at a height where your hands are slightly lower than your elbows. Slide your chair under your desk so you are close enough to maintain good posture. When working at a computer, place your monitor at a height where you are looking straight ahead and you do not have to tilt your head forward or downward to look at the screen. Resting When lying down and resting, avoid positions that are most painful for you. If you have pain with activities such as sitting, bending, stooping, or squatting, lie in a position in which your body does not bend  very much. For example, avoid curling up on your side with your arms and knees near your chest (fetal position). If you have pain with activities such as standing for a long time or reaching with your arms, lie with your spine in a neutral position and bend your knees slightly. Try the following positions: Lying on your side with a pillow between your knees. Lying on your back with a pillow under your knees. Lifting  When lifting objects, keep your feet at least shoulder width apart and tighten your abdominal muscles. Bend your knees and hips and keep your spine neutral. It is important to lift using the strength of your legs, not your back. Do not lock your knees straight out. Always ask for help to lift heavy or awkward objects. This information is not intended to replace advice given to you by your health care provider. Make sure you discuss any questions you have with your health care provider. Document Released: 02/06/2005 Document Revised: 05/31/2018 Document Reviewed: 02/28/2018 Elsevier Patient Education  2020 [place] have irritable bowel syndrome (IBS).  You have endometriosis. This condition may be aggravated by:  Stress.  Smoking.  Spicy foods. What are the signs or symptoms? Symptoms of interstitial cystitis vary, and they can change over time. Symptoms may include:  Discomfort or pain in the bladder area, which is in the lower abdomen. Pain can range from mild to severe. The pain may change in intensity as the bladder fills with urine or as it empties.  Pain in the pelvic area, between the hip bones.  An urgent need to urinate.  Frequent urination.  Pain during urination.  Pain during sex.  Blood in the urine. For women, symptoms often get worse during menstruation. How is this diagnosed? This condition is diagnosed based on your symptoms, your medical history, and a physical exam. You may have tests to rule out other conditions, such as:  Urine  tests.  Cystoscopy. For this test, a tool similar to a very thin telescope is used to look into your bladder.  Biopsy. This involves taking a sample of tissue from the bladder to be examined under a microscope. How is this treated? There is no cure for this condition, but treatment can help you control your symptoms. Work closely with your health care provider to find the most effective treatments for you. Treatment options may include:  Medicines to relieve pain and reduce how often you feel the need to urinate.  Learning ways to control when you urinate (bladder training).  Lifestyle  changes, such as changing your diet or taking steps to control stress.  Using a device that provides electrical stimulation to your nerves, which can relieve pain (neuromodulation therapy). The device is placed on your back, where it blocks the nerves that cause you to feel pain in your bladder area.  A procedure that stretches your bladder by filling it with air or fluid.  Surgery. This is rare. It is only done for extreme cases, if other treatments do not help. Follow these instructions at home: Bladder training   Use bladder training techniques as directed. Techniques may include: ? Urinating at scheduled times. ? Training yourself to delay urination. ? Doing exercises (Kegel exercises) to strengthen the muscles that control urine flow.  Keep a bladder diary. ? Write down the times that you urinate and any symptoms that you have. This can help you find out which foods, liquids, or activities make your symptoms worse. ? Use your bladder diary to schedule bathroom trips. If you are away from home, plan to be near a bathroom at each of your scheduled times.  Make sure that you urinate just before you leave the house and just before you go to bed. Eating and drinking  Make dietary changes as recommended by your health care provider. You may need to avoid: ? Spicy foods. ? Foods that contain a lot of  potassium.  Limit your intake of beverages that make you need to urinate. These include: ? Caffeinated beverages like soda, coffee, and tea. ? Alcohol. General instructions  Take over-the-counter and prescription medicines only as told by your health care provider.  Do not drink alcohol.  You can try a warm or cool compress over your bladder for comfort.  Avoid wearing tight clothing.  Do not use any products that contain nicotine or tobacco, such as cigarettes and e-cigarettes. If you need help quitting, ask your health care provider.  Keep all follow-up visits as told by your health care provider. This is important. Contact a health care provider if you have:  Symptoms that do not get better with treatment.  Pain or discomfort that gets worse.  More frequent urges to urinate.  A fever. Get help right away if:  You have no control over when you urinate. Summary  Interstitial cystitis is inflammation of the bladder.  This condition may cause pain in the bladder area as well as a frequent and urgent need to urinate.  You may have flare-ups of the condition, and then it may go away for a while. For many people, it becomes a long-term (chronic) problem.  There is no cure for interstitial cystitis, but treatment methods are available to control your symptoms. This information is not intended to replace advice given to you by your health care provider. Make sure you discuss any questions you have with your health care provider. Document Released: 10/08/2003 Document Revised: 01/19/2017 Document Reviewed: 01/01/2017 Elsevier Patient Education  2020 [employer].

## 2019-01-14 NOTE — Progress Notes (Signed)
GYNECOLOGY OFFICE VISIT NOTE  History:   Michele Trevino is a 43 y.o. [phone] (3 c-sections and 1 vaginal birth) here today for back and pelvic pain. Her and her partner have not had sex for 3 months and reports trying the other day and being in pain since that time. Reports regular monthly periods without abnormality. Not currently birth control; previous BTL. Reports pain all around sides radiating to pelvic area and lateral sides of back. Denies any urinary symptoms, hematuria. Has been on medications for PID and UTI for about past 3 months. No recent STD; maybe 20 years ago. GC/Chlamy and wet prep negative in October. Treated for UTI in October 2020. Reports some pain prior to intercourse but worsening since intercourse. Denies nausea, vomiting, diarrhea, constipation. She denies any abnormal vaginal discharge, bleeding or other concerns.    Past Medical History:  Diagnosis Date  . Arthritis   . Carpal tunnel syndrome     Past Surgical History:  Procedure Laterality Date  . CESAREAN SECTION      The following portions of the patient's history were reviewed and updated as appropriate: allergies, current medications, past family history, past medical history, past social history, past surgical history and problem list.   Health Maintenance: NILM pap with positive HPV on May 2019.  Normal mammogram on 11/20/2017.   Review of Systems:  Pertinent items noted in HPI and remainder of comprehensive ROS otherwise negative.  Physical Exam:  BP 116/78   Pulse 70   Wt 208 lb 4.8 oz (94.5 kg)   LMP 12/31/2018 (Approximate)   BMI 39.36 kg/m  CONSTITUTIONAL: Well-developed, well-nourished female in no acute distress.  HEENT:  Normocephalic, atraumatic. External right and left ear normal. No scleral icterus.  NECK: Normal range of motion, supple, no masses noted on observation SKIN: No rash noted. Not diaphoretic. No erythema. No pallor. MUSCULOSKELETAL: Normal range of motion. No  edema noted. BACK: Bilateral lumbosacral TTP. No CVA tenderness. NEUROLOGIC: Alert and oriented to person, place, and time. Normal muscle tone coordination. No cranial nerve deficit noted. PSYCHIATRIC: Normal mood and affect. Normal behavior. Normal judgment and thought content. CARDIOVASCULAR: Normal heart rate noted RESPIRATORY: Effort and breath sounds normal, no problems with respiration noted ABDOMEN: No masses noted. No other overt distention noted.   PELVIC: Normal appearing external genitalia; normal appearing vaginal mucosa and cervix.  No abnormal discharge noted.  Normal uterine size, no other palpable masses, no uterine or adnexal tenderness. Pelvic floor tender to palpation most noted on left posterior vaginal wall. Normal tissue without evidence of dryness/atrophy. No cervical motion tenderness.   Labs and Imaging No results found for this or any previous visit (from the past 168 hour(s)). No results found.    Assessment and Plan:  Michele Trevino was seen today for pelvic pain.  Diagnoses and all orders for this visit:  Chronic bilateral low back pain with sciatica -     Ordered: amitriptyline (ELAVIL) 10 MG tablet; Take 1 tablet (10 mg total) by mouth at bedtime. - Unclear etiology. Likely multifactorial including lumbosacral strain, interstitial cystitis and pelvic floor pain.  - Elavil started which should help with back pain, interstitial cystitis and mood as well. No SI. Patient never started Lexapro as prescribed and this was discontinued today as starting Elavil. Can increase Elavil by patient response. Patient to f/u with Roselyn Reef for behavioral health symptoms. - Advised exercise, stretching, conservative management for back pain.  - Unlikely PID as patient with no recent STD and no  cervical motion tenderness despite multiple treatments for PID recently.   Suprapubic pain Pelvic Floor Pain -     Ambulatory referral to Physical Therapy - Referred to PT for pelvic floor physical  therapy - As noted above, patient with pelvic floor pain on palpation - Already completed PT for back pain    Routine preventative health maintenance measures emphasized. Please refer to After Visit Summary for other counseling recommendations.   No follow-ups on file.    Total face-to-face time with patient: 25 minutes.  Over 50% of encounter was spent on counseling and coordination of care.  Barrington Ellison, MD [facility] Family Medicine Fellow, [facility] for [employer], [place]

## 2019-01-15 ENCOUNTER — Ambulatory Visit: Payer: Self-pay | Attending: Family Medicine | Admitting: Physical Therapy

## 2019-01-15 ENCOUNTER — Encounter: Payer: Self-pay | Admitting: Physical Therapy

## 2019-01-15 DIAGNOSIS — R252 Cramp and spasm: Secondary | ICD-10-CM | POA: Insufficient documentation

## 2019-01-15 DIAGNOSIS — R278 Other lack of coordination: Secondary | ICD-10-CM | POA: Insufficient documentation

## 2019-01-15 DIAGNOSIS — M545 Low back pain, unspecified: Secondary | ICD-10-CM

## 2019-01-15 DIAGNOSIS — G8929 Other chronic pain: Secondary | ICD-10-CM | POA: Insufficient documentation

## 2019-01-15 NOTE — Patient Instructions (Signed)
Moisturizers . They are used in the vagina to hydrate the mucous membrane that make up the vaginal canal. . Designed to keep a more normal acid balance (ph) . Once placed in the vagina, it will last between two to three days.  . Use 2-3 times per week at bedtime  . Ingredients to avoid is glycerin and fragrance, can increase chance of infection . Should not be used just before sex due to causing irritation . Most are gels administered either in a tampon-shaped applicator or as a vaginal suppository. They are non-hormonal.   Types of Moisturizers  . Vitamin E vaginal suppositories- Whole foods, Amazon . Moist Again . Coconut oil- can break down condoms . Julva- (Do no use if on Tamoxifen) amazon . Yes moisturizer- amazon . NeuEve Silk , NeuEve Silver for menopausal or over 65 (if have severe vaginal atrophy or cancer treatments use NeuEve Silk for  1 month than move to NeuEve Silver)- Amazon, Neuve.com . Olive and Bee intimate cream- www.oliveandbee.com.au . Mae vaginal moisturizer- Amazon . Aloe   Creams to use externally on the Vulva area  Desert Harvest Releveum (good for for cancer patients that had radiation to the area)- amazon or www.desertharvest.com  V-magic cream - amazon  Julva-amazon  Vital "V Wild Yam salve ( help moisturize and help with thinning vulvar area, does have Beeswax  MoodMaid Botanical Pro-Meno Wild Yam Cream- Amazon  Desert Harvest Gele  Cleo by Damiva labial moisturizer (Amazon,   Coconut or olive oil  aloe   Things to avoid in the vaginal area . Do not use things to irritate the vulvar area . No lotions just specialized creams for the vulva area- Neogyn, V-magic, No soaps; can use Aveeno or Calendula cleanser if needed. Must be gentle . No deodorants . No douches . Good to sleep without underwear to let the vaginal area to air out . No scrubbing: spread the lips to let warm water rinse over labias and pat dry  

## 2019-01-15 NOTE — Therapy (Signed)
[facility] [address] [place], [place], [address] Phone: [phone]   Fax:  [phone]  Physical Therapy Evaluation  Patient Details  Name: Michele Trevino MRN: [id number] Date of Birth: 07-15-1975 Referring Provider (PT): Dr. Barrington Ellison   Encounter Date: 01/15/2019  PT End of Session - 01/15/19 1224    Visit Number  1    Date for PT Re-Evaluation  04/09/19    Authorization Type  no insurance    PT Start Time  [id number]    PT Stop Time  1223    PT Time Calculation (min)  38 min    Activity Tolerance  Patient tolerated treatment well;Patient limited by pain    Behavior During Therapy  [facility] [place] for tasks assessed/performed       Past Medical History:  Diagnosis Date  . Arthritis   . Carpal tunnel syndrome     Past Surgical History:  Procedure Laterality Date  . CESAREAN SECTION      There were no vitals filed for this visit.   Subjective Assessment - 01/15/19 1149    Subjective  Patient reports she is having low back and pelvic pain. Patient has had this pain since 2018 when she got married. Patient started to have sex with her husband and started the pain. Patient would get many vaginal infections. Patient had PT this year for back pain but did not help.    Patient Stated Goals  alleviate the pain    Currently in Pain?  Yes    Pain Score  9     Pain Location  Back    Pain Orientation  Lower    Pain Descriptors / Indicators  Dull;Sharp    Pain Type  Chronic pain    Pain Onset  More than a month ago    Pain Frequency  Constant    Aggravating Factors   sitting and standing too long, bending forward    Pain Relieving Factors  take pain pills    Multiple Pain Sites  Yes    Pain Score  8    Pain Location  Pelvis    Pain Orientation  Lower    Pain Descriptors / Indicators  Dull;Sharp    Pain Type  Chronic pain    Pain Onset  1 to 4 weeks ago    Pain Frequency  Constant    Aggravating Factors    sitting and standing too long    Pain Relieving Factors  pain medication         [facility] PT Assessment - 01/15/19 0001      Assessment   Medical Diagnosis  R10.2 Suprapubic pain    Referring Provider (PT)  Dr. Barrington Ellison    Onset Date/Surgical Date  --   2018   Prior Therapy  yes for back this year      Precautions   Precautions  None      Restrictions   Weight Bearing Restrictions  No      Balance Screen   Has the patient fallen in the past 6 months  No    Has the patient had a decrease in activity level because of a fear of falling?   No    Is the patient reluctant to leave their home because of a fear of falling?   No      Home Film/video editor residence      Prior Function   Level of Independence  Independent      Cognition   Overall Cognitive Status  Within Functional Limits for tasks assessed      ROM / Strength   AROM / PROM / Strength  AROM;PROM;Strength      AROM   Lumbar Flexion  decreased by 50%   pain   Lumbar Extension  decreased by 25%   pain   Lumbar - Right Side Bend  decreased by 25%   pain   Lumbar - Left Side Bend  decreased by 25%   pain   Lumbar - Right Rotation  decreased by 25%   pain   Lumbar - Left Rotation  decreased by 25%   pain     Strength   Right Hip Flexion  4/5    Right Hip Extension  4/5    Right Hip ABduction  3+/5    Right Hip ADduction  3+/5    Left Hip Flexion  4/5    Left Hip Extension  4/5    Left Hip ABduction  3+/5    Left Hip ADduction  3+/5      Palpation   SI assessment   right ilium is rotated anteriorly    Palpation comment  tenderness on C-section, abdomen, gluteals, lumbar                Objective measurements completed on examination: See above findings.    Pelvic Floor Special Questions - 01/15/19 0001    Currently Sexually Active  Yes    Is this Painful  Yes   feels like razor blades   Marinoff Scale  pain prevents any attempts at intercourse    Urinary  Leakage  Yes    Pad use  0   stays in house due to urinary leakage   Activities that cause leaking  With strong urge;Laughing;Sneezing    Urinary urgency  Yes    Fecal incontinence  --   constipation, strain, 2x/week   External Perineal Exam  --    Skin Integrity  Intact   very dry   Perineal Body/Introitus   Elevated    External Palpation  tenderness located on the bulbocavernosus    Pelvic Floor Internal Exam  Patient confirms identification and approves PT to assess pelvic floor and treatment    Exam Type  Vaginal    Palpation  unable to place therapist index finger into the vaginal canal past 1 inch due to pain; tenderness throughout the pelvic floor    Strength  weak squeeze, no lift       [facility] Adult PT Treatment/Exercise - 01/15/19 0001      Self-Care   Self-Care  Other Self-Care Comments    Other Self-Care Comments   verbal instruction on vaginal dryness and using mositurizers to reduce the dryness and irritation             PT Education - 01/15/19 1223    Education Details  educated patient on vaginal dryness, gave patient samples    Person(s) Educated  Patient    Methods  Explanation;Demonstration;Handout    Comprehension  Verbalized understanding;Returned demonstration       PT Short Term Goals - 01/15/19 1246      PT SHORT TERM GOAL #1   Title  independent with initial HEP    Time  4    Period  Weeks    Status  New    Target Date  02/12/19      PT SHORT TERM GOAL #2  Title  understand ways to control pain with theroy on pain and meditation    Time  4    Period  Weeks    Status  New    Target Date  02/12/19      PT SHORT TERM GOAL #3   Title  understand self perineal massage to relax the muscles and prepare for internal work    Time  4    Period  Weeks    Status  New    Target Date  02/12/19      PT SHORT TERM GOAL #4   Title  demonstrate correct body mechanics with daily tasks to reduce strain on low back    Time  4    Period  Weeks     Status  New    Target Date  02/12/19        PT Long Term Goals - 01/15/19 1249      PT LONG TERM GOAL #1   Title  Pt will be I and compliant with HEP    Baseline  --    Period  Weeks    Status  New    Target Date  04/09/19      PT LONG TERM GOAL #2   Title  Marinoff score </=1/3 due to improve elongation of the pelvic floor muscles and relaxation breathing    Baseline  --    Time  12    Period  Weeks    Status  New    Target Date  04/09/19      PT LONG TERM GOAL #3   Title  sit for 30 minutes for a car ride or TV show with minimal pain due to improved tissue mobility    Baseline  --    Time  12    Period  Weeks    Status  New    Target Date  04/09/19      PT LONG TERM GOAL #4   Title  able to have a bowel movement without straining due to ablility to relax the pelvic floor and understanding correct body mechanics    Time  12    Period  Weeks    Status  New    Target Date  04/09/19      PT LONG TERM GOAL #5   Title  walk with pain minimal due to improved lumbar ROM and bilateral hip strength >/= 4/5    Baseline  ---    Period  Weeks    Status  New    Target Date  04/09/19             Plan - 01/15/19 1224    Clinical Impression Statement  Patient is a 43 year old female with back and pelvic pain since 2018 when she got married and had sex with her husband for the first time. Patient reports lumbar and pelvic pain at level 9/10. Pain is worse with sitting, standing, movement and activity. Patient reports pain medication helps with the intensity. Patient has limited lumbar ROM. Patient has weakness in bilateral hips and abdomen. Patient right ilium is rotated anteriorly. Patient has tenderness located in lumbar, bilateral gluteal, abdominals, ischicavernosus, bulbocavernosus, perineal body and internally all of the pelvic floor is tender. Patient was not able to tolerate the therapist from placing her finger into the vaginal canal for 1 inch. Pelvic floor strength is  2/5. Patient has vaginal dryness. Patient reports difficulty with constipation and has to strain to have  a bowel movement. Patient will leak urine with urge to void, coughing and sneezing. Patient does not wear a pad. Patient does not have intercourse due to pain, Marinoff score is 3/3. Patient does not leave her house due to urinary leakage and pain. Patient has a history of UTI's. Patient will benefit from skilled therapy to reduce her pain and restore function.    Personal Factors and Comorbidities  Age;Fitness;Comorbidity 1;Time since onset of injury/illness/exacerbation;Sex    Comorbidities  c-scetion 3x    Examination-Activity Limitations  Toileting;Continence;Stairs;Stand;Lift;Sit;Sleep;Squat;Bed Mobility;Locomotion Level;Bend;Caring for Others    Examination-Participation Restrictions  Community Activity;Interpersonal Relationship;Laundry;Cleaning;Shop    Stability/Clinical Decision Making  Evolving/Moderate complexity    Clinical Decision Making  Moderate    Rehab Potential  Excellent    PT Frequency  2x / week    PT Duration  12 weeks    PT Treatment/Interventions  ADLs/Self Care Home Management;Biofeedback;Cryotherapy;Electrical Stimulation;Ultrasound;Moist Heat;Traction;Iontophoresis 4mg /ml Dexamethasone;Gait training;Functional mobility training;Therapeutic activities;Therapeutic exercise;Neuromuscular re-education;Patient/family education;Dry needling;Passive range of motion;Scar mobilization;Manual techniques;Taping;Spinal Manipulations;Joint Manipulations    PT Next Visit Plan  abdominal massage; toileting; diaphragmatic breathing, meditation, stretches, review vaginal care    Consulted and Agree with Plan of Care  Patient       Patient will benefit from skilled therapeutic intervention in order to improve the following deficits and impairments:  Decreased coordination, Decreased range of motion, Increased fascial restricitons, Difficulty walking, Increased muscle spasms, Pain,  Decreased strength, Decreased mobility, Impaired flexibility  Visit Diagnosis: Chronic bilateral low back pain without sciatica - Plan: PT plan of care cert/re-cert  Other lack of coordination - Plan: PT plan of care cert/re-cert  Cramp and spasm - Plan: PT plan of care cert/re-cert     Problem List Patient Active Problem List   Diagnosis Date Noted  . Constipation 08/14/2018  . Fatigue 08/14/2018  . Insomnia 08/14/2018  . Vaginal discharge 08/14/2018  . Hospital discharge follow-up 08/14/2018  . 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 OBESITY, NOS 04/19/2006  . BLEEDING, RECTAL 04/19/2006    Michele Trevino, PT 01/15/19 1:51 PM    [facility] [address] [place], [place], [address] Phone: [phone]   Fax:  [phone]  Name: Michele Trevino MRN: [id number] Date of Birth: 13-Oct-1975

## 2019-01-20 ENCOUNTER — Ambulatory Visit: Payer: Self-pay | Admitting: Physical Therapy

## 2019-01-20 ENCOUNTER — Telehealth: Payer: Self-pay | Admitting: Physical Therapy

## 2019-01-20 NOTE — Telephone Encounter (Signed)
Called patient about her 11:45 appointment she missed. Left a message.  Earlie Counts, PT @11 /30/2020@ 12:13 PM

## 2019-01-20 NOTE — BH Specialist Note (Signed)
Integrated Behavioral Health via Telemedicine Video Visit  01/20/2019 Michele Trevino [id number]  Number of [place] visits: 1 Session Start time: 8:41  Session End time: 9:47 Total time: 6  Referring Provider: Barrington Ellison, MD Type of Visit: Video Patient/Family location: Home [name] Provider location: [facility] All persons participating in visit: [name] and [place]    Confirmed patient's address: Yes  Confirmed patient's phone number: Yes  Any changes to demographics: No   Confirmed patient's insurance: Yes  Any changes to patient's insurance: No   Discussed confidentiality: Yes   I connected with Michele Trevino by a video enabled telemedicine application and verified that I am speaking with the correct person using two identifiers.     I discussed the limitations of evaluation and management by telemedicine and the availability of in person appointments.  I discussed that the purpose of this visit is to provide behavioral health care while limiting exposure to the novel coronavirus.   Discussed there is a possibility of technology failure and discussed alternative modes of communication if that failure occurs.  I discussed that engaging in this video visit, they consent to the provision of behavioral healthcare and the services will be billed under their insurance.  Patient and/or legal guardian expressed understanding and consented to video visit: Pt cut visit short as her friend got in her car  PRESENTING CONCERNS: Patient and/or family reports the following symptoms/concerns: Pt affirms she did have a positive depression screen at her previous visit, and requests to cut visit short and reschedule as her friend got in the car.  Duration of problem: Unknown; Severity of problem: moderately severe  STRENGTHS (Protective Factors/Coping Skills): -  GOALS ADDRESSED: Did not address goals, as visit was cut  short.    ASSESSMENT: Patient currently experiencing Undetermined.   Patient may benefit from psychoeducation and brief therapeutic interventions regarding coping with symptoms of depression and anxiety at follow up visit.  Marland Kitchen  PLAN: 1. Follow up with behavioral health clinician on : One week 2. Referral(s): [place] (In Clinic)  I discussed the assessment and treatment plan with the patient and/or parent/guardian. They were provided an opportunity to ask questions and all were answered. They agreed with the plan and demonstrated an understanding of the instructions.   They were advised to call back or seek an in-person evaluation if the symptoms worsen or if the condition fails to improve as anticipated.  [name] [facility]  Depression screen [facility] 2/9 01/14/2019 11/28/2018 11/14/2018 08/14/2018 04/16/2018  Decreased Interest 2 3 3  0 3  Down, Depressed, Hopeless 2 2 2  0 3  PHQ - 2 Score 4 5 5  0 6  Altered sleeping 2 1 1  - 3  Tired, decreased energy 2 3 3  - 3  Change in appetite 1 1 0 - 3  Feeling bad or failure about yourself  2 1 3  - -  Trouble concentrating 2 2 2  - 0  Moving slowly or fidgety/restless 1 1 2  - 0  Suicidal thoughts 1 1 2  - 1  PHQ-9 Score 15 15 18  - 16   GAD 7 : Generalized Anxiety Score 01/14/2019 11/28/2018 11/14/2018 08/14/2018  Nervous, Anxious, on Edge 2 3 3 3   Control/stop worrying 2 1 2 3   Worry too much - different things 0 1 2 3   Trouble relaxing 2 1 2 3   Restless 2 1 2 3   Easily annoyed or irritable 2 1 2 3   Afraid -  awful might happen 2 1 2 3   Total GAD 7 Score 12 9 15 21   Anxiety Difficulty - - Somewhat difficult -

## 2019-01-22 ENCOUNTER — Ambulatory Visit: Payer: No Typology Code available for payment source | Attending: Family Medicine | Admitting: Physical Therapy

## 2019-01-22 ENCOUNTER — Encounter: Payer: Self-pay | Admitting: Physical Therapy

## 2019-01-22 ENCOUNTER — Other Ambulatory Visit: Payer: Self-pay

## 2019-01-22 ENCOUNTER — Ambulatory Visit: Payer: Self-pay | Admitting: Clinical

## 2019-01-22 DIAGNOSIS — M62830 Muscle spasm of back: Secondary | ICD-10-CM | POA: Insufficient documentation

## 2019-01-22 DIAGNOSIS — Z1331 Encounter for screening for depression: Secondary | ICD-10-CM

## 2019-01-22 DIAGNOSIS — R252 Cramp and spasm: Secondary | ICD-10-CM | POA: Insufficient documentation

## 2019-01-22 DIAGNOSIS — R278 Other lack of coordination: Secondary | ICD-10-CM | POA: Insufficient documentation

## 2019-01-22 DIAGNOSIS — M545 Low back pain: Secondary | ICD-10-CM | POA: Insufficient documentation

## 2019-01-22 DIAGNOSIS — G8929 Other chronic pain: Secondary | ICD-10-CM

## 2019-01-22 NOTE — Therapy (Signed)
[facility] [address] [place], [place], [address] Phone: [phone]   Fax:  [phone]  Physical Therapy Treatment  Patient Details  Name: Michele Trevino MRN: [id number] Date of Birth: 01/20/1976 Referring Provider (PT): Dr. Barrington Ellison   Encounter Date: 01/22/2019  PT End of Session - 01/22/19 1148    Visit Number  2    Date for PT Re-Evaluation  04/09/19    Authorization Type  no insurance    PT Start Time  0354    PT Stop Time  1225    PT Time Calculation (min)  40 min    Activity Tolerance  Patient tolerated treatment well;Patient limited by pain    Behavior During Therapy  [facility] for tasks assessed/performed       Past Medical History:  Diagnosis Date  . Arthritis   . Carpal tunnel syndrome     Past Surgical History:  Procedure Laterality Date  . CESAREAN SECTION      There were no vitals filed for this visit.  Subjective Assessment - 01/22/19 1149    Subjective  I felt okay after the initial evaluation. I felt hopeful with getting better.    Patient Stated Goals  alleviate the pain    Currently in Pain?  Yes    Pain Score  5     Pain Location  Back    Pain Orientation  Lower    Pain Descriptors / Indicators  Dull;Sharp    Pain Type  Chronic pain    Pain Onset  More than a month ago    Pain Frequency  Constant    Aggravating Factors   sitting and standing too long, bending forward    Pain Relieving Factors  take pain pills    Multiple Pain Sites  Yes    Pain Score  1    Pain Location  Pelvis    Pain Orientation  Lower    Pain Descriptors / Indicators  Dull    Pain Type  Chronic pain    Pain Onset  1 to 4 weeks ago    Pain Frequency  Constant    Aggravating Factors   sitting and standing too long    Pain Relieving Factors  pain medicaiton                       [facility] Adult PT Treatment/Exercise - 01/22/19 0001      Self-Care   Self-Care  Other Self-Care  Comments    Other Self-Care Comments   instruction on you tube pelvic floor meditation to relax the pelvic floor and manage pain      Therapeutic Activites    Therapeutic Activities  Other Therapeutic Activities    Other Therapeutic Activities  instruction on correct toileting with diaphragmatic breathing, relaxing the pelvic floor and breathing correctly       Modalities   Modalities  Moist Heat      Moist Heat Therapy   Number Minutes Moist Heat  20 Minutes    Moist Heat Location  Lumbar Spine   while performing abdominal soft tissue work     Manual Therapy   Manual Therapy  Soft tissue mobilization    Manual therapy comments  instruction on abdomial massage and c-section scar massage and patient returned demonstration    Soft tissue mobilization  circular massage to promote peristalic motion of intestines, I Love You massage, scar massage on c-section scar  PT Education - 01/22/19 1224    Education Details  pelvic floor meditation; instruction on c-section scar massage, abdominal massage; toileting technique    Person(s) Educated  Patient    Methods  Explanation;Demonstration;Verbal cues;Handout    Comprehension  Returned demonstration;Verbalized understanding       PT Short Term Goals - 01/15/19 1246      PT SHORT TERM GOAL #1   Title  independent with initial HEP    Time  4    Period  Weeks    Status  New    Target Date  02/12/19      PT SHORT TERM GOAL #2   Title  understand ways to control pain with theroy on pain and meditation    Time  4    Period  Weeks    Status  New    Target Date  02/12/19      PT SHORT TERM GOAL #3   Title  understand self perineal massage to relax the muscles and prepare for internal work    Time  4    Period  Weeks    Status  New    Target Date  02/12/19      PT SHORT TERM GOAL #4   Title  demonstrate correct body mechanics with daily tasks to reduce strain on low back    Time  4    Period  Weeks    Status  New     Target Date  02/12/19        PT Long Term Goals - 01/15/19 1249      PT LONG TERM GOAL #1   Title  Pt will be I and compliant with HEP    Baseline  --    Period  Weeks    Status  New    Target Date  04/09/19      PT LONG TERM GOAL #2   Title  Marinoff score </=1/3 due to improve elongation of the pelvic floor muscles and relaxation breathing    Baseline  --    Time  12    Period  Weeks    Status  New    Target Date  04/09/19      PT LONG TERM GOAL #3   Title  sit for 30 minutes for a car ride or TV show with minimal pain due to improved tissue mobility    Baseline  --    Time  12    Period  Weeks    Status  New    Target Date  04/09/19      PT LONG TERM GOAL #4   Title  able to have a bowel movement without straining due to ablility to relax the pelvic floor and understanding correct body mechanics    Time  12    Period  Weeks    Status  New    Target Date  04/09/19      PT LONG TERM GOAL #5   Title  walk with pain minimal due to improved lumbar ROM and bilateral hip strength >/= 4/5    Baseline  ---    Period  Weeks    Status  New    Target Date  04/09/19            Plan - 01/22/19 1225    Clinical Impression Statement  Patient just started therapy and has not met goals yet. Patient had increased softness of the abdomen and c-section scar. Patient is able  to demonstrate correct diaphragmatic breathing in sitting for toileting. Patient was educated on pelvic floor meditation to reduce pelvic floor pain. Patient will benefit from skilled Pt to reduce her pain and restore function.    Personal Factors and Comorbidities  Age;Fitness;Comorbidity 1;Time since onset of injury/illness/exacerbation;Sex    Comorbidities  c-scetion 3x    Examination-Activity Limitations  Toileting;Continence;Stairs;Stand;Lift;Sit;Sleep;Squat;Bed Mobility;Locomotion Level;Bend;Caring for Others    Examination-Participation Restrictions  Community Activity;Interpersonal  Relationship;Laundry;Cleaning;Shop    Stability/Clinical Decision Making  Evolving/Moderate complexity    Rehab Potential  Excellent    PT Frequency  2x / week    PT Duration  12 weeks    PT Treatment/Interventions  ADLs/Self Care Home Management;Biofeedback;Cryotherapy;Electrical Stimulation;Ultrasound;Moist Heat;Traction;Iontophoresis 31m/ml Dexamethasone;Gait training;Functional mobility training;Therapeutic activities;Therapeutic exercise;Neuromuscular re-education;Patient/family education;Dry needling;Passive range of motion;Scar mobilization;Manual techniques;Taping;Spinal Manipulations;Joint Manipulations    PT Next Visit Plan  back dry needling then pulling tissue forward to reduce pain; scar massage, hip stretches    Recommended Other Services  MD signed initial eval    Consulted and Agree with Plan of Care  Patient       Patient will benefit from skilled therapeutic intervention in order to improve the following deficits and impairments:  Decreased coordination, Decreased range of motion, Increased fascial restricitons, Difficulty walking, Increased muscle spasms, Pain, Decreased strength, Decreased mobility, Impaired flexibility  Visit Diagnosis: Chronic bilateral low back pain without sciatica  Other lack of coordination  Cramp and spasm  Muscle spasm of back     Problem List Patient Active Problem List   Diagnosis Date Noted  . Constipation 08/14/2018  . Fatigue 08/14/2018  . Insomnia 08/14/2018  . Vaginal discharge 08/14/2018  . Hospital discharge follow-up 08/14/2018  . 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 OBESITY, NOS 04/19/2006  . BLEEDING, RECTAL 04/19/2006    CEarlie Counts PT 01/22/19 12:29 PM   [facility] [address] [place] [address]: [phone]  Fax:  [phone] Name: JKAYLINA CAHUEMRN: [id number] of Birth: [birth year]

## 2019-01-22 NOTE — Patient Instructions (Signed)
About Abdominal Massage  Abdominal massage, also called external colon massage, is a self-treatment circular massage technique that can reduce and eliminate gas and ease constipation. The colon naturally contracts in waves in a clockwise direction starting from inside the right hip, moving up toward the ribs, across the belly, and down inside the left hip.  When you perform circular abdominal massage, you help stimulate your colon's normal wave pattern of movement called peristalsis.  It is most beneficial when done after eating.  Positioning You can practice abdominal massage with oil while lying down, or in the shower with soap.  Some people find that it is just as effective to do the massage through clothing while sitting or standing.  How to Massage Start by placing your finger tips or knuckles on your right side, just inside your hip bone.  . Make small circular movements while you move upward toward your rib cage.   . Once you reach the bottom right side of your rib cage, take your circular movements across to the left side of the bottom of your rib cage.  . Next, move downward until you reach the inside of your left hip bone.  This is the path your feces travel in your colon. . Continue to perform your abdominal massage in this pattern for 10 minutes each day.     You can apply as much pressure as is comfortable in your massage.  Start gently and build pressure as you continue to practice.  Notice any areas of pain as you massage; areas of slight pain may be relieved as you massage, but if you have areas of significant or intense pain, consult with your healthcare provider.  Other Considerations . General physical activity including bending and stretching can have a beneficial massage-like effect on the colon.  Deep breathing can also stimulate the colon because breathing deeply activates the same nervous system that supplies the colon.   . Abdominal massage should always be used in  combination with a bowel-conscious diet that is high in the proper type of fiber for you, fluids (primarily water), and a regular exercise program.  Toileting Techniques for Bowel Movements (Defecation) Using your belly (abdomen) and pelvic floor muscles to have a bowel movement is usually instinctive.  Sometimes people can have problems with these muscles and have to relearn proper defecation (emptying) techniques.  If you have weakness in your muscles, organs that are falling out, decreased sensation in your pelvis, or ignore your urge to go, you may find yourself straining to have a bowel movement.  You are straining if you are: . holding your breath or taking in a huge gulp of air and holding it  . keeping your lips and jaw tensed and closed tightly . turning red in the face because of excessive pushing or forcing . developing or worsening your  hemorrhoids . getting faint while pushing . not emptying completely and have to defecate many times a day  If you are straining, you are actually making it harder for yourself to have a bowel movement.  Many people find they are pulling up with the pelvic floor muscles and closing off instead of opening the anus. Due to lack pelvic floor relaxation and coordination the abdominal muscles, one has to work harder to push the feces out.  Many people have never been taught how to defecate efficiently and effectively.  Notice what happens to your body when you are having a bowel movement.  While you are sitting on  the toilet pay attention to the following areas: . Jaw and mouth position . Angle of your hips   . Whether your feet touch the ground or not . Arm placement  . Spine position . Waist . Belly tension . Anus (opening of the anal canal)  An Evacuation/Defecation Plan   Here are the 4 basic points:  1. Lean forward enough for your elbows to rest on your knees 2. Support your feet on the floor or use a low stool if your feet don't touch the  floor  3. Push out your belly as if you have swallowed a beach ball-you should feel a widening of your waist 4. Open and relax your pelvic floor muscles, rather than tightening around the anus       The following conditions my require modifications to your toileting posture:  . If you have had surgery in the past that limits your back, hip, pelvic, knee or ankle flexibility . Constipation   Your healthcare practitioner may make the following additional suggestions and adjustments:  1) Sit on the toilet  a) Make sure your feet are supported. b) Notice your hip angle and spine position-most people find it effective to lean forward or raise their knees, which can help the muscles around the anus to relax  c) When you lean forward, place your forearms on your thighs for support  2) Relax suggestions a) Breath deeply in through your nose and out slowly through your mouth as if you are smelling the flowers and blowing out the candles. b) To become aware of how to relax your muscles, contracting and releasing muscles can be helpful.  Pull your pelvic floor muscles in tightly by using the image of holding back gas, or closing around the anus (visualize making a circle smaller) and lifting the anus up and in.  Then release the muscles and your anus should drop down and feel open. Repeat 5 times ending with the feeling of relaxation. c) Keep your pelvic floor muscles relaxed; let your belly bulge out. d) The digestive tract starts at the mouth and ends at the anal opening, so be sure to relax both ends of the tube.  Place your tongue on the roof of your mouth with your teeth separated.  This helps relax your mouth and will help to relax the anus at the same time.  3) Empty (defecation) a) Keep your pelvic floor and sphincter relaxed, then bulge your anal muscles.  Make the anal opening wide.  b) Stick your belly out as if you have swallowed a beach ball. c) Make your belly wall hard using your  belly muscles while continuing to breathe. Doing this makes it easier to open your anus. d) Breath out and give a grunt (or try using other sounds such as ahhhh, shhhhh, ohhhh or grrrrrrr).  4) Finish a) As you finish your bowel movement, pull the pelvic floor muscles up and in.  This will leave your anus in the proper place rather than remaining pushed out and down. If you leave your anus pushed out and down, it will start to feel as though that is normal and give you incorrect signals about needing to have a bowel movement.  You tube videos  Guided Meditation for Pelvic Floor Relaxation  [employer]   [facility] [address] [place], [place] [address] Phone # [phone] Fax [phone]

## 2019-01-24 ENCOUNTER — Ambulatory Visit (INDEPENDENT_AMBULATORY_CARE_PROVIDER_SITE_OTHER): Payer: Self-pay | Admitting: Urology

## 2019-01-24 ENCOUNTER — Other Ambulatory Visit: Payer: Self-pay

## 2019-01-24 DIAGNOSIS — N3946 Mixed incontinence: Secondary | ICD-10-CM

## 2019-01-24 DIAGNOSIS — Z87440 Personal history of urinary (tract) infections: Secondary | ICD-10-CM

## 2019-01-24 DIAGNOSIS — N761 Subacute and chronic vaginitis: Secondary | ICD-10-CM

## 2019-01-24 DIAGNOSIS — R3914 Feeling of incomplete bladder emptying: Secondary | ICD-10-CM

## 2019-01-28 ENCOUNTER — Encounter: Payer: Self-pay | Admitting: Physical Therapy

## 2019-01-28 ENCOUNTER — Ambulatory Visit: Payer: Self-pay | Admitting: Physical Therapy

## 2019-01-28 ENCOUNTER — Other Ambulatory Visit: Payer: Self-pay

## 2019-01-28 DIAGNOSIS — M62830 Muscle spasm of back: Secondary | ICD-10-CM

## 2019-01-28 DIAGNOSIS — M545 Low back pain: Secondary | ICD-10-CM

## 2019-01-28 DIAGNOSIS — R252 Cramp and spasm: Secondary | ICD-10-CM

## 2019-01-28 DIAGNOSIS — G8929 Other chronic pain: Secondary | ICD-10-CM

## 2019-01-28 DIAGNOSIS — R278 Other lack of coordination: Secondary | ICD-10-CM

## 2019-01-28 NOTE — Therapy (Signed)
[facility] [address] [place], [place], [address] Phone: [phone]   Fax:  [phone]  Physical Therapy Treatment  Patient Details  Name: Michele Trevino MRN: [id number] Date of Birth: 1975-10-27 Referring Provider (PT): Dr. Barrington Ellison   Encounter Date: 01/28/2019  PT End of Session - 01/28/19 0937    Visit Number  3    Date for PT Re-Evaluation  04/09/19    Authorization Type  no insurance    PT Start Time  0930    PT Stop Time  1010    PT Time Calculation (min)  40 min    Activity Tolerance  Patient tolerated treatment well;Patient limited by pain    Behavior During Therapy  [facility] for tasks assessed/performed       Past Medical History:  Diagnosis Date  . Arthritis   . Carpal tunnel syndrome     Past Surgical History:  Procedure Laterality Date  . CESAREAN SECTION      There were no vitals filed for this visit.  Subjective Assessment - 01/28/19 0940    Subjective  I felt good after last visit.    Patient Stated Goals  alleviate the pain    Currently in Pain?  Yes    Pain Score  6     Pain Location  Back    Pain Orientation  Lower    Pain Descriptors / Indicators  Dull;Sharp    Pain Type  Chronic pain    Pain Onset  More than a month ago    Pain Frequency  Constant    Aggravating Factors   sitting and standing too long, bending forward    Pain Relieving Factors  take pain pills    Multiple Pain Sites  Yes    Pain Score  2    Pain Location  Pelvis    Pain Orientation  Lower    Pain Descriptors / Indicators  Dull    Pain Type  Chronic pain    Pain Onset  1 to 4 weeks ago    Pain Frequency  Constant    Aggravating Factors   sitting and standing too long    Pain Relieving Factors  pain medication                       [facility] Adult PT Treatment/Exercise - 01/28/19 0001      Lumbar Exercises: Stretches   Lower Trunk Rotation  2 reps;30 seconds   right and left    Hip Flexor Stretch  Right;Left;1 rep;30 seconds    Hip Flexor Stretch Limitations  1/2 kneel with one arm raised     Piriformis Stretch  Right;Left;1 rep;30 seconds   sitting     Lumbar Exercises: Aerobic   Recumbent Bike  level 1 5 minutes while assessing the patient      Knee/Hip Exercises: Sidelying   Other Sidelying Knee/Hip Exercises  while releasing the left lower abdominal with hip rotation, extension and adduction movement      Manual Therapy   Manual Therapy  Soft tissue mobilization;Myofascial release    Soft tissue mobilization  lumbar paraspinals, along the lateral abdominal wall and C-sections scar    Myofascial Release  using the suction cup to pull the fascial off the muscle to improve movement and reduce pain; using the Iastim technique to release the lumbar, lateral abdominal wall tissue, quadruped pulling the tissue from the back to the abdomen to release the  tissue, picking up the lower abdominal tissue in quadruped with patient rocking in quadruped and diagonals, release of the pelvic floor with one finger anterior to the coccyx and superior transverse in quadruped with rocking motion and diagonals       Trigger Point Dry Needling - 01/28/19 0001    Consent Given?  Yes    Education Handout Provided  Yes    Muscles Treated Back/Hip  Lumbar multifidi    Lumbar multifidi Response  Twitch response elicited;Palpable increased muscle length           PT Education - 01/28/19 1009    Education Details  Access Code: [email or web address]    Person(s) Educated  Patient    Methods  Explanation;Demonstration;Verbal cues;Handout    Comprehension  Verbalized understanding;Returned demonstration       PT Short Term Goals - 01/28/19 1019      PT SHORT TERM GOAL #1   Title  independent with initial HEP    Time  4    Period  Weeks    Status  On-going    Target Date  02/12/19      PT SHORT TERM GOAL #2   Title  understand ways to control pain with theroy on pain and meditation     Time  4    Period  Weeks    Status  On-going        PT Long Term Goals - 01/15/19 1249      PT LONG TERM GOAL #1   Title  Pt will be I and compliant with HEP    Baseline  --    Period  Weeks    Status  New    Target Date  04/09/19      PT LONG TERM GOAL #2   Title  Marinoff score </=1/3 due to improve elongation of the pelvic floor muscles and relaxation breathing    Baseline  --    Time  12    Period  Weeks    Status  New    Target Date  04/09/19      PT LONG TERM GOAL #3   Title  sit for 30 minutes for a car ride or TV show with minimal pain due to improved tissue mobility    Baseline  --    Time  12    Period  Weeks    Status  New    Target Date  04/09/19      PT LONG TERM GOAL #4   Title  able to have a bowel movement without straining due to ablility to relax the pelvic floor and understanding correct body mechanics    Time  12    Period  Weeks    Status  New    Target Date  04/09/19      PT LONG TERM GOAL #5   Title  walk with pain minimal due to improved lumbar ROM and bilateral hip strength >/= 4/5    Baseline  ---    Period  Weeks    Status  New    Target Date  04/09/19            Plan - 01/28/19 0936    Clinical Impression Statement  Patient has significant tightness in the lumbar paraspinals that they bent the dry needle. Patient had increased soft tissue mobility after manual work to improve tissue elongation and blood flow. Patient continues to have restrictions around the c-section scar. Patient was able to do cardio  today. Patient will benefit from skilled therapy to reduce pain, improve elongation of tissue and restor function.    Personal Factors and Comorbidities  Age;Fitness;Comorbidity 1;Time since onset of injury/illness/exacerbation;Sex    Comorbidities  c-scetion 3x    Examination-Activity Limitations  Toileting;Continence;Stairs;Stand;Lift;Sit;Sleep;Squat;Bed Mobility;Locomotion Level;Bend;Caring for Others    Examination-Participation  Restrictions  Community Activity;Interpersonal Relationship;Laundry;Cleaning;Shop    Stability/Clinical Decision Making  Evolving/Moderate complexity    Rehab Potential  Excellent    PT Frequency  2x / week    PT Duration  12 weeks    PT Treatment/Interventions  ADLs/Self Care Home Management;Biofeedback;Cryotherapy;Electrical Stimulation;Ultrasound;Moist Heat;Traction;Iontophoresis 4mg /ml Dexamethasone;Gait training;Functional mobility training;Therapeutic activities;Therapeutic exercise;Neuromuscular re-education;Patient/family education;Dry needling;Passive range of motion;Scar mobilization;Manual techniques;Taping;Spinal Manipulations;Joint Manipulations    PT Next Visit Plan  scar massage, hip stretches; internal massage; meditation for pain    PT Home Exercise Plan  Access Code: [email or web address]    Consulted and Agree with Plan of Care  Patient       Patient will benefit from skilled therapeutic intervention in order to improve the following deficits and impairments:  Decreased coordination, Decreased range of motion, Increased fascial restricitons, Difficulty walking, Increased muscle spasms, Pain, Decreased strength, Decreased mobility, Impaired flexibility  Visit Diagnosis: Chronic bilateral low back pain without sciatica  Other lack of coordination  Cramp and spasm  Muscle spasm of back     Problem List Patient Active Problem List   Diagnosis Date Noted  . Constipation 08/14/2018  . Fatigue 08/14/2018  . Insomnia 08/14/2018  . Vaginal discharge 08/14/2018  . Hospital discharge follow-up 08/14/2018  . 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 OBESITY, NOS 04/19/2006  . BLEEDING, RECTAL 04/19/2006    Earlie Counts, PT 01/28/19 10:21 AM   [facility] [address] [place], [place], [address] Phone: [phone]   Fax:  [phone]  Name: ILEANA VIGLIOTTI MRN: [id number] Date of Birth: 11/19/1975

## 2019-01-28 NOTE — Patient Instructions (Signed)
Access Code: [email or web address]  URL: [email or web address]  Date: 01/28/2019  Prepared by: Earlie Counts   Exercises Seated Piriformis Stretch with Trunk Bend - 2 reps - 1 sets - 30 sec hold - 1x daily - 7x weekly Half Kneeling Hip Flexor Stretch with Sidebend - 1 reps - 1 sets - 30 sec hold - 1x daily - 7x weekly Sidelying Thoracic and Shoulder Rotation - 2 reps - 1 sets - 30 sec hold - 1x daily - 7x weekly Patient Education Trigger [facility] Needling [facility] [address] [place], [place] [address] Phone # [phone] Fax [phone]

## 2019-01-30 ENCOUNTER — Encounter: Payer: Self-pay | Admitting: Physical Therapy

## 2019-01-30 ENCOUNTER — Ambulatory Visit: Payer: Self-pay | Admitting: Physical Therapy

## 2019-01-30 ENCOUNTER — Other Ambulatory Visit: Payer: Self-pay

## 2019-01-30 DIAGNOSIS — M62830 Muscle spasm of back: Secondary | ICD-10-CM

## 2019-01-30 DIAGNOSIS — G8929 Other chronic pain: Secondary | ICD-10-CM

## 2019-01-30 DIAGNOSIS — R278 Other lack of coordination: Secondary | ICD-10-CM

## 2019-01-30 DIAGNOSIS — R252 Cramp and spasm: Secondary | ICD-10-CM

## 2019-01-30 NOTE — Therapy (Signed)
[facility] [address], [place], [place], [address] Phone: [phone]   Fax:  [phone]  Physical Therapy Treatment  Patient Details  Name: Michele Trevino MRN: [id number] Date of Birth: 1976-02-09 Referring Provider (PT): Dr. Barrington Ellison   Encounter Date: 01/30/2019  PT End of Session - 01/30/19 0926    Visit Number  4    Date for PT Re-Evaluation  04/09/19    Authorization Type  no insurance    PT Start Time  0845    PT Stop Time  0925    PT Time Calculation (min)  40 min    Activity Tolerance  Patient tolerated treatment well;No increased pain    Behavior During Therapy  WFL for tasks assessed/performed       Past Medical History:  Diagnosis Date  . Arthritis   . Carpal tunnel syndrome     Past Surgical History:  Procedure Laterality Date  . CESAREAN SECTION      There were no vitals filed for this visit.  Subjective Assessment - 01/30/19 0848    Subjective  I felt good after therapy. It took till the next day. I woke up with a muscle spasm in my upper back.    Patient Stated Goals  alleviate the pain    Currently in Pain?  Yes    Pain Score  3     Pain Location  Back    Pain Orientation  Upper    Pain Descriptors / Indicators  Spasm    Pain Type  Chronic pain    Pain Onset  More than a month ago    Pain Frequency  Constant    Aggravating Factors   sitting and standing too long, bending forward    Pain Relieving Factors  take pain pills    Multiple Pain Sites  No                       [facility] Adult PT Treatment/Exercise - 01/30/19 0001      Lumbar Exercises: Stretches   Quadruped Mid Back Stretch  1 rep;30 seconds    Other Lumbar Stretch Exercise  sitting hip adductor stretch holding for 30 seconds    Other Lumbar Stretch Exercise  downward dog; thread the needle stretch bil.       Lumbar Exercises: Aerobic   Recumbent Bike  level 3 5 minutes while assessing the  patient      Lumbar Exercises: Quadruped   Other Quadruped Lumbar Exercises  quadruped and rotate trunk with lift arm for rotation each side      Shoulder Exercises: Supine   Horizontal ABduction  Strengthening;Both;10 reps    Horizontal ABduction Limitations  yellow band      Manual Therapy   Manual Therapy  Soft tissue mobilization;Joint mobilization    Joint Mobilization  posterior rib mobilization; P-A and rotational mobilization to T3-L5    Soft tissue mobilization  using assistive device to lift the fascial off the muscle and elongate the thoracic lumbar paraspinals       Trigger Point Dry Needling - 01/30/19 0001    Consent Given?  Yes    Education Handout Provided  Previously provided    Dry Needling Comments  thoracic multfidi   trigger point response and elnogation of tissue          PT Education - 01/30/19 [id number]    Education Details  Access Code: [email or web address]    Person(s)  Educated  Patient    Methods  Explanation;Demonstration;Verbal cues;Handout    Comprehension  Returned demonstration;Verbalized understanding       PT Short Term Goals - 01/28/19 1019      PT SHORT TERM GOAL #1   Title  independent with initial HEP    Time  4    Period  Weeks    Status  On-going    Target Date  02/12/19      PT SHORT TERM GOAL #2   Title  understand ways to control pain with theroy on pain and meditation    Time  4    Period  Weeks    Status  On-going        PT Long Term Goals - 01/15/19 1249      PT LONG TERM GOAL #1   Title  Pt will be I and compliant with HEP    Baseline  --    Period  Weeks    Status  New    Target Date  04/09/19      PT LONG TERM GOAL #2   Title  Marinoff score </=1/3 due to improve elongation of the pelvic floor muscles and relaxation breathing    Baseline  --    Time  12    Period  Weeks    Status  New    Target Date  04/09/19      PT LONG TERM GOAL #3   Title  sit for 30 minutes for a car ride or TV show with minimal pain due to  improved tissue mobility    Baseline  --    Time  12    Period  Weeks    Status  New    Target Date  04/09/19      PT LONG TERM GOAL #4   Title  able to have a bowel movement without straining due to ablility to relax the pelvic floor and understanding correct body mechanics    Time  12    Period  Weeks    Status  New    Target Date  04/09/19      PT LONG TERM GOAL #5   Title  walk with pain minimal due to improved lumbar ROM and bilateral hip strength >/= 4/5    Baseline  ---    Period  Weeks    Status  New    Target Date  04/09/19            Plan - 01/30/19 0912    Clinical Impression Statement  Patient is not limited by pain during exercises. We were not able to do internal work due to being on her cycle. Patient is starting to move better due to the reduction in muscle spasms and has elongation of tissue after therapy. Patient is doing her exercises at home. She is learning how to strengthen her core. Patient will benefit from skilled therapy to reduce pain, improve elongation of tissue and restore function.    Personal Factors and Comorbidities  Age;Fitness;Comorbidity 1;Time since onset of injury/illness/exacerbation;Sex    Comorbidities  c-scetion 3x    Examination-Activity Limitations  Toileting;Continence;Stairs;Stand;Lift;Sit;Sleep;Squat;Bed Mobility;Locomotion Level;Bend;Caring for Others    Examination-Participation Restrictions  Community Activity;Interpersonal Relationship;Laundry;Cleaning;Shop    Stability/Clinical Decision Making  Evolving/Moderate complexity    Rehab Potential  Excellent    PT Frequency  2x / week    PT Duration  12 weeks    PT Treatment/Interventions  ADLs/Self Care Home Management;Biofeedback;Cryotherapy;Electrical Stimulation;Ultrasound;Moist Heat;Traction;Iontophoresis 4mg /ml Dexamethasone;Gait  training;Functional mobility training;Therapeutic activities;Therapeutic exercise;Neuromuscular re-education;Patient/family education;Dry  needling;Passive range of motion;Scar mobilization;Manual techniques;Taping;Spinal Manipulations;Joint Manipulations    PT Next Visit Plan  scar massage, internal massage; meditation for pain, body mechanics with daily tasks, perineal massage    PT Home Exercise Plan  Access Code: [email or web address]    Consulted and Agree with Plan of Care  Patient       Patient will benefit from skilled therapeutic intervention in order to improve the following deficits and impairments:  Decreased coordination, Decreased range of motion, Increased fascial restricitons, Difficulty walking, Increased muscle spasms, Pain, Decreased strength, Decreased mobility, Impaired flexibility  Visit Diagnosis: Chronic bilateral low back pain without sciatica  Other lack of coordination  Cramp and spasm  Muscle spasm of back     Problem List Patient Active Problem List   Diagnosis Date Noted  . Constipation 08/14/2018  . Fatigue 08/14/2018  . Insomnia 08/14/2018  . Vaginal discharge 08/14/2018  . Hospital discharge follow-up 08/14/2018  . 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 OBESITY, NOS 04/19/2006  . BLEEDING, RECTAL 04/19/2006    Earlie Counts, PT 01/30/19 9:30 AM   [facility] [address], [place], [place], [address] Phone: [phone]   Fax:  [phone]  Name: Michele Trevino MRN: [id number] Date of Birth: 11-02-1975

## 2019-01-30 NOTE — BH Specialist Note (Signed)
Integrated Behavioral Health via Telemedicine Telephone Visit  01/30/2019 Michele Trevino [id number]  Number of [place] visits: 1 (first "visit" was 6 minutes/cut short) Session Start time: 8:22  Session End time: 9:11 Total time: 29   Referring Provider: Barrington Ellison, MD Type of Visit: Telephone Patient/Family location: Home [name] Provider location: [place] All persons participating in visit: [name] and [name]    Confirmed patient's address: Yes  Confirmed patient's phone number: Yes  Any changes to demographics: No   Confirmed patient's insurance: Yes  Any changes to patient's insurance: No   Discussed confidentiality: Yes   I connected with Michele Trevino  by a video enabled telemedicine application and verified that I am speaking with the correct person using two identifiers.     I discussed the limitations of evaluation and management by telemedicine and the availability of in person appointments.  I discussed that the purpose of this visit is to provide behavioral health care while limiting exposure to the novel coronavirus.   Discussed there is a possibility of technology failure and discussed alternative modes of communication if that failure occurs.  I discussed that engaging in this video visit, they consent to the provision of behavioral healthcare and the services will be billed under their insurance.  Patient and/or legal guardian expressed understanding and consented to telephone visit: Yes   PRESENTING CONCERNS: Patient and/or family reports the following symptoms/concerns: Pt states her primary symptoms of depression and anxiety are attributed to physical pain and stress of current pandemic; pt's goal is to increase her overall health and wellbeing, and prefers self-coping strategies.  Duration of problem: Increase in current pandemic; Severity of problem: moderately severe  STRENGTHS  (Protective Factors/Coping Skills): Supportive husband; open to implement self-coping strategies; self-awareness  GOALS ADDRESSED: Patient will: 1.  Reduce symptoms of: anxiety, depression and stress  2.  Increase knowledge and/or ability of: healthy habits and stress reduction  3.  Demonstrate ability to: Increase healthy adjustment to current life circumstances and Increase motivation to adhere to plan of care  INTERVENTIONS: Interventions utilized:  Mindfulness or Relaxation Training, Medication Monitoring and Psychoeducation and/or Health Education Standardized Assessments completed: not needed today; will assess further at upcoming visit  ASSESSMENT: Patient currently experiencing Adjustment disorder with mixed anxiety and depressed mood.   Patient may benefit from psychoeducation and brief therapeutic interventions regarding coping with symptoms of anxiety and depression .  PLAN: 1. Follow up with behavioral health clinician on : Two weeks 2. Behavioral recommendations:  -Begin taking Elavil as prescribed -CALM relaxation breathing exercise twice daily (morning; at bedtime) -Continue to prioritize healthy sleep  -Continue one 4oz glass of wine at bedtime only -Consider using Stop, Breathe, and Think app as needed throughout the day (and child version w son, for improved family symptoms of anxiety) 3. Referral(s): [facility] (In Clinic)  I discussed the assessment and treatment plan with the patient and/or parent/guardian. They were provided an opportunity to ask questions and all were answered. They agreed with the plan and demonstrated an understanding of the instructions.   They were advised to call back or seek an in-person evaluation if the symptoms worsen or if the condition fails to improve as anticipated.  [name] [facility]  Depression screen [facility] 2/9 01/14/2019 11/28/2018 11/14/2018 08/14/2018 04/16/2018  Decreased Interest 2 3 3  0 3  Down,  Depressed, Hopeless 2 2 2  0 3  PHQ - 2 Score 4 5 5  0 6  Altered sleeping 2 1 1  - 3  Tired, decreased energy 2 3 3  - 3  Change in appetite 1 1 0 - 3  Feeling bad or failure about yourself  2 1 3  - -  Trouble concentrating 2 2 2  - 0  Moving slowly or fidgety/restless 1 1 2  - 0  Suicidal thoughts 1 1 2  - 1  PHQ-9 Score 15 15 18  - 16   GAD 7 : Generalized Anxiety Score 01/14/2019 11/28/2018 11/14/2018 08/14/2018  Nervous, Anxious, on Edge 2 3 3 3   Control/stop worrying 2 1 2 3   Worry too much - different things 0 1 2 3   Trouble relaxing 2 1 2 3   Restless 2 1 2 3   Easily annoyed or irritable 2 1 2 3   Afraid - awful might happen 2 1 2 3   Total GAD 7 Score 12 9 15 21   Anxiety Difficulty - - Somewhat difficult -

## 2019-01-30 NOTE — Patient Instructions (Signed)
Access Code: [email or web address]  URL: [email or web address]  Date: 01/30/2019  Prepared by: Earlie Counts   Exercises Seated Piriformis Stretch with Trunk Bend - 2 reps - 1 sets - 30 sec hold - 1x daily - 7x weekly Half Kneeling Hip Flexor Stretch with Sidebend - 1 reps - 1 sets - 30 sec hold - 1x daily - 7x weekly Sidelying Thoracic and Shoulder Rotation - 2 reps - 1 sets - 30 sec hold - 1x daily - 7x weekly V Sit Hip Adductor Hamstring Stretch - 2 reps - 1 sets - 30 sec hold - 1x daily - 7x weekly Downward Dog - 3 reps - 1 sets - 5 sec hold - 1x daily - 7x weekly Child's Pose with Thread the Needle - 2 reps - 1 sets - 15 sec hold                            - 1x daily - 7x weekly Quadruped Full Range Thoracic Rotation with Reach - 2 reps - 1 sets - 15 sec hold - 1x daily - 7x weekly Child's Pose Stretch - 2 reps - 1 sets - 30 sec hold - 1x daily - 7x weekly Supine Shoulder Horizontal Abduction with Resistance - 10 reps - 1 sets - 1x daily - 7x weekly Patient Education [facility] Dry Needling [facility] [address] [place], [place] [address] Phone # [phone] Fax [phone]

## 2019-02-04 ENCOUNTER — Encounter: Payer: Self-pay | Admitting: Physical Therapy

## 2019-02-04 ENCOUNTER — Ambulatory Visit (INDEPENDENT_AMBULATORY_CARE_PROVIDER_SITE_OTHER): Payer: Self-pay | Admitting: Clinical

## 2019-02-04 DIAGNOSIS — F4323 Adjustment disorder with mixed anxiety and depressed mood: Secondary | ICD-10-CM

## 2019-02-04 NOTE — Patient Instructions (Signed)

## 2019-02-05 ENCOUNTER — Encounter: Payer: Self-pay | Admitting: Physical Therapy

## 2019-02-05 ENCOUNTER — Other Ambulatory Visit: Payer: Self-pay

## 2019-02-05 ENCOUNTER — Ambulatory Visit: Payer: Self-pay | Admitting: Physical Therapy

## 2019-02-05 DIAGNOSIS — G8929 Other chronic pain: Secondary | ICD-10-CM

## 2019-02-05 DIAGNOSIS — R252 Cramp and spasm: Secondary | ICD-10-CM

## 2019-02-05 DIAGNOSIS — M62830 Muscle spasm of back: Secondary | ICD-10-CM

## 2019-02-05 DIAGNOSIS — M545 Low back pain, unspecified: Secondary | ICD-10-CM

## 2019-02-05 DIAGNOSIS — R278 Other lack of coordination: Secondary | ICD-10-CM

## 2019-02-05 NOTE — Patient Instructions (Addendum)
[email or web address]  Hook-Lying    Lie with hips and knees bent. Allow body's muscles to relax. Place hands on belly. Inhale slowly and deeply for _3__ seconds, so hands move up. Then take _3__ seconds to exhale. Repeat _5__ times. Do _3__ times a day. While doing this bulge pelvic floor  Copyright  VHI. All rights reserved.  Log Roll    Lying on back, bend left knee and place left arm across chest. Roll all in one movement to the right. Reverse to roll to the left. Always move as one unit.  Breath out while doing this.  Copyright  VHI. All rights reserved.  Getting Into / Out of Bed    Lower self to lie down on one side by raising legs and lowering head at the same time. Use arms to assist moving without twisting. Bend both knees to roll onto back if desired. To sit up, start from lying on side, and use same move-ments in reverse. Keep trunk aligned with legs. Make sure you are breathing out.   Copyright  VHI. All rights reserved.  Stand to Sit / Sit to Stand    To sit: Bend knees to lower self onto front edge of chair, then scoot back on seat. To stand: Reverse sequence by placing one foot forward, and scoot to front of seat. Use rocking motion to stand up.  Copyright  VHI. All rights reserved.  Avoid Twisting    Avoid twisting or bending back. Pivot around using foot movements, and bend at knees if needed when reaching for articles.   Copyright  VHI. All rights reserved.  Lifting Principles  .Maintain proper posture and head alignment. .Slide object as close as possible before lifting. .Move obstacles out of the way. .Test before lifting; ask for help if too heavy. .Tighten stomach muscles without holding breath. .Use smooth movements; do not jerk. .Use legs to do the work, and pivot with feet. .Distribute the work load symmetrically and close to the center of trunk. .Push instead of pull whenever possible.  Copyright  VHI. All rights reserved.  Sleeping on  Side    Place pillow between knees. Use cervical support under neck and a roll around waist as needed. Put pillow between feet  Copyright  VHI. All rights reserved.  [name] [address] [name], [name] [address] Phone # [phone] Fax [phone]

## 2019-02-05 NOTE — Therapy (Signed)
[facility] [address] [place], [place], [address] Phone: [phone]   Fax:  [phone]  Physical Therapy Treatment  Patient Details  Name: Michele Trevino MRN: [id number] Date of Birth: 28-Apr-1975 Referring Provider (PT): Dr. Barrington Ellison   Encounter Date: 02/05/2019  PT End of Session - 02/05/19 1611    Visit Number  5    Date for PT Re-Evaluation  04/09/19    Authorization Type  no insurance    PT Start Time  [id number]    PT Stop Time  1610    PT Time Calculation (min)  40 min    Activity Tolerance  Patient tolerated treatment well;No increased pain    Behavior During Therapy  WFL for tasks assessed/performed       Past Medical History:  Diagnosis Date  . Arthritis   . Carpal tunnel syndrome     Past Surgical History:  Procedure Laterality Date  . CESAREAN SECTION      There were no vitals filed for this visit.  Subjective Assessment - 02/05/19 1523    Subjective  My back feels okay.    Patient Stated Goals  alleviate the pain    Currently in Pain?  Yes    Pain Score  6     Pain Location  Back    Pain Orientation  Upper;Lower    Pain Descriptors / Indicators  Spasm    Pain Type  Chronic pain    Pain Onset  More than a month ago    Pain Frequency  Constant    Aggravating Factors   sitting and standing too long, bending forward    Pain Relieving Factors  take pain pills    Multiple Pain Sites  Yes    Pain Score  10    Pain Location  Pelvis    Pain Orientation  Mid    Pain Descriptors / Indicators  Throbbing    Pain Type  Chronic pain    Pain Onset  1 to 4 weeks ago    Pain Frequency  Intermittent    Aggravating Factors   with penile penetration    Pain Relieving Factors  pain medication                       [facility] Adult PT Treatment/Exercise - 02/05/19 0001      Self-Care   Self-Care  Other Self-Care Comments    Other Self-Care Comments   using desert harvest  relelvum to work on muscle pain from intercourse and gave patient samples      Therapeutic Activites    Therapeutic Activities  Other Therapeutic Activities    Other Therapeutic Activities  education on correct body mechanics with rolling in bed, lifting, getting out of bed and a chair, not twoisting to get items, lifting items,       Neuro Re-ed    Neuro Re-ed Details   diaphragmatic breathing and bulging of the pelvic floor      Lumbar Exercises: Stretches   Active Hamstring Stretch  Right;Left;60 seconds    Active Hamstring Stretch Limitations  foam roll    ITB Stretch  Right;Left;60 seconds   foam roll   ITB Stretch Limitations  foam roll    Piriformis Stretch  Right;Left;60 seconds   foam roll   Other Lumbar Stretch Exercise  sitting hip adductor stretch holding for 30 seconds      Manual Therapy   Manual Therapy  Soft tissue mobilization    Soft tissue mobilization  using the prickly roller on the inner thigh and quads to relax the pelvic floor and thigh muscles             PT Education - 02/05/19 1610    Education Details  education on correct body mechanics with daily tasks, bulging the pelvic floor to relax, diaphragmatic breathing    Person(s) Educated  Patient    Methods  Explanation;Demonstration;Handout    Comprehension  Verbalized understanding;Returned demonstration       PT Short Term Goals - 01/28/19 1019      PT SHORT TERM GOAL #1   Title  independent with initial HEP    Time  4    Period  Weeks    Status  On-going    Target Date  02/12/19      PT SHORT TERM GOAL #2   Title  understand ways to control pain with theroy on pain and meditation    Time  4    Period  Weeks    Status  On-going        PT Long Term Goals - 01/15/19 1249      PT LONG TERM GOAL #1   Title  Pt will be I and compliant with HEP    Baseline  --    Period  Weeks    Status  New    Target Date  04/09/19      PT LONG TERM GOAL #2   Title  Marinoff score </=1/3 due to  improve elongation of the pelvic floor muscles and relaxation breathing    Baseline  --    Time  12    Period  Weeks    Status  New    Target Date  04/09/19      PT LONG TERM GOAL #3   Title  sit for 30 minutes for a car ride or TV show with minimal pain due to improved tissue mobility    Baseline  --    Time  12    Period  Weeks    Status  New    Target Date  04/09/19      PT LONG TERM GOAL #4   Title  able to have a bowel movement without straining due to ablility to relax the pelvic floor and understanding correct body mechanics    Time  12    Period  Weeks    Status  New    Target Date  04/09/19      PT LONG TERM GOAL #5   Title  walk with pain minimal due to improved lumbar ROM and bilateral hip strength >/= 4/5    Baseline  ---    Period  Weeks    Status  New    Target Date  04/09/19            Plan - 02/05/19 1542    Clinical Impression Statement  Patient has learned correct body mechanics to reduce strain on lumbar. Patient had intercourse with her husband and it was a 10/10 pain. Patient has learned ways to relax her muscles when they are in pain.. Patient was not able to have internal soft tissue work due to having intercourse yesterday and in too much pain. Patient is working with a Engineer, water and it is helping her with life. Patient will benefit from skilled therapy to reduce pain, improve elongation of tissue and restore function.    Personal Factors and  Comorbidities  Age;Fitness;Comorbidity 1;Time since onset of injury/illness/exacerbation;Sex    Comorbidities  c-scetion 3x    Examination-Activity Limitations  Toileting;Continence;Stairs;Stand;Lift;Sit;Sleep;Squat;Bed Mobility;Locomotion Level;Bend;Caring for Others    Examination-Participation Restrictions  Community Activity;Interpersonal Relationship;Laundry;Cleaning;Shop    Stability/Clinical Decision Making  Evolving/Moderate complexity    Rehab Potential  Excellent    PT Frequency  2x / week    PT  Duration  12 weeks    PT Treatment/Interventions  ADLs/Self Care Home Management;Biofeedback;Cryotherapy;Electrical Stimulation;Ultrasound;Moist Heat;Traction;Iontophoresis 4mg /ml Dexamethasone;Gait training;Functional mobility training;Therapeutic activities;Therapeutic exercise;Neuromuscular re-education;Patient/family education;Dry needling;Passive range of motion;Scar mobilization;Manual techniques;Taping;Spinal Manipulations;Joint Manipulations    PT Next Visit Plan  scar massage, internal massage;  perineal massage    PT Home Exercise Plan  Access Code: [email or web address]    Consulted and Agree with Plan of Care  Patient       Patient will benefit from skilled therapeutic intervention in order to improve the following deficits and impairments:  Decreased coordination, Decreased range of motion, Increased fascial restricitons, Difficulty walking, Increased muscle spasms, Pain, Decreased strength, Decreased mobility, Impaired flexibility  Visit Diagnosis: Chronic bilateral low back pain without sciatica  Other lack of coordination  Cramp and spasm  Muscle spasm of back     Problem List Patient Active Problem List   Diagnosis Date Noted  . Constipation 08/14/2018  . Fatigue 08/14/2018  . Insomnia 08/14/2018  . Vaginal discharge 08/14/2018  . Hospital discharge follow-up 08/14/2018  . 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 OBESITY, NOS 04/19/2006  . BLEEDING, RECTAL 04/19/2006    Earlie Counts, PT 02/05/19 4:16 PM   [facility] [address] [place], [place], [address] Phone: [phone]   Fax:  [phone]  Name: Michele Trevino MRN: [id number] Date of Birth: 10/16/1975

## 2019-02-06 ENCOUNTER — Encounter: Payer: Self-pay | Admitting: Physical Therapy

## 2019-02-06 ENCOUNTER — Encounter

## 2019-02-06 NOTE — BH Specialist Note (Signed)
Integrated Behavioral Health via Telemedicine Video Visit  02/06/2019 [name] [id number]  Number of [place] visits: 2 Session Start time: 8:20  Session End time: 9:02 Total time: 54  Referring Provider: Barrington Ellison, MD Type of Visit: Video Patient/Family location: Home [facility] Provider location: [facility] All persons participating in visit: [name] and Michele Trevino    Confirmed patient's address: Yes  Confirmed patient's phone number: Yes  Any changes to demographics: No   Confirmed patient's insurance: Yes  Any changes to patient's insurance: No   Discussed confidentiality: At previous visit  I connected with [name] by a video enabled telemedicine application and verified that I am speaking with the correct person using two identifiers.     I discussed the limitations of evaluation and management by telemedicine and the availability of in person appointments.  I discussed that the purpose of this visit is to provide behavioral health care while limiting exposure to the novel coronavirus.   Discussed there is a possibility of technology failure and discussed alternative modes of communication if that failure occurs.  I discussed that engaging in this video visit, they consent to the provision of behavioral healthcare and the services will be billed under their insurance.  Patient and/or legal guardian expressed understanding and consented to video visit: Yes   PRESENTING CONCERNS: Patient and/or family reports the following symptoms/concerns: Pt states her primary symptoms are anhedonia(including no interest in drinking 4oz wine before bed this week), depression, oversleeping, fatigue, low self-esteem, lack of concentration, anxiety, excessive worry, difficulty relaxing, irritability, dread, and continuous back pain. Pt has not started taking Elavil yet, as she fears side effects. Passive SI with no plan,  no intent, and no current SI. Pt had one SI attempt at about 43yo, when she "took a bunch of pills".  Duration of problem: Increase in current pandemic; Severity of problem: severe  STRENGTHS (Protective Factors/Coping Skills): Supportive husband, self-awareness, self-coping strategies  GOALS ADDRESSED: Patient will: 1.  Reduce symptoms of: anxiety and depression  2.  Increase knowledge and/or ability of: healthy habits  3.  Demonstrate ability to: Improve medication compliance  INTERVENTIONS: Interventions utilized:  Behavioral Activation, Medication Monitoring and Psychoeducation and/or Health Education Standardized Assessments completed: C-SSRS Short, GAD-7 and PHQ 9  ASSESSMENT: Patient currently experiencing Major depressive disorder, recurrent, severe, without psychotic features.   Patient may benefit from continued psychoeducation and brief therapeutic interventions regarding coping with symptoms of depression and anxiety .  PLAN: 1. Follow up with behavioral health clinician on : One week 2. Behavioral recommendations:  -Start taking Elavil, as prescribed, starting today -Read MyChart message with crisis numbers and instructions to use if SI returns -Consider posting note to self in a location nearest to bed, to see upon waking: Reason to get out of bed today 3. Referral(s): [place] (In Clinic)  I discussed the assessment and treatment plan with the patient and/or parent/guardian. They were provided an opportunity to ask questions and all were answered. They agreed with the plan and demonstrated an understanding of the instructions.   They were advised to call back or seek an in-person evaluation if the symptoms worsen or if the condition fails to improve as anticipated.  [name] [facility]  Depression screen [facility] 2/9 02/18/2019 01/14/2019 11/28/2018 11/14/2018 08/14/2018  Decreased Interest 3 2 3 3  0  Down, Depressed, Hopeless 3 2 2 2  0  PHQ - 2  Score 6 4 5 5  0  Altered  sleeping 3 2 1 1  -  Tired, decreased energy 3 2 3 3  -  Change in appetite 0 1 1 0 -  Feeling bad or failure about yourself  3 2 1 3  -  Trouble concentrating 3 2 2 2  -  Moving slowly or fidgety/restless 1 1 1 2  -  Suicidal thoughts 1 1 1 2  -  PHQ-9 Score 20 15 15 18  -   GAD 7 : Generalized Anxiety Score 02/18/2019 01/14/2019 11/28/2018 11/14/2018  Nervous, Anxious, on Edge 3 2 3 3   Control/stop worrying 3 2 1 2   Worry too much - different things 3 0 1 2  Trouble relaxing 3 2 1 2   Restless 2 2 1 2   Easily annoyed or irritable 3 2 1 2   Afraid - awful might happen 3 2 1 2   Total GAD 7 Score 20 12 9 15   Anxiety Difficulty - - - Somewhat difficult

## 2019-02-10 ENCOUNTER — Other Ambulatory Visit: Payer: Self-pay

## 2019-02-11 ENCOUNTER — Ambulatory Visit
Admission: RE | Admit: 2019-02-11 | Discharge: 2019-02-11 | Disposition: A | Discharge status: Home / Self Care | Payer: No Typology Code available for payment source | Source: Ambulatory Visit | Attending: Obstetrics and Gynecology | Admitting: Obstetrics and Gynecology

## 2019-02-11 ENCOUNTER — Encounter (HOSPITAL_COMMUNITY): Payer: Self-pay

## 2019-02-11 ENCOUNTER — Ambulatory Visit (HOSPITAL_COMMUNITY)
Admission: RE | Admit: 2019-02-11 | Discharge: 2019-02-11 | Disposition: A | Discharge status: Home / Self Care | Payer: Self-pay | Source: Ambulatory Visit | Attending: Obstetrics and Gynecology | Admitting: Obstetrics and Gynecology

## 2019-02-11 ENCOUNTER — Other Ambulatory Visit: Payer: Self-pay

## 2019-02-11 DIAGNOSIS — Z1231 Encounter for screening mammogram for malignant neoplasm of breast: Secondary | ICD-10-CM

## 2019-02-11 DIAGNOSIS — Z1239 Encounter for other screening for malignant neoplasm of breast: Secondary | ICD-10-CM

## 2019-02-11 NOTE — Progress Notes (Signed)
No complaints today.   Pap Smear: Pap smear not completed today. Last Pap smear was 06/28/2017 at [facility] and normal with positive HPV. Per patient has no history of an abnormal Pap smear. Patient is scheduled for a Pap smear at the free cervical cancer screening on Monday, March 17, 2019 at 1500. Last Pap smear result is in Epic.  Physical exam: Breasts Breasts symmetrical. No skin abnormalities bilateral breasts. No nipple retraction bilateral breasts. No nipple discharge bilateral breasts. No lymphadenopathy. No lumps palpated bilateral breasts. No complaints of pain or tenderness on exam. Referred patient to the [facility] for a screening mammogram. Appointment scheduled for Tuesday, February 11, 2019 at 1330.        Pelvic/Bimanual No Pap smear completed today since patient refused.  Smoking History: Patient is a former smoker that quit in 2007.  Patient Navigation: Patient education provided. Access to services provided for patient through [facility] program.   Breast and Cervical Cancer Risk Assessment: Patient has no family history of breast cancer, known genetic mutations, or radiation treatment to the chest before age 4. Patient has no history of cervical dysplasia, immunocompromised, or DES exposure in-utero.  Risk Assessment    Risk Scores      02/11/2019 11/20/2017   Last edited by: Loletta Parish, RN Michele Hang, LPN   5-year risk: 0.9 % 0.8 %   Lifetime risk: 9.3 % 9.4 %

## 2019-02-11 NOTE — Addendum Note (Signed)
Encounter addended by: Loletta Parish, RN on: 02/11/2019 3:15 PM  Actions taken: Clinical Note Signed

## 2019-02-11 NOTE — Patient Instructions (Addendum)
Explained breast self awareness with [name]. Let patient know she is due for a Pap smear due to the HPV was positive on her last Pap smear. Patient is scheduled for a Pap smear at the free cervical cancer screening on Monday, March 17, 2019 at 1500. Referred patient to the [facility] for a screening mammogram. Appointment scheduled for Tuesday, February 11, 2019 at 1330. Patient aware of appointments and will be there. Let [facility] know [facility] will [facility] with her within the next couple of weeks with results of her mammogram by letter or phone. Darrol Poke [place] verbalized understanding.  Revis Whalin, Arvil Chaco, RN 10:55 AM

## 2019-02-12 ENCOUNTER — Ambulatory Visit: Payer: Self-pay | Admitting: Physical Therapy

## 2019-02-13 ENCOUNTER — Other Ambulatory Visit: Payer: No Typology Code available for payment source

## 2019-02-18 ENCOUNTER — Other Ambulatory Visit: Payer: Self-pay

## 2019-02-18 ENCOUNTER — Ambulatory Visit (INDEPENDENT_AMBULATORY_CARE_PROVIDER_SITE_OTHER): Payer: Self-pay | Admitting: Clinical

## 2019-02-18 ENCOUNTER — Telehealth: Payer: Self-pay | Admitting: Physical Therapy

## 2019-02-18 ENCOUNTER — Ambulatory Visit: Payer: Self-pay | Admitting: Physical Therapy

## 2019-02-18 DIAGNOSIS — F332 Major depressive disorder, recurrent severe without psychotic features: Secondary | ICD-10-CM

## 2019-02-18 NOTE — Telephone Encounter (Signed)
Called and left a message. No-showed to appointment at 9:30 AM.  Earlie Counts, PT @12 /29/2020@ 10:18 AM

## 2019-02-19 NOTE — BH Specialist Note (Signed)
Integrated Behavioral Health via Telemedicine Video Visit  Patient and/or legal guardian verbally consented to [facility] services about presenting concerns and psychiatric consultation as appropriate.   02/19/2019 [name] [id number]  Number of Integrated Behavioral Health visits: 4 Session Start time: 8:48  Session End time: 9:02 Total time: 14  Referring Provider: Barrington Ellison, MD Type of Visit: Video Patient/Family location: Home [facility] Provider location: [facility] All persons participating in visit: Patient Tarneshia Henriksen and [place]    Confirmed patient's address: Yes  Confirmed patient's phone number: Yes  Any changes to demographics: No   Confirmed patient's insurance: Yes  Any changes to patient's insurance: No   Discussed confidentiality: At previous visit  I connected with [name] by a video enabled telemedicine application and verified that I am speaking with the correct person using two identifiers.     I discussed the limitations of evaluation and management by telemedicine and the availability of in person appointments.  I discussed that the purpose of this visit is to provide behavioral health care while limiting exposure to the novel coronavirus.   Discussed there is a possibility of technology failure and discussed alternative modes of communication if that failure occurs.  I discussed that engaging in this video visit, they consent to the provision of behavioral healthcare and the services will be billed under their insurance.  Patient and/or legal guardian expressed understanding and consented to video visit: Yes   PRESENTING CONCERNS: Patient and/or family reports the following symptoms/concerns: Pt states she is taking Elavil nightly now, and began having nightmares and waking up sleepy, with no other side effects. Pt agrees to psychiatry referral, and will continue taking Elavil as  prescribed.  Duration of problem: Increase during covid19 pandemic; Severity of problem: severe  STRENGTHS (Protective Factors/Coping Skills): Self-awareness, uses self-coping strategies; supportive husband  GOALS ADDRESSED: Patient will: 1.  Reduce symptoms of: anxiety and depression   INTERVENTIONS: Interventions utilized:  Medication Monitoring and Psychoeducation and/or Health Education Standardized Assessments completed: Not today; will assess at next visit  ASSESSMENT: Patient currently experiencing Major depressive disorder, recurrent, severe, without psychotic features .   Patient may benefit from continued psychoeducation and brief therapeutic interventions regarding coping with symptoms of depression and anxiety .  PLAN: 1. Follow up with behavioral health clinician on : Two weeks 2. Behavioral recommendations:  -Continue taking Elavil, as prescribed -Accept referral to psychiatry (make sure phone voicemail is not full, to receive call) 3. Referral(s): [place] (In Clinic) and Psychiatrist  I discussed the assessment and treatment plan with the patient and/or parent/guardian. They were provided an opportunity to ask questions and all were answered. They agreed with the plan and demonstrated an understanding of the instructions.   They were advised to call back or seek an in-person evaluation if the symptoms worsen or if the condition fails to improve as anticipated.  [name] Issaih Kaus

## 2019-02-20 ENCOUNTER — Ambulatory Visit: Payer: Self-pay | Admitting: Physical Therapy

## 2019-02-25 ENCOUNTER — Ambulatory Visit: Payer: No Typology Code available for payment source | Admitting: Physical Therapy

## 2019-02-27 ENCOUNTER — Encounter: Payer: Self-pay | Admitting: Physical Therapy

## 2019-02-27 ENCOUNTER — Ambulatory Visit: Payer: No Typology Code available for payment source | Attending: Family Medicine | Admitting: Physical Therapy

## 2019-02-27 ENCOUNTER — Other Ambulatory Visit: Payer: Self-pay

## 2019-02-27 DIAGNOSIS — M545 Low back pain, unspecified: Secondary | ICD-10-CM

## 2019-02-27 DIAGNOSIS — R252 Cramp and spasm: Secondary | ICD-10-CM | POA: Insufficient documentation

## 2019-02-27 DIAGNOSIS — R278 Other lack of coordination: Secondary | ICD-10-CM | POA: Insufficient documentation

## 2019-02-27 DIAGNOSIS — G8929 Other chronic pain: Secondary | ICD-10-CM | POA: Insufficient documentation

## 2019-02-27 DIAGNOSIS — M62830 Muscle spasm of back: Secondary | ICD-10-CM | POA: Insufficient documentation

## 2019-02-27 NOTE — Therapy (Addendum)
[facility] [address] [place], [place], [address] Phone: [phone]   Fax:  [phone]  Physical Therapy Treatment  Patient Details  Name: Michele Trevino MRN: [id number] Date of Birth: 02-19-1976 Referring Provider (PT): Dr. Barrington Ellison   Encounter Date: 02/27/2019  PT End of Session - 02/27/19 1624    Visit Number  6    Date for PT Re-Evaluation  04/09/19    Authorization Type  no insurance    PT Start Time  7412    PT Stop Time  1655    PT Time Calculation (min)  40 min    Activity Tolerance  Patient tolerated treatment well;No increased pain    Behavior During Therapy  WFL for tasks assessed/performed       Past Medical History:  Diagnosis Date  . Arthritis   . Carpal tunnel syndrome     Past Surgical History:  Procedure Laterality Date  . CESAREAN SECTION    . TUBAL LIGATION      There were no vitals filed for this visit.  Subjective Assessment - 02/27/19 1625    Subjective  Today I am aching all over. My back is in pain. The pelvic floor pain is off and on.    Patient Stated Goals  alleviate the pain    Currently in Pain?  Yes    Pain Score  6     Pain Location  Back    Pain Orientation  Upper;Lower    Pain Descriptors / Indicators  Spasm    Pain Type  Chronic pain    Pain Onset  More than a month ago    Pain Frequency  Constant    Aggravating Factors   sitting and standing too long, bending forward    Pain Relieving Factors  take pain pills    Multiple Pain Sites  No         [facility] PT Assessment - 02/27/19 0001      AROM   Lumbar Flexion  decreased by 50%   pain   Lumbar Extension  decreased by 25%   felt good   Lumbar - Right Side Bend  decreased by 25%   pain   Lumbar - Left Side Bend  decreased by 25%   pain   Lumbar - Right Rotation  decreased by 25%   pain   Lumbar - Left Rotation  decreased by 25%   pain     Palpation   SI assessment   pelvis in correct  alignment                Pelvic Floor Special Questions - 02/27/19 0001    Pad use  wears poise pad    Activities that cause leaking  With strong urge;Laughing;Sneezing    Urinary urgency  Yes    Pelvic Floor Internal Exam  Patient confirms identification and approves PT to assess pelvic floor and treatment    Exam Type  Vaginal        [facility] Adult PT Treatment/Exercise - 02/27/19 0001      Neuro Re-ed    Neuro Re-ed Details   diaphragmatic breathing and bulging of the pelvic floor      Lumbar Exercises: Stretches   Double Knee to Chest Stretch  2 reps;20 seconds    Lower Trunk Rotation  2 reps;30 seconds      Lumbar Exercises: Aerobic   Recumbent Bike  level 3 5 minutes while assessing the patient  Manual Therapy   Manual Therapy  Soft tissue mobilization;Internal Pelvic Floor    Soft tissue mobilization  left lumbar paraspinals and quadratus    Internal Pelvic Floor  bil. obturator internist and levator ani while monitoring for pain. when touch the right piriformis caused increased in left lumbar pain, when touch the left pelvic floor increased left lumbar pain       Trigger Point Dry Needling - 02/27/19 0001    Consent Given?  Yes    Education Handout Provided  Previously provided    Muscles Treated Back/Hip  Lumbar multifidi   left side of L5   Lumbar multifidi Response  Twitch response elicited;Palpable increased muscle length           PT Education - 02/27/19 1656    Education Details  discussed with patient to work on perineal massage and to perform at home    Person(s) Educated  Patient    Methods  Explanation    Comprehension  Verbalized understanding       PT Short Term Goals - 02/27/19 1701      PT SHORT TERM GOAL #1   Title  independent with initial HEP    Time  4    Period  Weeks    Status  Achieved    Target Date  02/12/19      PT SHORT TERM GOAL #2   Title  understand ways to control pain with theroy on pain and meditation     Time  4    Period  Weeks    Status  Achieved      PT SHORT TERM GOAL #3   Title  understand self perineal massage to relax the muscles and prepare for internal work    Time  4    Period  Weeks    Status  On-going        PT Long Term Goals - 01/15/19 1249      PT LONG TERM GOAL #1   Title  Pt will be I and compliant with HEP    Baseline  --    Period  Weeks    Status  New    Target Date  04/09/19      PT LONG TERM GOAL #2   Title  Marinoff score </=1/3 due to improve elongation of the pelvic floor muscles and relaxation breathing    Baseline  --    Time  12    Period  Weeks    Status  New    Target Date  04/09/19      PT LONG TERM GOAL #3   Title  sit for 30 minutes for a car ride or TV show with minimal pain due to improved tissue mobility    Baseline  --    Time  12    Period  Weeks    Status  New    Target Date  04/09/19      PT LONG TERM GOAL #4   Title  able to have a bowel movement without straining due to ablility to relax the pelvic floor and understanding correct body mechanics    Time  12    Period  Weeks    Status  New    Target Date  04/09/19      PT LONG TERM GOAL #5   Title  walk with pain minimal due to improved lumbar ROM and bilateral hip strength >/= 4/5    Baseline  ---  Period  Weeks    Status  New    Target Date  04/09/19            Plan - 02/27/19 1624    Clinical Impression Statement  Patient was able to bulge the pelvic floor with filling her abdomen with air. Patient has no change in urinary leakage. Physical therapist was able to place her whole index finger into the vaginal canal for first time. When therapist touches the right piriformis and left pelvic floor increased left lumbar pain. Patient only tolerated one dry needling on left side of L5 then her back pain increased. Patient has increased tightness in the lumbar paraspinals. Patient has not met goals yet. Patient has not been to therapy for several weeks due to feeling  sick. Patient will benefit from skilled therapy to reduce pain, improve elongation of tissue and restore function.    Personal Factors and Comorbidities  Age;Fitness;Comorbidity 1;Time since onset of injury/illness/exacerbation;Sex    Comorbidities  c-scetion 3x    Examination-Activity Limitations  Toileting;Continence;Stairs;Stand;Lift;Sit;Sleep;Squat;Bed Mobility;Locomotion Level;Bend;Caring for Others    Examination-Participation Restrictions  Community Activity;Interpersonal Relationship;Laundry;Cleaning;Shop    Stability/Clinical Decision Making  Evolving/Moderate complexity    Rehab Potential  Excellent    PT Frequency  2x / week    PT Duration  12 weeks    PT Treatment/Interventions  ADLs/Self Care Home Management;Biofeedback;Cryotherapy;Electrical Stimulation;Ultrasound;Moist Heat;Traction;Iontophoresis '4mg'$ /ml Dexamethasone;Gait training;Functional mobility training;Therapeutic activities;Therapeutic exercise;Neuromuscular re-education;Patient/family education;Dry needling;Passive range of motion;Scar mobilization;Manual techniques;Taping;Spinal Manipulations;Joint Manipulations    PT Next Visit Plan  work on internal massage,review perineal massage for home, bulging of the pelvic floor, review HEP    PT Home Exercise Plan  Access Code: [email or web address]    Consulted and Agree with Plan of Care  Patient       Patient will benefit from skilled therapeutic intervention in order to improve the following deficits and impairments:  Decreased coordination, Decreased range of motion, Increased fascial restricitons, Difficulty walking, Increased muscle spasms, Pain, Decreased strength, Decreased mobility, Impaired flexibility  Visit Diagnosis: Chronic bilateral low back pain without sciatica  Other lack of coordination  Cramp and spasm  Muscle spasm of back     Problem List Patient Active Problem List   Diagnosis Date Noted  . Screening breast examination 02/11/2019  . Constipation  08/14/2018  . Fatigue 08/14/2018  . Insomnia 08/14/2018  . Vaginal discharge 08/14/2018  . Hospital discharge follow-up 08/14/2018  . 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 OBESITY, NOS 04/19/2006  . BLEEDING, RECTAL 04/19/2006    Earlie Counts, PT 02/27/19 5:03 PM   [facility] [address] [place], [place], [address] Phone: [phone]   Fax:  [phone]  Name: Michele Trevino MRN: [id number] Date of Birth: [birth year]  PHYSICAL THERAPY DISCHARGE SUMMARY  Visits from Start of Care: 6  Current functional level related to goals / functional outcomes: See above. Patient has not been reassessed due to not returning since 02/27/2019.  Remaining deficits: See above.    Education / Equipment: HEP Plan:                                                    Patient goals were not met. Patient is being discharged due to not returning since the last visit. Thank you for the referral. Earlie Counts, PT 04/21/19 2:04 PM   ?????

## 2019-02-28 ENCOUNTER — Ambulatory Visit: Payer: Self-pay | Admitting: Clinical

## 2019-02-28 DIAGNOSIS — F332 Major depressive disorder, recurrent severe without psychotic features: Secondary | ICD-10-CM

## 2019-03-04 ENCOUNTER — Ambulatory Visit: Payer: No Typology Code available for payment source | Admitting: Physical Therapy

## 2019-03-04 ENCOUNTER — Telehealth: Payer: Self-pay | Admitting: Physical Therapy

## 2019-03-04 NOTE — Telephone Encounter (Signed)
Called patient and left message to return phone call. Called about her missing her appointment at 4:15 PM. Earlie Counts, PT @1 /01/2020@ 4:49 PM

## 2019-03-05 NOTE — BH Specialist Note (Signed)
Integrated Behavioral Health via Telemedicine Video Visit  03/05/2019 SRISHTI LABRAKE [id number]  Number of [place] visits: 5 Session Start time: 8:19  Session End time: 8:40 Total time: 21  Referring Provider: Barrington Ellison, MD Type of Visit: Video Patient/Family location: Home [facility] Provider location: [facility] All persons participating in visit: [name] and [place]    Confirmed patient's address: Yes  Confirmed patient's phone number: Yes  Any changes to demographics: No   Confirmed patient's insurance: Yes  Any changes to patient's insurance: No   Discussed confidentiality: At previous visit  I connected with [name]  by a video enabled telemedicine application and verified that I am speaking with the correct person using two identifiers.     I discussed the limitations of evaluation and management by telemedicine and the availability of in person appointments.  I discussed that the purpose of this visit is to provide behavioral health care while limiting exposure to the novel coronavirus.   Discussed there is a possibility of technology failure and discussed alternative modes of communication if that failure occurs.  I discussed that engaging in this video visit, they consent to the provision of behavioral healthcare and the services will be billed under their insurance.  Patient and/or legal guardian expressed understanding and consented to video visit: Yes   PRESENTING CONCERNS: Patient and/or family reports the following symptoms/concerns: Pt states her primary concern today is chronic back pain; Elavil is helping reduce back pain during sleep/laying down, and to increase feelings of calm, but is also increasing "lack of sleep, crazy dreams and nightmares". Back pain increases with any movement; pt is using fresh and bagged turmeric and ginger teas to aid in reducing back pain.  Duration of problem:  Increase during covid19 pandemic; Severity of problem: severe  STRENGTHS (Protective Factors/Coping Skills): Self-awareness, using self-coping strategies, and supportive [place]  GOALS ADDRESSED: Patient will: 1.  Reduce symptoms of: anxiety and depression   INTERVENTIONS: Interventions utilized:  Supportive Counseling and Medication Monitoring Standardized Assessments completed: not given today  ASSESSMENT: Patient currently experiencing Major depressive disorder, recurrent, severe, without psychotic features.   Patient may benefit from continued psychoeducation and brief therapeutic interventions regarding coping with symptoms of depression, anxiety and chronic pain .  PLAN: 1. Follow up with behavioral health clinician on : As needed 2. Behavioral recommendations:  -Continue taking Elavil as prescribed (will contact pt if change in medication) -Continue using self-coping strategies that have been somewhat effective to reduce back pain -Continue with upcoming rehabilitation appointments -Contact [facility] [phone] for initial appointment 3. Referral(s): Integrated [facility] (In Clinic) and [facility] (LME/Outside Clinic)  I discussed the assessment and treatment plan with the patient and/or parent/guardian. They were provided an opportunity to ask questions and all were answered. They agreed with the plan and demonstrated an understanding of the instructions.   They were advised to call back or seek an in-person evaluation if the symptoms worsen or if the condition fails to improve as anticipated.  [name] Michele Trevino

## 2019-03-06 ENCOUNTER — Telehealth: Payer: Self-pay | Admitting: Physical Therapy

## 2019-03-06 ENCOUNTER — Ambulatory Visit: Payer: Self-pay | Admitting: Physical Therapy

## 2019-03-06 NOTE — Telephone Encounter (Signed)
Called patient about no-show her appointment at 4:15 PM. Left a message.  Earlie Counts, PT @1 /14/2021@ 4:46 PM

## 2019-03-10 ENCOUNTER — Telehealth: Payer: Self-pay | Admitting: Physical Therapy

## 2019-03-10 ENCOUNTER — Ambulatory Visit: Payer: Self-pay | Admitting: Physical Therapy

## 2019-03-10 NOTE — Telephone Encounter (Signed)
Called patient to remind her about her appointment at 4:15 PM today. Asked patient to call prior to appointment to make sure she is coming.  Earlie Counts, PT @1 /18/2021@ 10:12 AM

## 2019-03-12 ENCOUNTER — Encounter: Payer: Self-pay | Admitting: Physical Therapy

## 2019-03-14 ENCOUNTER — Ambulatory Visit (INDEPENDENT_AMBULATORY_CARE_PROVIDER_SITE_OTHER): Payer: Self-pay | Admitting: Clinical

## 2019-03-14 ENCOUNTER — Other Ambulatory Visit: Payer: Self-pay

## 2019-03-14 DIAGNOSIS — F332 Major depressive disorder, recurrent severe without psychotic features: Secondary | ICD-10-CM

## 2019-03-17 ENCOUNTER — Other Ambulatory Visit: Payer: Self-pay

## 2019-03-17 ENCOUNTER — Other Ambulatory Visit: Payer: Self-pay | Admitting: *Deleted

## 2019-03-17 ENCOUNTER — Ambulatory Visit: Payer: Self-pay | Admitting: Physical Therapy

## 2019-03-17 DIAGNOSIS — Z124 Encounter for screening for malignant neoplasm of cervix: Secondary | ICD-10-CM

## 2019-03-17 DIAGNOSIS — N898 Other specified noninflammatory disorders of vagina: Secondary | ICD-10-CM

## 2019-03-17 NOTE — Progress Notes (Signed)
Patient: Michele Trevino           Date of Birth: [birth year]           MRN: [id number] Visit Date: 03/17/2019 PCP: Kerin Perna, NP Temp: 98.2 Temporal    Cervical Exam Thick white vaginal discharge observed in vagina and on cervix. Wet prep completed. [facility] Patient stated she has a history of an abnormal Pap smear today and per previous assessment 02/11/2019 patient stated has no history of an abnormal Pap smear. Next Pap smear will be due based on the result of today's Pap smear.   Patient's History Patient Active Problem List   Diagnosis Date Noted  . Screening breast examination 02/11/2019  . Constipation 08/14/2018  . Fatigue 08/14/2018  . Insomnia 08/14/2018  . Vaginal discharge 08/14/2018  . Hospital discharge follow-up 08/14/2018  . 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 OBESITY, NOS 04/19/2006  . BLEEDING, RECTAL 04/19/2006   Past Medical History:  Diagnosis Date  . Arthritis   . Carpal tunnel syndrome     Family History  Problem Relation Age of Onset  . Throat cancer Mother   . Diabetes Father   . Hypertension Father     Social History   Occupational History  . Not on file  Tobacco Use  . Smoking status: Former Smoker    Quit date: 2005    Years since quitting: 16.0  . Smokeless tobacco: Never Used  Substance and Sexual Activity  . Alcohol use: Yes    Comment: socially   . Drug use: No  . Sexual activity: Yes    Birth control/protection: Surgical

## 2019-03-18 LAB — CERVICOVAGINAL ANCILLARY ONLY
Bacterial Vaginitis (gardnerella): POSITIVE — AB
Candida Glabrata: NEGATIVE
Candida Vaginitis: NEGATIVE
Chlamydia: NEGATIVE
Comment: NEGATIVE
Comment: NEGATIVE
Comment: NEGATIVE
Comment: NEGATIVE
Comment: NEGATIVE
Comment: NORMAL
Neisseria Gonorrhea: NEGATIVE
Trichomonas: NEGATIVE

## 2019-03-18 LAB — CYTOLOGY - PAP
Adequacy: ABSENT
Comment: NEGATIVE
Diagnosis: NEGATIVE
High risk HPV: NEGATIVE

## 2019-03-19 ENCOUNTER — Encounter: Payer: Self-pay | Admitting: Physical Therapy

## 2019-03-21 ENCOUNTER — Ambulatory Visit: Payer: Self-pay | Admitting: Urology

## 2019-03-21 DIAGNOSIS — N3946 Mixed incontinence: Secondary | ICD-10-CM | POA: Insufficient documentation

## 2019-03-21 DIAGNOSIS — N390 Urinary tract infection, site not specified: Secondary | ICD-10-CM | POA: Insufficient documentation

## 2019-03-21 DIAGNOSIS — R339 Retention of urine, unspecified: Secondary | ICD-10-CM | POA: Insufficient documentation

## 2019-03-21 DIAGNOSIS — N761 Subacute and chronic vaginitis: Secondary | ICD-10-CM | POA: Insufficient documentation

## 2019-03-21 NOTE — Progress Notes (Deleted)
Subjective:  CC/HPI: Urinary Tract Infections     Michele Trevino is sent by Juluis Mire, NP for recurrent UTI's that began in early 2019 after she got married. The infections were initially post coital but became more frequent without associated to sex. She is currently symptoms free other than some itching in the vaginal area. She does have mixed incontinence with both stress and urge leakage. She has no dysuria or hematuria today. She had hemorrhagic cystitis x 2. A CT in June was negative. She last had cultures in October with Enterococcus and E. coli on 10/11 and Klebsiella on 10/7. She has had no stones or GU surgery.     ALLERGIES: latex    MEDICATIONS: Amitriptyline Hcl  Cetirizine Hcl  Nasal Spray  Proventil Hfa     GU PSH: None   NON-GU PSH: C-Section     GU PMH: None   NON-GU PMH: Anxiety Asthma Depression    FAMILY HISTORY: None   SOCIAL HISTORY: Marital Status: Married Preferred Language: English; Ethnicity: Not Hispanic Or Latino; Race: Black or African American Current Smoking Status: Patient does not smoke anymore. Has not smoked since 01/20/2002.   Tobacco Use Assessment Completed: Used Tobacco in last 30 days? Drinks 3 drinks per week.  Drinks 2 caffeinated drinks per day. 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 Patient denies frequent urination, burning /pain with urination, stream starts and stops, trouble starting your stream, have to strain to urinate, and being pregnant.  Gastrointestinal (Upper):   Patient reports indigestion/ heartburn. Patient denies nausea and vomiting.  Gastrointestinal (Lower):   Patient reports constipation. Patient denies diarrhea.  Constitutional:   Patient reports night sweats and fatigue. Patient denies fever and weight loss.  Skin:   Patient reports itching. Patient  denies skin rash/ lesion.  Eyes:   Patient reports blurred vision. Patient denies double vision.  Ears/ Nose/ Throat:   Patient reports sinus problems. Patient denies sore throat.  Hematologic/Lymphatic:   Patient denies swollen glands and easy bruising.  Cardiovascular:   Patient denies leg swelling and chest pains.  Respiratory:   Patient denies cough and shortness of breath.  Endocrine:   Patient denies excessive thirst.  Musculoskeletal:   Patient reports back pain and joint pain.   Neurological:   Patient reports headaches. Patient denies dizziness.  Psychologic:   Patient reports depression and anxiety.    VITAL SIGNS:      01/24/2019 02:01 PM  Weight 208 lb / 94.35 kg  Height 61 in / 154.94 cm  BP 132/85 mmHg  Heart Rate 69 /min  Temperature 97.9 F / 36.6 C  BMI 39.3 kg/m   GU PHYSICAL EXAMINATION:    External Genitalia: No hirsutism, no rash, no scarring, no cyst, no erythematous lesion, no papular lesion, no blanched lesion, no warty lesion. No edema.  Urethral Meatus: Normal size. Normal position. No discharge.  Urethra: No tenderness, no mass, no scarring. No hypermobility. No leakage.  Bladder: Normal to palpation, no tenderness, no mass, normal size.  Vagina: No atrophy, no stenosis. No rectocele. No cystocele. No enterocele. mild greenish gray discharge.   Cervix: No lesions or descent.  Uterus: NOt easily palpable.   Adnexa / Parametria: No tenderness. No adnexal mass.    MULTI-SYSTEM PHYSICAL EXAMINATION:    Constitutional: Obese. No physical deformities. Normally  developed. Good grooming.   Neck: Neck symmetrical, not swollen. Normal tracheal position.  Respiratory: Normal breath sounds. No labored breathing, no use of accessory muscles.   Cardiovascular: Regular rate and rhythm. No murmur, no gallop.   Lymphatic: No enlargement, no tenderness of supraclavicular, groin, neck lymph nodes.  Skin: No paleness, no jaundice, no cyanosis. No lesion, no ulcer, no rash.   Neurologic / Psychiatric: Oriented to time, oriented to place, oriented to person. No depression, no anxiety, no agitation.  Gastrointestinal: Abdominal tenderness mild suprapubic, obese. No mass, no rigidity.   Musculoskeletal: Normal gait and station of head and neck.     PAST DATA REVIEWED:  Source Of History:  Patient  Records Review:   Previous Doctor Records  Urine Test Review:   Urinalysis, Urine Culture  Urodynamics Review:   Review Bladder Scan  X-Ray Review: C.T. Abdomen/Pelvis: Reviewed Films. Reviewed Report. Discussed With Patient.    Notes:                     I have reviewed records from her PCP.    PROCEDURES:         PVR Ultrasound - [id number]  Scanned Volume: 143.9 cc         Urinalysis - 81003 Dipstick Dipstick Cont'd  Specimen: Voided Bilirubin: Neg  Color: Yellow Ketones: Neg  A  pearance: Clear Blood: Neg  Specific Gravity: 1.015 Protein: Neg  pH: 6.0 Urobilinogen: 0.2  Glucose: Neg Nitrites: Neg    Leukocyte Esterase: Neg    ASSESSMENT:      ICD-10 Details  1 GU:   Personal Hx Urinary Tract Infections - Z87.440 I will have her take macrobid post coitally. F/u in 6-8 weeks.   2   Subacute and chronic vaginitis - N76.1 I am going to give her metrogel to see if that will help her vaginal symptoms.   3   Mixed incontinence - N39.46 IF this doesn't improve with the above, I will consider further evaluation.   4   Incomplete bladder emptying - R39.14 her PVR is 114ml and I discussed postural changes to complete voiding.    PLAN:            Medications New Meds: Macrobid 100 mg capsule 1 capsule PO Daily   #30  3 Refill(s)  Metronidazole [id number] % gel with applicator 10 gram Per Vagina Q HS   #70  0 Refill(s)            Schedule Return Visit/Planned Activity: 6-8 Weeks - Office Visit       ROS:  ROS:  A complete review of systems was performed.  All systems are negative except for pertinent findings as noted.   ROS  Allergies  Allergen  Reactions  . Latex Swelling    Outpatient Encounter Medications as of 03/21/2019  Medication Sig  . albuterol (VENTOLIN HFA) 108 (90 Base) MCG/ACT inhaler Inhale 1-2 puffs into the lungs every 6 (six) hours as needed for wheezing or shortness of breath.  Marland Kitchen amitriptyline (ELAVIL) 10 MG tablet Take 1 tablet (10 mg total) by mouth at bedtime.  . cetirizine (ZYRTEC) 10 MG tablet Take 10 mg by mouth daily as needed for allergies.  . fluticasone (FLONASE) 50 MCG/ACT nasal spray Place 2 sprays into both nostrils daily. (Patient taking differently: Place 2 sprays into both nostrils daily as needed for allergies. )  . ibuprofen (ADVIL) 600 MG tablet Take 1 tablet (600 mg total) by mouth every 6 (six)  hours as needed.  . loratadine (CLARITIN) 10 MG tablet Take 1 tablet (10 mg total) by mouth daily.   No facility-administered encounter medications on file as of 03/21/2019.    Past Medical History:  Diagnosis Date  . Arthritis   . Carpal tunnel syndrome     Past Surgical History:  Procedure Laterality Date  . CESAREAN SECTION    . TUBAL LIGATION      Social History   Socioeconomic History  . Marital status: Married    Spouse name: Not on file  . Number of children: Not on file  . Years of education: Not on file  . Highest education level: Some college, no degree  Occupational History  . Not on file  Tobacco Use  . Smoking status: Former Smoker    Quit date: 2005    Years since quitting: 16.0  . Smokeless tobacco: Never Used  Substance and Sexual Activity  . Alcohol use: Yes    Comment: socially   . Drug use: No  . Sexual activity: Yes    Birth control/protection: Surgical  Other Topics Concern  . Not on file  Social History Narrative  . Not on file   Social Determinants of Health   Financial Resource Strain:   . Difficulty of Paying Living Expenses: Not on file  Food Insecurity: Food Insecurity Present  . Worried About Charity fundraiser in the Last Year: Sometimes true   . Ran Out of Food in the Last Year: Sometimes true  Transportation Needs: No Transportation Needs  . Lack of Transportation (Medical): No  . Lack of Transportation (Non-Medical): No  Physical Activity:   . Days of Exercise per Week: Not on file  . Minutes of Exercise per Session: Not on file  Stress:   . Feeling of Stress : Not on file  Social Connections:   . Frequency of Communication with Friends and Family: Not on file  . Frequency of Social Gatherings with Friends and Family: Not on file  . Attends Religious Services: Not on file  . Active Member of Clubs or Organizations: Not on file  . Attends Archivist Meetings: Not on file  . Marital Status: Not on file  Intimate Partner Violence:   . Fear of Current or Ex-Partner: Not on file  . Emotionally Abused: Not on file  . Physically Abused: Not on file  . Sexually Abused: Not on file    Family History  Problem Relation Age of Onset  . Throat cancer Mother   . Diabetes Father   . Hypertension Father        Objective: There were no vitals filed for this visit.   Physical Exam  Lab Results:  No results found for this or any previous visit (from the past 24 hour(s)).  BMET No results for input(s): NA, K, CL, CO2, GLUCOSE, BUN, CREATININE, CALCIUM in the last 72 hours. PSA No results found for: PSA No results found for: TESTOSTERONE    Studies/Results: No results found.    Assessment & Plan: No problem-specific Assessment & Plan notes found for this encounter.    No orders of the defined types were placed in this encounter.    No orders of the defined types were placed in this encounter.     No follow-ups on file.   CC: Kerin Perna, NP      Irine Seal 03/21/2019

## 2019-03-24 ENCOUNTER — Other Ambulatory Visit (HOSPITAL_COMMUNITY): Payer: Self-pay | Admitting: *Deleted

## 2019-03-24 ENCOUNTER — Telehealth (HOSPITAL_COMMUNITY): Payer: Self-pay

## 2019-03-24 MED ORDER — METRONIDAZOLE 500 MG PO TABS: 500.0000 mg | ORAL_TABLET | Freq: Two times a day (BID) | ORAL | 0 refills | Status: DC

## 2019-03-24 NOTE — Telephone Encounter (Signed)
Patient returned call to office and was informed pap smear normal with negative HPV, due for next pap smear in 3 years, and positive for Bacterial Vaginosis. Discussed with patient what Bacterial vaginosis is and recommendations to reduce/prevent future occurrences(use milder soaps, such as Dove or Summer's Eve wash, d/c bacterial body soaps such as Dial, which can disrupt normal flora). Treatment is oral Flagyl(Metronidazole), to be taken BID x 7 days, avoid alcohol. Pharmacy: [facility]. Rx sent to pharmacy per Dr. Elly Modena.

## 2019-03-24 NOTE — Telephone Encounter (Signed)
Left message on identifying voicemail requesting patient to return call to office.

## 2019-06-02 ENCOUNTER — Other Ambulatory Visit: Payer: No Typology Code available for payment source

## 2019-06-04 ENCOUNTER — Other Ambulatory Visit: Payer: Self-pay

## 2019-06-04 ENCOUNTER — Emergency Department (HOSPITAL_COMMUNITY)
Admission: EM | Admit: 2019-06-04 | Discharge: 2019-06-05 | Disposition: A | Discharge status: Home / Self Care | Payer: No Typology Code available for payment source | Attending: Emergency Medicine | Admitting: Emergency Medicine

## 2019-06-04 ENCOUNTER — Ambulatory Visit: Payer: No Typology Code available for payment source | Attending: Internal Medicine

## 2019-06-04 ENCOUNTER — Encounter (HOSPITAL_COMMUNITY): Payer: Self-pay

## 2019-06-04 DIAGNOSIS — Z20822 Contact with and (suspected) exposure to covid-19: Secondary | ICD-10-CM

## 2019-06-04 DIAGNOSIS — Z5321 Procedure and treatment not carried out due to patient leaving prior to being seen by health care provider: Secondary | ICD-10-CM | POA: Insufficient documentation

## 2019-06-04 DIAGNOSIS — M549 Dorsalgia, unspecified: Secondary | ICD-10-CM | POA: Insufficient documentation

## 2019-06-04 DIAGNOSIS — R079 Chest pain, unspecified: Secondary | ICD-10-CM | POA: Insufficient documentation

## 2019-06-04 DIAGNOSIS — R0602 Shortness of breath: Secondary | ICD-10-CM | POA: Insufficient documentation

## 2019-06-04 LAB — BASIC METABOLIC PANEL
Anion gap: 11 (ref 5–15)
BUN: <5 mg/dL — ABNORMAL LOW (ref 6–20)
CO2: 23 mmol/L (ref 22–32)
Calcium: 8.6 mg/dL — ABNORMAL LOW (ref 8.9–10.3)
Chloride: 103 mmol/L (ref 98–111)
Creatinine, Ser: 1.06 mg/dL — ABNORMAL HIGH (ref 0.44–1.00)
GFR calc Af Amer: >60 mL/min (ref >60)
GFR calc non Af Amer: >60 mL/min (ref >60)
Glucose, Bld: 107 mg/dL — ABNORMAL HIGH (ref 70–99)
Potassium: 4 mmol/L (ref 3.5–5.1)
Sodium: 137 mmol/L (ref 135–145)

## 2019-06-04 LAB — CBC
HCT: 33.9 % — ABNORMAL LOW (ref 36.0–46.0)
Hemoglobin: 10.1 g/dL — ABNORMAL LOW (ref 12.0–15.0)
MCH: 23 pg — ABNORMAL LOW (ref 26.0–34.0)
MCHC: 29.8 g/dL — ABNORMAL LOW (ref 30.0–36.0)
MCV: 77.2 fL — ABNORMAL LOW (ref 80.0–100.0)
Platelets: 272 10*3/uL (ref 150–400)
RBC: 4.39 MIL/uL (ref 3.87–5.11)
RDW: 16.8 % — ABNORMAL HIGH (ref 11.5–15.5)
WBC: 4.7 10*3/uL (ref 4.0–10.5)
nRBC: 0 % (ref 0.0–0.2)

## 2019-06-04 LAB — I-STAT BETA HCG BLOOD, ED (MC, WL, AP ONLY): I-stat hCG, quantitative: <5 m[IU]/mL (ref <5)

## 2019-06-04 LAB — TROPONIN I (HIGH SENSITIVITY): Troponin I (High Sensitivity): 8 ng/L (ref <18)

## 2019-06-04 MED ORDER — SODIUM CHLORIDE 0.9% FLUSH: 3.0000 mL | Freq: Once | INTRAVENOUS | Status: DC

## 2019-06-04 NOTE — ED Triage Notes (Signed)
Pt reports that she began she began to have SOB and back pain yesterday with CP

## 2019-06-05 ENCOUNTER — Other Ambulatory Visit (INDEPENDENT_AMBULATORY_CARE_PROVIDER_SITE_OTHER): Payer: Self-pay | Admitting: Primary Care

## 2019-06-05 ENCOUNTER — Telehealth (INDEPENDENT_AMBULATORY_CARE_PROVIDER_SITE_OTHER): Payer: Self-pay

## 2019-06-05 ENCOUNTER — Encounter: Payer: Self-pay | Admitting: *Deleted

## 2019-06-05 LAB — NOVEL CORONAVIRUS, NAA: SARS-CoV-2, NAA: DETECTED — AB

## 2019-06-05 LAB — SARS-COV-2, NAA 2 DAY TAT

## 2019-06-05 MED ORDER — ALBUTEROL SULFATE HFA 108 (90 BASE) MCG/ACT IN AERS: 1.0000 | INHALATION_SPRAY | Freq: Four times a day (QID) | RESPIRATORY_TRACT | 1 refills | Status: DC | PRN

## 2019-06-05 NOTE — Telephone Encounter (Signed)
Patient name and DOB verified.  Aware that medication was sent to pharmacy: albuterol (VENTOLIN HFA) 108 (90 Base) MCG/ACT inhaler.

## 2019-06-05 NOTE — ED Notes (Signed)
Called pt for vitals with no answer x 3

## 2019-06-05 NOTE — Telephone Encounter (Signed)
Sent in albuterol HFA unable to provide nebulizer 911 or return to hospital is on your discharge papers the one of the most important one is respiratory problems . Go to ED

## 2019-06-05 NOTE — Telephone Encounter (Signed)
Patient called to make a medication refill for  albuterol (VENTOLIN HFA) 108 (90 Base) MCG/ACT inhaler   Patient is also asking for a nebulizer machine with solution. Patient states she tested positive for Covid-19 yesterday and is having trouble breath (SOB). Please prescribe nebulizer if appropriate.   Patient uses   [place] ([place]), [place] - [address]   Please advice [phone]

## 2019-06-06 ENCOUNTER — Telehealth: Payer: Self-pay | Admitting: Physician Assistant

## 2019-06-06 ENCOUNTER — Encounter: Payer: Self-pay | Admitting: Physician Assistant

## 2019-06-06 NOTE — Telephone Encounter (Signed)
Called to discuss with [name] about Covid symptoms and the use of bamlanivimab/etesevimab or casirivimab/imdevimab, a monoclonal antibody infusion for those with mild to moderate Covid symptoms and at a high risk of hospitalization.     Pt is qualified for this infusion at the [facility] infusion center due to co-morbid conditions and/or a member of an at-risk group, however declines infusion at this time. Symptoms tier reviewed as well as criteria for ending isolation.  Symptoms reviewed that would warrant ED/Hospital evaluation. Preventative practices reviewed. Patient verbalized understanding. Patient advised to call back if he decides that he does want to get infusion. Callback number to the infusion center given. Patient advised to go to Urgent care or ED with severe symptoms. Last date pt would be eligible for infusion is 06/06/19.        Patient Active Problem List   Diagnosis Date Noted  . Morbid obesity ([place])   . Recurrent UTI 03/21/2019  . Chronic vaginitis 03/21/2019  . Mixed incontinence 03/21/2019  . Incomplete bladder emptying 03/21/2019  . Screening breast examination 02/11/2019  . Constipation 08/14/2018  . Fatigue 08/14/2018  . Insomnia 08/14/2018  . Vaginal discharge 08/14/2018  . Hospital discharge follow-up 08/14/2018  . 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 OBESITY, NOS 04/19/2006  . BLEEDING, RECTAL 04/19/2006    Angelena Form PA-C

## 2019-07-01 ENCOUNTER — Ambulatory Visit (INDEPENDENT_AMBULATORY_CARE_PROVIDER_SITE_OTHER): Payer: Self-pay | Admitting: Primary Care

## 2019-07-01 ENCOUNTER — Other Ambulatory Visit: Payer: Self-pay

## 2019-07-01 ENCOUNTER — Encounter (INDEPENDENT_AMBULATORY_CARE_PROVIDER_SITE_OTHER): Payer: Self-pay | Admitting: Primary Care

## 2019-07-01 VITALS — BP 128/83 | HR 67 | Temp 97.2°F | Resp 16 | Ht 65.5 in | Wt 204.0 lb

## 2019-07-01 DIAGNOSIS — R350 Frequency of micturition: Secondary | ICD-10-CM

## 2019-07-01 DIAGNOSIS — U071 COVID-19: Secondary | ICD-10-CM

## 2019-07-01 LAB — POCT URINALYSIS DIP (CLINITEK)
Bilirubin, UA: NEGATIVE
Glucose, UA: NEGATIVE mg/dL
Ketones, POC UA: NEGATIVE mg/dL
Leukocytes, UA: NEGATIVE
Nitrite, UA: NEGATIVE
POC PROTEIN,UA: NEGATIVE
Spec Grav, UA: 1.01 (ref 1.010–1.025)
Urobilinogen, UA: 0.2 E.U./dL
pH, UA: 6.5 (ref 5.0–8.0)

## 2019-07-01 NOTE — Patient Instructions (Signed)

## 2019-07-01 NOTE — Progress Notes (Signed)
Cough, SOB, feels like something is stuck in chest.   Had COVID -19 05/2019

## 2019-07-01 NOTE — Progress Notes (Signed)
Acute Office Visit  Subjective:    Patient ID: Michele Trevino, female    DOB: Sep 21, 1975, 44 y.o.   MRN: [id number]  Chief Complaint  Patient presents with  . Follow-up    HPI  Michele Trevino is a 44 year old African American female who presents today with 2 different set of symptoms. First she has right sided flank pain tender CVA tenderness, burning with urination. Original concern was she had COVID April 14,2021. She continues to have shortness of breath, dry hacking cough with no relief interferes with rest and activities.   Past Medical History:  Diagnosis Date  . Arthritis   . Carpal tunnel syndrome   . Morbid obesity ([place])     Past Surgical History:  Procedure Laterality Date  . CESAREAN SECTION    . TUBAL LIGATION      Family History  Problem Relation Age of Onset  . Throat cancer Mother   . Diabetes Father   . Hypertension Father     Social History   Socioeconomic History  . Marital status: Married    Spouse name: Not on file  . Number of children: Not on file  . Years of education: Not on file  . Highest education level: Some college, no degree  Occupational History  . Not on file  Tobacco Use  . Smoking status: Former Smoker    Quit date: 2005    Years since quitting: 16.3  . Smokeless tobacco: Never Used  Substance and Sexual Activity  . Alcohol use: Yes    Comment: socially   . Drug use: No  . Sexual activity: Yes    Birth control/protection: Surgical  Other Topics Concern  . Not on file  Social History Narrative  . Not on file   Social Determinants of Health   Financial Resource Strain:   . Difficulty of Paying Living Expenses:   Food Insecurity:   . Worried About Charity fundraiser in the Last Year:   . Arboriculturist in the Last Year:   Transportation Needs: No Transportation Needs  . Lack of Transportation (Medical): No  . Lack of Transportation (Non-Medical): No  Physical Activity:   . Days of Exercise  per Week:   . Minutes of Exercise per Session:   Stress:   . Feeling of Stress :   Social Connections:   . Frequency of Communication with Friends and Family:   . Frequency of Social Gatherings with Friends and Family:   . Attends Religious Services:   . Active Member of Clubs or Organizations:   . Attends Archivist Meetings:   Marland Kitchen Marital Status:   Intimate Partner Violence:   . Fear of Current or Ex-Partner:   . Emotionally Abused:   Marland Kitchen Physically Abused:   . Sexually Abused:     Outpatient Medications Prior to Visit  Medication Sig Dispense Refill  . albuterol (VENTOLIN HFA) 108 (90 Base) MCG/ACT inhaler Inhale 1-2 puffs into the lungs every 6 (six) hours as needed for wheezing or shortness of breath. 8 g 1  . loratadine (CLARITIN) 10 MG tablet Take 1 tablet (10 mg total) by mouth daily. 30 tablet 11  . amitriptyline (ELAVIL) 10 MG tablet Take 1 tablet (10 mg total) by mouth at bedtime. (Patient not taking: Reported on 07/01/2019) 60 tablet 0  . cetirizine (ZYRTEC) 10 MG tablet Take 10 mg by mouth daily as needed for allergies.    . fluticasone (FLONASE) 50  MCG/ACT nasal spray Place 2 sprays into both nostrils daily. (Patient not taking: Reported on 07/01/2019) 16 g 6  . ibuprofen (ADVIL) 600 MG tablet Take 1 tablet (600 mg total) by mouth every 6 (six) hours as needed. (Patient not taking: Reported on 07/01/2019) 30 tablet 0  . metroNIDAZOLE (FLAGYL) 500 MG tablet Take 1 tablet (500 mg total) by mouth 2 (two) times daily. (Patient not taking: Reported on 07/01/2019) 14 tablet 0   No facility-administered medications prior to visit.    Allergies  Allergen Reactions  . Latex Swelling    Review of Systems  Respiratory: Positive for cough, chest tightness and shortness of breath.        Moves air freely  Psychiatric/Behavioral: Positive for sleep disturbance.  All other systems reviewed and are negative.      Objective:    Physical Exam Vitals reviewed.   Constitutional:      Appearance: She is obese.  HENT:     Head: Normocephalic.  Cardiovascular:     Rate and Rhythm: Normal rate and regular rhythm.  Pulmonary:     Effort: Pulmonary effort is normal.     Breath sounds: Normal breath sounds.  Abdominal:     General: Bowel sounds are normal.  Musculoskeletal:        General: Normal range of motion.     Cervical back: Normal range of motion and neck supple.  Skin:    General: Skin is warm and dry.  Neurological:     Mental Status: She is alert and oriented to person, place, and time.  Psychiatric:        Mood and Affect: Mood normal.        Behavior: Behavior normal.        Thought Content: Thought content normal.        Judgment: Judgment normal.     BP 128/83   Pulse 67   Temp (!) 97.2 F (36.2 C)   Resp 16   Ht 5' 5.5" (1.664 m)   Wt 204 lb (92.5 kg)   LMP 05/31/2019   SpO2 99%   BMI 33.43 kg/m  Wt Readings from Last 3 Encounters:  07/01/19 204 lb (92.5 kg)  02/11/19 209 lb (94.8 kg)  01/14/19 208 lb 4.8 oz (94.5 kg)    Health Maintenance Due  Topic Date Due  . COVID-19 Vaccine (1) Never done    There are no preventive care reminders to display for this patient.   Lab Results  Component Value Date   TSH 2.090 08/14/2018   Lab Results  Component Value Date   WBC 4.7 06/04/2019   HGB 10.1 (L) 06/04/2019   HCT 33.9 (L) 06/04/2019   MCV 77.2 (L) 06/04/2019   PLT 272 06/04/2019   Lab Results  Component Value Date   NA 137 06/04/2019   K 4.0 06/04/2019   CO2 23 06/04/2019   GLUCOSE 107 (H) 06/04/2019   BUN <5 (L) 06/04/2019   CREATININE 1.06 (H) 06/04/2019   BILITOT 0.2 (L) 12/01/2018   ALKPHOS 44 12/01/2018   AST 22 12/01/2018   ALT 16 12/01/2018   PROT 6.9 12/01/2018   ALBUMIN 3.5 12/01/2018   CALCIUM 8.6 (L) 06/04/2019   ANIONGAP 11 06/04/2019   Lab Results  Component Value Date   CHOL 172 12/28/2017   Lab Results  Component Value Date   HDL 52 12/28/2017   Lab Results   Component Value Date   LDLCALC 106 (H) 12/28/2017   Lab  Results  Component Value Date   TRIG 69 12/28/2017   Lab Results  Component Value Date   CHOLHDL 3.3 12/28/2017   Lab Results  Component Value Date   HGBA1C 5.9 (A) 08/14/2018       Assessment & Plan:  Krystyl was seen today for follow-up.  Diagnoses and all orders for this visit:  Urinary frequency Negative increase water 64 oz daily decrease sodas. -     POCT URINALYSIS DIP (CLINITEK)  COVID-19 History of each patient has different or similar problems , albuterol inhaler does not provide relief feels chest tightness with shortness of breath, cough non stop dry and hacking. Spoke with Dr. Joya Gaskins he has agreed to evaluate her tomorrow at 11:00    No orders of the defined types were placed in this encounter.    Kerin Perna, NP

## 2019-07-02 ENCOUNTER — Other Ambulatory Visit: Payer: Self-pay

## 2019-07-02 ENCOUNTER — Encounter: Payer: Self-pay | Admitting: Critical Care Medicine

## 2019-07-02 ENCOUNTER — Ambulatory Visit: Payer: Self-pay | Attending: Critical Care Medicine | Admitting: Critical Care Medicine

## 2019-07-02 VITALS — BP 136/89 | HR 72 | Temp 97.6°F | Resp 18 | Ht 61.0 in | Wt 204.0 lb

## 2019-07-02 DIAGNOSIS — J219 Acute bronchiolitis, unspecified: Secondary | ICD-10-CM | POA: Insufficient documentation

## 2019-07-02 DIAGNOSIS — J0100 Acute maxillary sinusitis, unspecified: Secondary | ICD-10-CM | POA: Insufficient documentation

## 2019-07-02 DIAGNOSIS — F419 Anxiety disorder, unspecified: Secondary | ICD-10-CM

## 2019-07-02 DIAGNOSIS — Z8616 Personal history of COVID-19: Secondary | ICD-10-CM | POA: Insufficient documentation

## 2019-07-02 MED ORDER — AZITHROMYCIN 250 MG PO TABS: ORAL_TABLET | ORAL | 0 refills | Status: DC

## 2019-07-02 MED ORDER — METHYLPREDNISOLONE SODIUM SUCC 125 MG IJ SOLR
125.0000 mg | Freq: Once | INTRAMUSCULAR | Status: AC
  Administered 2019-07-02: 12:00:00 125 mg via INTRAMUSCULAR

## 2019-07-02 MED ORDER — HYDROCOD POLST-CPM POLST ER 10-8 MG/5ML PO SUER: ORAL | 0 refills | Status: DC

## 2019-07-02 MED ORDER — ALBUTEROL SULFATE (2.5 MG/3ML) 0.083% IN NEBU: 2.5000 mg | INHALATION_SOLUTION | Freq: Four times a day (QID) | RESPIRATORY_TRACT | 12 refills | Status: AC | PRN

## 2019-07-02 MED ORDER — BENZONATATE 200 MG PO CAPS: ORAL_CAPSULE | ORAL | 0 refills | Status: DC

## 2019-07-02 MED ORDER — OMEPRAZOLE 20 MG PO CPDR: 20.0000 mg | DELAYED_RELEASE_CAPSULE | Freq: Every day | ORAL | 4 refills | Status: DC

## 2019-07-02 NOTE — Progress Notes (Signed)
Subjective:    Patient ID: Michele Trevino, female    DOB: 08-22-1975, 44 y.o.   MRN: [id number]  This is a 44 year old female with previous history of diagnosed Covid infection with positive test on June 02, 2019.  The patient had symptoms of a week prior to this.  Symptoms include loss of taste and smell, diarrhea, cough which was dry to moderately productive of clear yellow mucus, shortness of breath headaches and dizziness without fever.  The patient did not really resolve all of her symptoms and is persisted over the last month with cough wheezing shortness of breath heartburn belching burping.  Diarrhea has improved.  She is taking Flonase and Claritin over-the-counter with increased allergic symptoms and this is helped to some degree.  She still has issues with taste and smell.  She is not received an x-ray.  She is not receive steroids or antibiotics at this time.  Patient is referred by primary care for further evaluation  Past Medical History:  Diagnosis Date  . Arthritis   . Carpal tunnel syndrome   . Morbid obesity ([place])      Family History  Problem Relation Age of Onset  . Throat cancer Mother   . Diabetes Father   . Hypertension Father      Social History   Socioeconomic History  . Marital status: Married    Spouse name: Not on file  . Number of children: Not on file  . Years of education: Not on file  . Highest education level: Some college, no degree  Occupational History  . Not on file  Tobacco Use  . Smoking status: Former Smoker    Quit date: 2005    Years since quitting: 16.3  . Smokeless tobacco: Never Used  Substance and Sexual Activity  . Alcohol use: Yes    Comment: socially   . Drug use: No  . Sexual activity: Yes    Birth control/protection: Surgical  Other Topics Concern  . Not on file  Social History Narrative  . Not on file   Social Determinants of Health   Financial Resource Strain:   . Difficulty of Paying Living Expenses:     Food Insecurity:   . Worried About Charity fundraiser in the Last Year:   . Arboriculturist in the Last Year:   Transportation Needs: No Transportation Needs  . Lack of Transportation (Medical): No  . Lack of Transportation (Non-Medical): No  Physical Activity:   . Days of Exercise per Week:   . Minutes of Exercise per Session:   Stress:   . Feeling of Stress :   Social Connections:   . Frequency of Communication with Friends and Family:   . Frequency of Social Gatherings with Friends and Family:   . Attends Religious Services:   . Active Member of Clubs or Organizations:   . Attends Archivist Meetings:   Marland Kitchen Marital Status:   Intimate Partner Violence:   . Fear of Current or Ex-Partner:   . Emotionally Abused:   Marland Kitchen Physically Abused:   . Sexually Abused:      Allergies  Allergen Reactions  . Latex Swelling     Outpatient Medications Prior to Visit  Medication Sig Dispense Refill  . albuterol (VENTOLIN HFA) 108 (90 Base) MCG/ACT inhaler Inhale 1-2 puffs into the lungs every 6 (six) hours as needed for wheezing or shortness of breath. 8 g 1  . fluticasone (FLONASE) 50 MCG/ACT nasal spray Place  2 sprays into both nostrils daily. 16 g 6  . loratadine (CLARITIN) 10 MG tablet Take 1 tablet (10 mg total) by mouth daily. 30 tablet 11  . ibuprofen (ADVIL) 600 MG tablet Take 1 tablet (600 mg total) by mouth every 6 (six) hours as needed. (Patient not taking: Reported on 07/01/2019) 30 tablet 0  . amitriptyline (ELAVIL) 10 MG tablet Take 1 tablet (10 mg total) by mouth at bedtime. (Patient not taking: Reported on 07/01/2019) 60 tablet 0  . cetirizine (ZYRTEC) 10 MG tablet Take 10 mg by mouth daily as needed for allergies.    . metroNIDAZOLE (FLAGYL) 500 MG tablet Take 1 tablet (500 mg total) by mouth 2 (two) times daily. (Patient not taking: Reported on 07/01/2019) 14 tablet 0   No facility-administered medications prior to visit.       Review of Systems  Constitutional:  Positive for activity change, appetite change and fatigue. Negative for chills, diaphoresis and fever.  HENT: Positive for congestion, postnasal drip, rhinorrhea, sinus pressure, sinus pain, sneezing, sore throat, trouble swallowing and voice change.   Eyes: Negative.   Respiratory: Positive for cough, choking, chest tightness, shortness of breath and wheezing.   Cardiovascular: Positive for chest pain. Negative for palpitations and leg swelling.  Gastrointestinal:       Jerrye Bushy  Endocrine: Negative.   Genitourinary: Negative.   Musculoskeletal: Negative.   Allergic/Immunologic: Negative.   Neurological: Negative.   Psychiatric/Behavioral: Positive for dysphoric mood. The patient is nervous/anxious.        Objective:   Physical Exam . Vitals:   07/02/19 1059  BP: 136/89  Pulse: 72  Resp: 18  Temp: 97.6 F (36.4 C)  SpO2: 100%  Weight: 204 lb (92.5 kg)  Height: 5\' 1"  (1.549 m)    Gen: Pleasant, obese ,in no distress, anxious affect, incessant clearing of throat during interview  ENT: Right nasal purulence,  mouth clear,  oropharynx clear, 3+postnasal drip  Neck: No JVD, no TMG, no carotid bruits  Lungs: No use of accessory muscles, no dullness to percussion, expired wheezes Cardiovascular: RRR, heart sounds normal, no murmur or gallops, no peripheral edema  Abdomen: soft and NT, no HSM,  BS normal  Musculoskeletal: No deformities, no cyanosis or clubbing  Neuro: alert, non focal  Skin: Warm, no lesions or rashes      Assessment & Plan:  I personally reviewed all images and lab data in the [facility] system as well as any outside material available during this office visit and agree with the  radiology impressions.   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 secondary to COVID-19 infection with associated maxillary sinusitis  Plan is to administer azithromycin for 5 days, administer Solu-Medrol 125 mg IM at this visit begin albuterol by nebulizer and administer a  nebulized device  Begin proton pump inhibitor and begin cough protocol starting with Tussionex twice daily for 48 hours and then switch to benzonatate twice daily on schedule, patient to use sugar-free candy drops to train self to swallow instead of clearing throat and coughing,  Acute non-recurrent maxillary sinusitis Continue Claritin Flonase and begin azithromycin for 5 days  Anxiety Severe anxiety syndrome with elevated GAD-7 score will refer to our clinical social worker for counseling   Diagnoses and all orders for this visit:  Bronchiolitis -     methylPREDNISolone sodium succinate (SOLU-MEDROL) 125 mg/2 mL injection 125 mg -     DG Chest 2 View; Future  Anxiety  History of COVID-19 -  DG Chest 2 View; Future  Acute non-recurrent maxillary sinusitis  Other orders -     albuterol (PROVENTIL) (2.5 MG/3ML) 0.083% nebulizer solution; Take 3 mLs (2.5 mg total) by nebulization every 6 (six) hours as needed for wheezing or shortness of breath. -     benzonatate (TESSALON) 200 MG capsule; Per cough protocol -     azithromycin (ZITHROMAX) 250 MG tablet; Take two once then one daily until gone -     chlorpheniramine-HYDROcodone (TUSSIONEX PENNKINETIC ER) 10-8 MG/5ML SUER; Per cough protocol -     omeprazole (PRILOSEC) 20 MG capsule; Take 1 capsule (20 mg total) by mouth daily.   We will also add proton pump inhibitor once daily for acid suppression with cyclic cough

## 2019-07-02 NOTE — Patient Instructions (Addendum)
Begin azithromycin take 2 the first day then 1 daily till gone  A Solu-Medrol steroid injection was given at this visit  Begin Tussionex take a tablespoon twice daily on schedule for the next 48 hours, this will make you drowsy and will suppress your cough so please stay at home, stay well-hydrated during this time.  After 48 hours begin benzonatate take 1 twice daily for cough suppression  Begin omeprazole daily for period of 2 weeks to reduce acid take this before a meal and eat  Obtain a bag of sugar-free candy such as jelly ranchers to keep in the mouth at all times to strain yourself to swallow instead of clearing your throat and coughing  You may use albuterol nebulizer 2-3 times daily as needed for shortness of breath the nebulizer was issued and medicine for this was sent to your pharmacy  Continue to take the Claritin and Flonase daily  A chest x-ray will be obtained  Return to see Dr. Joya Gaskins in 2 weeks with a video visit   You score high on depression and anxiety scale , we will connect you with Delana Meyer our clinical social worker for counseling

## 2019-07-02 NOTE — Assessment & Plan Note (Signed)
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

## 2019-07-02 NOTE — Assessment & Plan Note (Signed)
Continue Claritin Flonase and begin azithromycin for 5 days

## 2019-07-02 NOTE — Progress Notes (Signed)
Here to f /u on her SOB and feeling like something stuck on her throat post CoVid Made MD aware of depression and anxiety screnings

## 2019-07-02 NOTE — Assessment & Plan Note (Signed)
Severe anxiety syndrome with elevated GAD-7 score will refer to our clinical social worker for counseling

## 2019-07-03 ENCOUNTER — Telehealth: Payer: Self-pay

## 2019-07-03 NOTE — Telephone Encounter (Signed)
PT CALL: stating pharmacy [facility]. needs directions on two medications before they can fill.  [place] 10-8 MG/5ML SURE  Pt phone [phone]

## 2019-07-04 ENCOUNTER — Other Ambulatory Visit: Payer: Self-pay

## 2019-07-04 ENCOUNTER — Ambulatory Visit (HOSPITAL_COMMUNITY)
Admission: RE | Admit: 2019-07-04 | Discharge: 2019-07-04 | Disposition: A | Discharge status: Home / Self Care | Payer: No Typology Code available for payment source | Source: Ambulatory Visit | Attending: Critical Care Medicine | Admitting: Critical Care Medicine

## 2019-07-04 DIAGNOSIS — Z8616 Personal history of COVID-19: Secondary | ICD-10-CM | POA: Insufficient documentation

## 2019-07-04 DIAGNOSIS — J219 Acute bronchiolitis, unspecified: Secondary | ICD-10-CM | POA: Insufficient documentation

## 2019-07-04 MED ORDER — HYDROCOD POLST-CPM POLST ER 10-8 MG/5ML PO SUER: ORAL | 0 refills | Status: DC

## 2019-07-04 MED ORDER — BENZONATATE 200 MG PO CAPS: ORAL_CAPSULE | ORAL | 0 refills | Status: DC

## 2019-07-04 NOTE — Telephone Encounter (Signed)
Clarification sent

## 2019-07-09 ENCOUNTER — Telehealth (INDEPENDENT_AMBULATORY_CARE_PROVIDER_SITE_OTHER): Payer: Self-pay

## 2019-07-09 NOTE — Telephone Encounter (Signed)
Patient called stating she has developed leg cramps which started Sunday. Patient states she thought it was because she was moving heavy items into her new house but she is now having them constant which makes it difficult for her to walk. Patient will like to know if PCP can send a RX for the muscle spasm or if she could advice her what to do to help control them.  Please advice [phone]

## 2019-07-09 NOTE — Telephone Encounter (Signed)
Patient needs an appointment to evaluate leg cramps and need for muscle relaxer. Please schedule.

## 2019-07-11 NOTE — Telephone Encounter (Signed)
Patient scheduled an appointment for June 1 @ 10:50 am.

## 2019-07-13 ENCOUNTER — Other Ambulatory Visit: Payer: Self-pay

## 2019-07-13 ENCOUNTER — Encounter: Payer: Self-pay | Admitting: Emergency Medicine

## 2019-07-13 ENCOUNTER — Ambulatory Visit
Admission: EM | Admit: 2019-07-13 | Discharge: 2019-07-13 | Disposition: A | Discharge status: Home / Self Care | Payer: No Typology Code available for payment source | Attending: Emergency Medicine | Admitting: Emergency Medicine

## 2019-07-13 DIAGNOSIS — S161XXA Strain of muscle, fascia and tendon at neck level, initial encounter: Secondary | ICD-10-CM

## 2019-07-13 DIAGNOSIS — M62838 Other muscle spasm: Secondary | ICD-10-CM

## 2019-07-13 MED ORDER — METHYLPREDNISOLONE SODIUM SUCC 125 MG IJ SOLR
80.0000 mg | Freq: Once | INTRAMUSCULAR | Status: AC
  Administered 2019-07-13: 80 mg via INTRAMUSCULAR

## 2019-07-13 MED ORDER — CYCLOBENZAPRINE HCL 5 MG PO TABS
5.0000 mg | ORAL_TABLET | Freq: Two times a day (BID) | ORAL | 0 refills | Status: AC | PRN
Start: 2019-07-13 — End: 2019-07-18

## 2019-07-13 NOTE — Discharge Instructions (Addendum)
Recommend RICE: rest, ice, compression, elevation as needed for pain.    Heat therapy (hot compress, warm wash rag, hot showers, etc.) can help relax muscles and soothe muscle aches. Cold therapy (ice packs) can be used to help swelling both after injury and after prolonged use of areas of chronic pain/aches.  For pain: recommend 350 mg-1000 mg of Tylenol (acetaminophen) and/or 200 mg - 800 mg of Advil (ibuprofen, Motrin) every 8 hours as needed.  May alternate between the two throughout the day as they are generally safe to take together.  DO NOT exceed more than 3000 mg of Tylenol or 3200 mg of ibuprofen in a 24 hour period as this could damage your stomach, kidneys, liver, or increase your bleeding risk.  May take muscle relaxer as needed for severe pain / spasm.  (This medication may cause you to become tired so it is important you do not drink alcohol or operate heavy machinery while on this medication.  Recommend your first dose to be taken before bedtime to monitor for side effects safely)  Return for worsening cramps, spasms, difficulty walking, fever.

## 2019-07-13 NOTE — ED Triage Notes (Signed)
Discharge time extended offering reassurance

## 2019-07-13 NOTE — ED Provider Notes (Signed)
[name]    CSN: [id number] Arrival date & time: 07/13/19  0807      History   Chief Complaint Chief Complaint  Patient presents with  . Spasms    HPI Michele Trevino is a 44 y.o. female with history of obesity, arthritis presenting for muscle spasms.  States she was moving light objects repeatedly throughout the day Friday and Saturday she was moving.  Denies inciting event, fall.  States over the next few days she developed spasms.  States initially started in the back or legs, though has since spread throughout her cervical spine, back.  Denies fever, nuchal rigidity, nausea, vomiting, fever.  No chest pain, difficulty breathing.  Denies recent change in medications.  Has tried ibuprofen without relief.  Did schedule appointment on 6/1 with her PCP: Intends to keep this.  Patient does admit to single episode this morning of saddle area anesthesia: No urinary retention, fecal incontinence, lower extremity weakness.   Past Medical History:  Diagnosis Date  . Arthritis   . Carpal tunnel syndrome   . Morbid obesity [facility])     Patient Active Problem List   Diagnosis Date Noted  . History of COVID-19 07/02/2019  . Anxiety 07/02/2019  . Bronchiolitis 07/02/2019  . Acute non-recurrent maxillary sinusitis 07/02/2019  . Morbid obesity ([place])   . Mixed incontinence 03/21/2019  . UTERINE FIBROID 04/19/2006    Past Surgical History:  Procedure Laterality Date  . CESAREAN SECTION    . TUBAL LIGATION      OB History    Gravida  8   Para  4   Term      Preterm      AB  4   Living  4     SAB      TAB      Ectopic      Multiple      Live Births  4            Home Medications    Prior to Admission medications   Medication Sig Start Date End Date Taking? Authorizing Provider  ibuprofen (ADVIL) 600 MG tablet Take 1 tablet (600 mg total) by mouth every 6 (six) hours as needed. 12/01/18  Yes Jacqlyn Larsen, PA-C  loratadine (CLARITIN)  10 MG tablet Take 1 tablet (10 mg total) by mouth daily. 06/25/18  Yes Kerin Perna, NP  albuterol (PROVENTIL) (2.5 MG/3ML) 0.083% nebulizer solution Take 3 mLs (2.5 mg total) by nebulization every 6 (six) hours as needed for wheezing or shortness of breath. 07/02/19 07/01/20  Elsie Stain, MD  albuterol (VENTOLIN HFA) 108 (90 Base) MCG/ACT inhaler Inhale 1-2 puffs into the lungs every 6 (six) hours as needed for wheezing or shortness of breath. 06/05/19   Kerin Perna, NP  benzonatate (TESSALON) 200 MG capsule Per cough protocol, take 1 cap po BID. Start after taking Tussionex for 48 hours. 07/04/19   Elsie Stain, MD  chlorpheniramine-HYDROcodone ([place] ER) 10-8 MG/5ML SUER Per cough protocol. Take 5 ml BID for 48 hours, then stop and begin Tessalon pearls. 07/04/19   Elsie Stain, MD  cyclobenzaprine (FLEXERIL) 5 MG tablet Take 1 tablet (5 mg total) by mouth 2 (two) times daily as needed for up to 5 days for muscle spasms. 07/13/19 07/18/19  Hall-Potvin, [place], PA-C  fluticasone (FLONASE) 50 MCG/ACT nasal spray Place 2 sprays into both nostrils daily. 06/25/18   Kerin Perna, NP  omeprazole (PRILOSEC) 20 MG capsule  Take 1 capsule (20 mg total) by mouth daily. 07/02/19   Elsie Stain, MD    Family History Family History  Problem Relation Age of Onset  . Throat cancer Mother   . Diabetes Father   . Hypertension Father     Social History Social History   Tobacco Use  . Smoking status: Former Smoker    Quit date: 2005    Years since quitting: 16.4  . Smokeless tobacco: Never Used  Substance Use Topics  . Alcohol use: Yes    Comment: socially   . Drug use: No     Allergies   Latex   Review of Systems As per HPI   Physical Exam Triage Vital Signs ED Triage Vitals  Enc Vitals Group     BP      Pulse      Resp      Temp      Temp src      SpO2      Weight      Height      Head Circumference      Peak Flow      Pain  Score      Pain Loc      Pain Edu?      Excl. in [place]?    No data found.  Updated Vital Signs BP 126/83 (BP Location: Left Arm)   Pulse 74   Temp 98.4 F (36.9 C) (Oral)   Resp 20   LMP 07/06/2019   SpO2 97%   Visual Acuity Right Eye Distance:   Left Eye Distance:   Bilateral Distance:    Right Eye Near:   Left Eye Near:    Bilateral Near:     Physical Exam Constitutional:      General: She is not in acute distress.    Appearance: She is obese. She is not ill-appearing.  HENT:     Head: Normocephalic and atraumatic.  Eyes:     General: No scleral icterus.    Pupils: Pupils are equal, round, and reactive to light.  Neck:     Comments: Decreased ROM in all directions secondary patient reported pain.  No hypertrophy, deformity, spasms appreciated.  Patient apprehensive to touch Cardiovascular:     Rate and Rhythm: Normal rate and regular rhythm.  Pulmonary:     Effort: Pulmonary effort is normal. No respiratory distress.     Breath sounds: No wheezing.     Comments: Good air entry bilaterally Musculoskeletal:        General: Tenderness present. No swelling.     Right lower leg: No edema.     Left lower leg: No edema.     Comments: Exam limited second patient cooperation due to reported pain.  Lymphadenopathy:     Cervical: No cervical adenopathy.  Skin:    Capillary Refill: Capillary refill takes less than 2 seconds.     Coloration: Skin is not jaundiced or pale.     Findings: No rash.  Neurological:     General: No focal deficit present.     Mental Status: She is alert and oriented to person, place, and time.      UC Treatments / Results  Labs (all labs ordered are listed, but only abnormal results are displayed) Labs Reviewed - No data to display  EKG   Radiology No results found.  Procedures Procedures (including critical care time)  Medications Ordered in UC Medications  methylPREDNISolone sodium succinate (SOLU-MEDROL) 125 mg/2 mL  injection  80 mg (has no administration in time range)    Initial Impression / Assessment and Plan / UC Course  I have reviewed the triage vital signs and the nursing notes.  Pertinent labs & imaging results that were available during my care of the patient were reviewed by me and considered in my medical decision making (see chart for details).     Patient afebrile, nontoxic in office today.  Blood pressure at baseline, normal cardiac rate.  No deformity, appreciable swelling on exam.  Patient thinks she had brief episode of saddle area anesthesia earlier this morning.  No other alarm symptoms in history or on exam.  If Solu-Medrol in office which patient tolerated well.  Patient to follow-up with PCP 6/1 for further evaluation if needed: Will provide muscle relaxers in the interim.  Return precautions discussed, patient verbalized understanding and is agreeable to plan. Final Clinical Impressions(s) / UC Diagnoses   Final diagnoses:  Muscle spasms of both lower extremities  Cervical strain, acute, initial encounter     Discharge Instructions     Recommend RICE: rest, ice, compression, elevation as needed for pain.    Heat therapy (hot compress, warm wash rag, hot showers, etc.) can help relax muscles and soothe muscle aches. Cold therapy (ice packs) can be used to help swelling both after injury and after prolonged use of areas of chronic pain/aches.  For pain: recommend 350 mg-1000 mg of Tylenol (acetaminophen) and/or 200 mg - 800 mg of Advil (ibuprofen, Motrin) every 8 hours as needed.  May alternate between the two throughout the day as they are generally safe to take together.  DO NOT exceed more than 3000 mg of Tylenol or 3200 mg of ibuprofen in a 24 hour period as this could damage your stomach, kidneys, liver, or increase your bleeding risk.  May take muscle relaxer as needed for severe pain / spasm.  (This medication may cause you to become tired so it is important you do not drink alcohol  or operate heavy machinery while on this medication.  Recommend your first dose to be taken before bedtime to monitor for side effects safely)  Return for worsening cramps, spasms, difficulty walking, fever.    ED Prescriptions    Medication Sig Dispense Auth. Provider   cyclobenzaprine (FLEXERIL) 5 MG tablet Take 1 tablet (5 mg total) by mouth 2 (two) times daily as needed for up to 5 days for muscle spasms. 10 tablet Hall-Potvin, [place], PA-C     I have reviewed the PDMP during this encounter.   Hall-Potvin, [place], [place] 07/13/19 [phone]

## 2019-07-13 NOTE — ED Triage Notes (Signed)
Last week, patient moved from one location/home to another and picking up small items.  Pain starts at head, down neck, back and into legs.  Usually when lying down has head and neck pain, but legs continue to hurt throughout the day.  No one specific incident was painful.    Patient reports having covid at the beginning of April and provider did have her on prednisone, "breathing machine", and others she cannot recall the name.

## 2019-07-22 ENCOUNTER — Telehealth (INDEPENDENT_AMBULATORY_CARE_PROVIDER_SITE_OTHER): Payer: No Typology Code available for payment source | Admitting: Primary Care

## 2019-07-22 DIAGNOSIS — M62838 Other muscle spasm: Secondary | ICD-10-CM

## 2019-07-22 DIAGNOSIS — M545 Low back pain: Secondary | ICD-10-CM

## 2019-07-22 DIAGNOSIS — G8929 Other chronic pain: Secondary | ICD-10-CM

## 2019-07-22 MED ORDER — IBUPROFEN 600 MG PO TABS: 600.0000 mg | ORAL_TABLET | Freq: Three times a day (TID) | ORAL | 1 refills | Status: DC | PRN

## 2019-07-22 MED ORDER — CYCLOBENZAPRINE HCL 10 MG PO TABS: 10.0000 mg | ORAL_TABLET | Freq: Three times a day (TID) | ORAL | 0 refills | Status: DC | PRN

## 2019-07-22 NOTE — Progress Notes (Addendum)
Virtual Visit via Telephone Note  I connected with Michele Trevino on 07/22/19 at 10:50 AM EDT by telephone and verified that I am speaking with the correct person using two identifiers.   I discussed the limitations, risks, security and privacy concerns of performing an evaluation and management service by telephone and the availability of in person appointments. I also discussed with the patient that there may be a patient responsible charge related to this service. The patient expressed understanding and agreed to proceed. Patient is at home  Michele Trevino in the office at [facility]  History of Present Illness: Michele Trevino is a 44 year old female having a tele visit for complaints of bilateral leg pain and it is worst in the morning when getting out of bed-stiffness- trying to walk for exercise causing muscle spasm. Reviewed hospital labs K+ normal .She [place] able to check Bp at home reading were 134/73 today    Past Medical History:  Diagnosis Date  . Arthritis   . Carpal tunnel syndrome   . Morbid obesity ([place])    Current Outpatient Medications on File Prior to Visit  Medication Sig Dispense Refill  . albuterol (PROVENTIL) (2.5 MG/3ML) 0.083% nebulizer solution Take 3 mLs (2.5 mg total) by nebulization every 6 (six) hours as needed for wheezing or shortness of breath. 75 mL 12  . albuterol (VENTOLIN HFA) 108 (90 Base) MCG/ACT inhaler Inhale 1-2 puffs into the lungs every 6 (six) hours as needed for wheezing or shortness of breath. 8 g 1  . benzonatate (TESSALON) 200 MG capsule Per cough protocol, take 1 cap po BID. Start after taking Tussionex for 48 hours. 60 capsule 0  . chlorpheniramine-HYDROcodone (TUSSIONEX PENNKINETIC ER) 10-8 MG/5ML SUER Per cough protocol. Take 5 ml BID for 48 hours, then stop and begin Tessalon pearls. 140 mL 0  . fluticasone (FLONASE) 50 MCG/ACT nasal spray Place 2 sprays into both nostrils daily. 16 g 6  . loratadine  (CLARITIN) 10 MG tablet Take 1 tablet (10 mg total) by mouth daily. 30 tablet 11  . omeprazole (PRILOSEC) 20 MG capsule Take 1 capsule (20 mg total) by mouth daily. 30 capsule 4   No current facility-administered medications on file prior to visit.   Observations/Objective:   Assessment and Plan: Diagnoses and all orders for this visit:  Muscle spasm of both lower legs -     cyclobenzaprine (FLEXERIL) 10 MG tablet; Take 1 tablet (10 mg total) by mouth 3 (three) times daily as needed for muscle spasms.  Chronic bilateral low back pain, unspecified whether sciatica present Work on losing weight to help reduce joint pain. May alternate with heat and ice application for pain relief. May also alternate with acetaminophen and prescription  Ibuprofen as prescribed pain relief. Other alternatives include massage, acupuncture and water aerobics.  You must stay active and avoid a sedentary lifestyle. -     ibuprofen (ADVIL) 600 MG tablet; Take 1 tablet (600 mg total) by mouth every 8 (eight) hours as needed.    Follow Up Instructions:    I discussed the assessment and treatment plan with the patient. The patient was provided an opportunity to ask questions and all were answered. The patient agreed with the plan and demonstrated an understanding of the instructions.   The patient was advised to call back or seek an in-person evaluation if the symptoms worsen or if the condition fails to improve as anticipated.  I provided 68minutes of non-face-to-face time during this  encounter.   Kerin Perna, NP

## 2019-07-28 ENCOUNTER — Other Ambulatory Visit: Payer: Self-pay

## 2019-07-28 ENCOUNTER — Ambulatory Visit: Payer: Medicaid Other | Attending: Critical Care Medicine | Admitting: Critical Care Medicine

## 2019-07-28 ENCOUNTER — Encounter: Payer: Self-pay | Admitting: Critical Care Medicine

## 2019-07-28 VITALS — BP 108/71 | HR 84 | Ht 61.0 in | Wt 203.2 lb

## 2019-07-28 DIAGNOSIS — J219 Acute bronchiolitis, unspecified: Secondary | ICD-10-CM

## 2019-07-28 DIAGNOSIS — Z8616 Personal history of COVID-19: Secondary | ICD-10-CM

## 2019-07-28 DIAGNOSIS — K219 Gastro-esophageal reflux disease without esophagitis: Secondary | ICD-10-CM

## 2019-07-28 NOTE — Progress Notes (Signed)
Subjective:    Patient ID: Michele Trevino, female    DOB: 1975-07-01, 44 y.o.   MRN: [id number]  This is a 44 year old female with previous history of diagnosed Covid infection with positive test on June 02, 2019.  The patient had symptoms of a week prior to this.  Symptoms include loss of taste and smell, diarrhea, cough which was dry to moderately productive of clear yellow mucus, shortness of breath headaches and dizziness without fever.  The patient did not really resolve all of her symptoms and is persisted over the last month with cough wheezing shortness of breath heartburn belching burping.  Diarrhea has improved.  She is taking Flonase and Claritin over-the-counter with increased allergic symptoms and this is helped to some degree.  She still has issues with taste and smell.  She is not received an x-ray.  She is not receive steroids or antibiotics at this time.  Patient is referred by primary care for further evaluation  07/28/2019 The patient seen in return follow-up and since the last visit is doing well with her breathing.  She had Covid related bronchiolitis and is now on bronchodilator therapy and shortness of breath and cough have nearly resolved.  She still complains of chronic pain in the lower back.  She has chronic low back spasms.  She is undergoing physical therapy and is in seeing an orthopedist for this.  She does have reflux symptoms that persist.  Overall her cough and respiratory status is improved from the previous visit   Past Medical History:  Diagnosis Date  . Arthritis   . Carpal tunnel syndrome   . Morbid obesity ([place])      Family History  Problem Relation Age of Onset  . Throat cancer Mother   . Diabetes Father   . Hypertension Father      Social History   Socioeconomic History  . Marital status: Married    Spouse name: Not on file  . Number of children: Not on file  . Years of education: Not on file  . Highest education level: Some  college, no degree  Occupational History  . Not on file  Tobacco Use  . Smoking status: Former Smoker    Quit date: 2005    Years since quitting: 16.4  . Smokeless tobacco: Never Used  Substance and Sexual Activity  . Alcohol use: Yes    Comment: socially   . Drug use: No  . Sexual activity: Yes    Birth control/protection: Surgical  Other Topics Concern  . Not on file  Social History Narrative  . Not on file   Social Determinants of Health   Financial Resource Strain:   . Difficulty of Paying Living Expenses:   Food Insecurity:   . Worried About Charity fundraiser in the Last Year:   . Arboriculturist in the Last Year:   Transportation Needs: No Transportation Needs  . Lack of Transportation (Medical): No  . Lack of Transportation (Non-Medical): No  Physical Activity:   . Days of Exercise per Week:   . Minutes of Exercise per Session:   Stress:   . Feeling of Stress :   Social Connections:   . Frequency of Communication with Friends and Family:   . Frequency of Social Gatherings with Friends and Family:   . Attends Religious Services:   . Active Member of Clubs or Organizations:   . Attends Archivist Meetings:   Marland Kitchen Marital Status:  Intimate Partner Violence:   . Fear of Current or Ex-Partner:   . Emotionally Abused:   Marland Kitchen Physically Abused:   . Sexually Abused:      Allergies  Allergen Reactions  . Latex Swelling     Outpatient Medications Prior to Visit  Medication Sig Dispense Refill  . albuterol (PROVENTIL) (2.5 MG/3ML) 0.083% nebulizer solution Take 3 mLs (2.5 mg total) by nebulization every 6 (six) hours as needed for wheezing or shortness of breath. 75 mL 12  . albuterol (VENTOLIN HFA) 108 (90 Base) MCG/ACT inhaler Inhale 1-2 puffs into the lungs every 6 (six) hours as needed for wheezing or shortness of breath. 8 g 1  . chlorpheniramine-HYDROcodone (TUSSIONEX PENNKINETIC ER) 10-8 MG/5ML SUER Per cough protocol. Take 5 ml BID for 48 hours,  then stop and begin Tessalon pearls. 140 mL 0  . cyclobenzaprine (FLEXERIL) 10 MG tablet Take 1 tablet (10 mg total) by mouth 3 (three) times daily as needed for muscle spasms. 90 tablet 0  . fluticasone (FLONASE) 50 MCG/ACT nasal spray Place 2 sprays into both nostrils daily. 16 g 6  . ibuprofen (ADVIL) 600 MG tablet Take 1 tablet (600 mg total) by mouth every 8 (eight) hours as needed. 90 tablet 1  . loratadine (CLARITIN) 10 MG tablet Take 1 tablet (10 mg total) by mouth daily. 30 tablet 11  . omeprazole (PRILOSEC) 20 MG capsule Take 1 capsule (20 mg total) by mouth daily. 30 capsule 4  . benzonatate (TESSALON) 200 MG capsule Per cough protocol, take 1 cap po BID. Start after taking Tussionex for 48 hours. (Patient not taking: Reported on 07/28/2019) 60 capsule 0   No facility-administered medications prior to visit.       Review of Systems  Constitutional: Negative for activity change, appetite change, chills, diaphoresis, fatigue and fever.  HENT: Negative for congestion, postnasal drip, rhinorrhea, sinus pressure, sinus pain, sneezing, sore throat, trouble swallowing and voice change.   Eyes: Negative.   Respiratory: Negative for cough, choking, chest tightness, shortness of breath and wheezing.   Cardiovascular: Negative for chest pain, palpitations and leg swelling.  Gastrointestinal:       Jerrye Bushy  Endocrine: Negative.   Genitourinary: Negative.   Musculoskeletal: Positive for back pain.  Allergic/Immunologic: Negative.   Neurological: Negative.   Psychiatric/Behavioral: Negative for dysphoric mood. The patient is nervous/anxious.        Objective:   Physical Exam . Vitals:   07/28/19 0900  BP: 108/71  Pulse: 84  SpO2: 99%  Weight: 203 lb 3.2 oz (92.2 kg)  Height: 5\' 1"  (1.549 m)    Gen: Pleasant, obese ,in no distress, anxious affect, incessant clearing of throat during interview  ENT: Resolution of nasal purulence and postnasal drainage,  mouth clear,  oropharynx  clear,  Neck: No JVD, no TMG, no carotid bruits  Lungs: No use of accessory muscles, no dullness to percussion, improved breath sounds and resolution of wheezes   cardiovascular: RRR, heart sounds normal, no murmur or gallops, no peripheral edema  Abdomen: soft and NT, no HSM,  BS normal  Musculoskeletal: No deformities, no cyanosis or clubbing  Neuro: alert, non focal  Skin: Warm, no lesions or rashes      Assessment & Plan:  I personally reviewed all images and lab data in the [facility] system as well as any outside material available during this office visit and agree with the  radiology impressions.   GERD (gastroesophageal reflux disease) Reflux disease continues will continue proton pump  inhibitor therapy and give patient a reflux diet  Bronchiolitis Bronchiolitis status post Covid resolving will use Flonase and antihistamine for now benzonatate as needed albuterol as needed and discontinue further Tussionex   Kassadee was seen today for follow-up.  Diagnoses and all orders for this visit:  Bronchiolitis  Gastroesophageal reflux disease without esophagitis  History of COVID-19   I asked the patient to follow-up with her primary care provider on her back pain we will see her back in pulmonary clinic as needed

## 2019-07-28 NOTE — Assessment & Plan Note (Signed)
Reflux disease continues will continue proton pump inhibitor therapy and give patient a reflux diet

## 2019-07-28 NOTE — Progress Notes (Signed)
States that she is feeling better but still has the cough.  She has lower back pain that radiates down her legs.

## 2019-07-28 NOTE — Patient Instructions (Addendum)
Stop tussionex Use benzonatate as needed for cough Stay on flonase and allergy pill You can wean off omeprazole if you can follow diet below Follow up with Ms Oletta Lamas for your back pain  Return Dr Joya Gaskins as needed   Food Choices for Gastroesophageal Reflux Disease, Adult When you have gastroesophageal reflux disease (GERD), the foods you eat and your eating habits are very important. Choosing the right foods can help ease your discomfort. Think about working with a nutrition specialist (dietitian) to help you make good choices. What are tips for following this plan?  Meals  Choose healthy foods that are low in fat, such as fruits, vegetables, whole grains, low-fat dairy products, and lean meat, fish, and poultry.  Eat small meals often instead of 3 large meals a day. Eat your meals slowly, and in a place where you are relaxed. Avoid bending over or lying down until 2-3 hours after eating.  Avoid eating meals 2-3 hours before bed.  Avoid drinking a lot of liquid with meals.  Cook foods using methods other than frying. Bake, grill, or broil food instead.  Avoid or limit: ? Chocolate. ? Peppermint or spearmint. ? Alcohol. ? Pepper. ? Black and decaffeinated coffee. ? Black and decaffeinated tea. ? Bubbly (carbonated) soft drinks. ? Caffeinated energy drinks and soft drinks.  Limit high-fat foods such as: ? Fatty meat or fried foods. ? Whole milk, cream, butter, or ice cream. ? Nuts and nut butters. ? Pastries, donuts, and sweets made with butter or shortening.  Avoid foods that cause symptoms. These foods may be different for everyone. Common foods that cause symptoms include: ? Tomatoes. ? Oranges, lemons, and limes. ? Peppers. ? Spicy food. ? Onions and garlic. ? Vinegar. Lifestyle  Maintain a healthy weight. Ask your doctor what weight is healthy for you. If you need to lose weight, work with your doctor to do so safely.  Exercise for at least 30 minutes for 5 or  more days each week, or as told by your doctor.  Wear loose-fitting clothes.  Do not smoke. If you need help quitting, ask your doctor.  Sleep with the head of your bed higher than your feet. Use a wedge under the mattress or blocks under the bed frame to raise the head of the bed. Summary  When you have gastroesophageal reflux disease (GERD), food and lifestyle choices are very important in easing your symptoms.  Eat small meals often instead of 3 large meals a day. Eat your meals slowly, and in a place where you are relaxed.  Limit high-fat foods such as fatty meat or fried foods.  Avoid bending over or lying down until 2-3 hours after eating.  Avoid peppermint and spearmint, caffeine, alcohol, and chocolate. This information is not intended to replace advice given to you by your health care provider. Make sure you discuss any questions you have with your health care provider. Document Revised: 05/30/2018 Document Reviewed: 03/14/2016 Elsevier Patient Education  [place].

## 2019-07-28 NOTE — Assessment & Plan Note (Signed)
Bronchiolitis status post Covid resolving will use Flonase and antihistamine for now benzonatate as needed albuterol as needed and discontinue further Tussionex

## 2019-08-13 ENCOUNTER — Emergency Department (HOSPITAL_COMMUNITY)
Admission: EM | Admit: 2019-08-13 | Discharge: 2019-08-14 | Disposition: A | Discharge status: Home / Self Care | Payer: No Typology Code available for payment source | Attending: Emergency Medicine | Admitting: Emergency Medicine

## 2019-08-13 ENCOUNTER — Encounter (HOSPITAL_COMMUNITY): Payer: Self-pay | Admitting: Emergency Medicine

## 2019-08-13 ENCOUNTER — Other Ambulatory Visit: Payer: Self-pay

## 2019-08-13 DIAGNOSIS — R102 Pelvic and perineal pain: Secondary | ICD-10-CM | POA: Insufficient documentation

## 2019-08-13 DIAGNOSIS — G8929 Other chronic pain: Secondary | ICD-10-CM | POA: Insufficient documentation

## 2019-08-13 DIAGNOSIS — Z91040 Latex allergy status: Secondary | ICD-10-CM | POA: Insufficient documentation

## 2019-08-13 DIAGNOSIS — Z8616 Personal history of COVID-19: Secondary | ICD-10-CM | POA: Insufficient documentation

## 2019-08-13 DIAGNOSIS — B9689 Other specified bacterial agents as the cause of diseases classified elsewhere: Secondary | ICD-10-CM

## 2019-08-13 DIAGNOSIS — N760 Acute vaginitis: Secondary | ICD-10-CM | POA: Insufficient documentation

## 2019-08-13 DIAGNOSIS — J45909 Unspecified asthma, uncomplicated: Secondary | ICD-10-CM | POA: Insufficient documentation

## 2019-08-13 DIAGNOSIS — Z87891 Personal history of nicotine dependence: Secondary | ICD-10-CM | POA: Insufficient documentation

## 2019-08-13 HISTORY — DX: Unspecified asthma, uncomplicated: J45.909

## 2019-08-13 LAB — COMPREHENSIVE METABOLIC PANEL
ALT: 20 U/L (ref 0–44)
AST: 22 U/L (ref 15–41)
Albumin: 3.5 g/dL (ref 3.5–5.0)
Alkaline Phosphatase: 46 U/L (ref 38–126)
Anion gap: 9 (ref 5–15)
BUN: <5 mg/dL — ABNORMAL LOW (ref 6–20)
CO2: 24 mmol/L (ref 22–32)
Calcium: 9.1 mg/dL (ref 8.9–10.3)
Chloride: 107 mmol/L (ref 98–111)
Creatinine, Ser: 0.76 mg/dL (ref 0.44–1.00)
GFR calc Af Amer: >60 mL/min (ref >60)
GFR calc non Af Amer: >60 mL/min (ref >60)
Glucose, Bld: 102 mg/dL — ABNORMAL HIGH (ref 70–99)
Potassium: 4 mmol/L (ref 3.5–5.1)
Sodium: 140 mmol/L (ref 135–145)
Total Bilirubin: 0.5 mg/dL (ref 0.3–1.2)
Total Protein: 7 g/dL (ref 6.5–8.1)

## 2019-08-13 LAB — URINALYSIS, ROUTINE W REFLEX MICROSCOPIC
Bilirubin Urine: NEGATIVE
Glucose, UA: NEGATIVE mg/dL
Hgb urine dipstick: NEGATIVE
Ketones, ur: NEGATIVE mg/dL
Leukocytes,Ua: NEGATIVE
Nitrite: NEGATIVE
Protein, ur: NEGATIVE mg/dL
Specific Gravity, Urine: 1.012 (ref 1.005–1.030)
pH: 6 (ref 5.0–8.0)

## 2019-08-13 LAB — CBC WITH DIFFERENTIAL/PLATELET
Abs Immature Granulocytes: 0 10*3/uL (ref 0.00–0.07)
Basophils Absolute: 0.1 10*3/uL (ref 0.0–0.1)
Basophils Relative: 1 %
Eosinophils Absolute: 0 10*3/uL (ref 0.0–0.5)
Eosinophils Relative: 0 %
HCT: 31.9 % — ABNORMAL LOW (ref 36.0–46.0)
Hemoglobin: 9.6 g/dL — ABNORMAL LOW (ref 12.0–15.0)
Lymphocytes Relative: 41 %
Lymphs Abs: 3.5 10*3/uL (ref 0.7–4.0)
MCH: 23.7 pg — ABNORMAL LOW (ref 26.0–34.0)
MCHC: 30.1 g/dL (ref 30.0–36.0)
MCV: 78.8 fL — ABNORMAL LOW (ref 80.0–100.0)
Monocytes Absolute: 0.3 10*3/uL (ref 0.1–1.0)
Monocytes Relative: 4 %
Neutro Abs: 4.6 10*3/uL (ref 1.7–7.7)
Neutrophils Relative %: 54 %
Platelets: 606 10*3/uL — ABNORMAL HIGH (ref 150–400)
RBC: 4.05 MIL/uL (ref 3.87–5.11)
RDW: 17.2 % — ABNORMAL HIGH (ref 11.5–15.5)
WBC: 8.6 10*3/uL (ref 4.0–10.5)
nRBC: 0 % (ref 0.0–0.2)
nRBC: 0 /100 WBC

## 2019-08-13 LAB — I-STAT BETA HCG BLOOD, ED (MC, WL, AP ONLY): I-stat hCG, quantitative: <5 m[IU]/mL (ref <5)

## 2019-08-13 NOTE — ED Triage Notes (Signed)
Patient reports vaginal and rectal pain onset yesterday , denies injury , no dysuria, bleeding  or constipation .

## 2019-08-14 ENCOUNTER — Encounter (HOSPITAL_COMMUNITY): Payer: Self-pay | Admitting: Emergency Medicine

## 2019-08-14 LAB — WET PREP, GENITAL
Sperm: NONE SEEN
Trich, Wet Prep: NONE SEEN
Yeast Wet Prep HPF POC: NONE SEEN

## 2019-08-14 MED ORDER — AZITHROMYCIN 250 MG PO TABS
1000.0000 mg | ORAL_TABLET | Freq: Once | ORAL | Status: AC
  Administered 2019-08-14: 1000 mg via ORAL
  Filled 2019-08-14: qty 4

## 2019-08-14 MED ORDER — NAPROXEN 500 MG PO TABS
500.0000 mg | ORAL_TABLET | Freq: Two times a day (BID) | ORAL | 0 refills | Status: DC
Start: 2019-08-14 — End: 2019-09-17

## 2019-08-14 MED ORDER — OXYCODONE-ACETAMINOPHEN 5-325 MG PO TABS
1.0000 | ORAL_TABLET | Freq: Once | ORAL | Status: AC
  Administered 2019-08-14: 1 via ORAL
  Filled 2019-08-14: qty 1

## 2019-08-14 MED ORDER — NAPROXEN 250 MG PO TABS
500.0000 mg | ORAL_TABLET | Freq: Once | ORAL | Status: AC
  Administered 2019-08-14: 500 mg via ORAL
  Filled 2019-08-14: qty 2

## 2019-08-14 MED ORDER — DOXYCYCLINE HYCLATE 100 MG PO TABS
100.0000 mg | ORAL_TABLET | Freq: Once | ORAL | Status: AC
  Administered 2019-08-14: 100 mg via ORAL
  Filled 2019-08-14: qty 1

## 2019-08-14 MED ORDER — CEFTRIAXONE SODIUM 500 MG IJ SOLR
500.0000 mg | Freq: Once | INTRAMUSCULAR | Status: AC
  Administered 2019-08-14: 500 mg via INTRAMUSCULAR
  Filled 2019-08-14: qty 500

## 2019-08-14 MED ORDER — DOXYCYCLINE HYCLATE 100 MG PO CAPS
100.0000 mg | ORAL_CAPSULE | Freq: Two times a day (BID) | ORAL | 0 refills | Status: DC
Start: 2019-08-14 — End: 2019-08-29

## 2019-08-14 MED ORDER — METRONIDAZOLE 500 MG PO TABS
500.0000 mg | ORAL_TABLET | Freq: Two times a day (BID) | ORAL | 0 refills | Status: DC
Start: 2019-08-14 — End: 2019-08-29

## 2019-08-14 MED ORDER — LIDOCAINE HCL (PF) 1 % IJ SOLN
INTRAMUSCULAR | Status: AC
  Administered 2019-08-14: 5 mL
  Filled 2019-08-14: qty 5

## 2019-08-14 NOTE — Discharge Instructions (Signed)
You were seen in the ED for evaluation of ongoing pelvic pain, pain with intercourse  We discussed today your symptoms have been chronic for a few years.  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  Today you had normal lab work, normal urinalysis.  On pelvic/vaginal exam you had diffuse pain and discharge.  These findings can be from recurrent pelvic inflammatory disease.  Swabs showed bacterial vaginosis, you need to take Flagyl for this.  This is not an STD.  Take doxycycline 100 mg twice a day.  Use naproxen 500 mg twice a day for pain.  Please call your gynecologist and discuss your ongoing chronic pain.  Consider having a second opinion.  Return to the ER for worsening or changes in your pain, fever, urinary symptoms, bowel movement symptoms, vaginal bleeding

## 2019-08-14 NOTE — ED Provider Notes (Signed)
[facility] EMERGENCY DEPARTMENT Provider Note   CSN: [id number] Arrival date & time: 08/13/19  2041     History Chief Complaint  Patient presents with  . Vaginal/Rectal Pain    Michele Trevino is a 44 y.o. female presents to the ED for evaluation of pelvic pain.  This acutely worsened 2 days ago, states yesterday she could barely walk and lift her legs because this made the pain worse.  Patient points to her lower suprapubic, lower abdomen and inguinal creases.  The pain feels like a sharp knife in her pelvis.  The pain is worse with lifting her legs up, walking, moving.  Her pain is constant but much worse with movement and palpation.  Patient admits this pain has been going on since 2019 when she got married.  States prior to being married she had not been sexually active for 5 to 6 years.  Has associated dyspareunia, states it is difficult for her to be sexually active with her husband.  Has been seen by her gynecologist who referred her to a pelvic floor therapist.  States she has had an intravaginal pelvic floor evaluation and was told that her pelvic floors were "weak".  She was told to do pelvic tilts and Kegel exercises which she states felt like helped only a little bit but the pain continues to come back.  Has been seen in the ED 3-4 times for similar pain.  Has had several CT scans and pelvic ultrasounds.  States she keeps getting told she has recurrent UTIs or pelvic inflammatory disease.  Has tried turmeric and ginger for inflammation which help a little bit.  Reports chronic low back pain and muscle spasms that radiate down her leg.  Her doctor has told her that this pain is related to her obesity so she has been trying to change her diet and do more exercises.  She has been doing yoga for the last month but denies any recent specific movements that would have hurt her groin.  Reports chronic constipation issues, 4 days ago she had some castor oil to have a  bowel movement.  She was able to have a bowel movement and states her hemorrhoids are not irritated, tender.  No rectal bleeding, melena.  History of 1 vaginal birth and 3 C-sections.  No other abdominal surgeries.  LMP earlier this month.   Remote history of STD when she was much younger.  Sexually active with husband without condom use, she is not concerned about an STD.  Does not have an IUD.  No associated vaginal bleeding, vaginal discharge.  No associated nausea, vomiting, diarrhea.  Now having normal bowel movements without diarrhea or constipation.  No dysuria, hematuria or urinary frequency.    HPI     Past Medical History:  Diagnosis Date  . Arthritis   . Asthma   . Carpal tunnel syndrome   . Morbid obesity [facility])     Patient Active Problem List   Diagnosis Date Noted  . GERD (gastroesophageal reflux disease) 07/28/2019  . History of COVID-19 07/02/2019  . Anxiety 07/02/2019  . Bronchiolitis 07/02/2019  . Acute non-recurrent maxillary sinusitis 07/02/2019  . Morbid obesity ([place])   . Mixed incontinence 03/21/2019  . UTERINE FIBROID 04/19/2006    Past Surgical History:  Procedure Laterality Date  . CESAREAN SECTION    . TUBAL LIGATION       OB History    Gravida  8   Para  4   Term  Preterm      AB  4   Living  4     SAB      TAB      Ectopic      Multiple      Live Births  4           Family History  Problem Relation Age of Onset  . Throat cancer Mother   . Diabetes Father   . Hypertension Father     Social History   Tobacco Use  . Smoking status: Former Smoker    Quit date: 2005    Years since quitting: 16.4  . Smokeless tobacco: Never Used  Vaping Use  . Vaping Use: Never used  Substance Use Topics  . Alcohol use: Yes    Comment: socially   . Drug use: No    Home Medications Prior to Admission medications   Medication Sig Start Date End Date Taking? Authorizing Provider  albuterol (PROVENTIL) (2.5 MG/3ML) 0.083%  nebulizer solution Take 3 mLs (2.5 mg total) by nebulization every 6 (six) hours as needed for wheezing or shortness of breath. 07/02/19 07/01/20  Elsie Stain, MD  albuterol (VENTOLIN HFA) 108 (90 Base) MCG/ACT inhaler Inhale 1-2 puffs into the lungs every 6 (six) hours as needed for wheezing or shortness of breath. 06/05/19   Kerin Perna, NP  benzonatate (TESSALON) 200 MG capsule Per cough protocol, take 1 cap po BID. Start after taking Tussionex for 48 hours. Patient not taking: Reported on 07/28/2019 07/04/19   Elsie Stain, MD  chlorpheniramine-HYDROcodone ([place] ER) 10-8 MG/5ML SUER Per cough protocol. Take 5 ml BID for 48 hours, then stop and begin Tessalon pearls. 07/04/19   Elsie Stain, MD  cyclobenzaprine (FLEXERIL) 10 MG tablet Take 1 tablet (10 mg total) by mouth 3 (three) times daily as needed for muscle spasms. 07/22/19   Kerin Perna, NP  doxycycline (VIBRAMYCIN) 100 MG capsule Take 1 capsule (100 mg total) by mouth 2 (two) times daily. 08/14/19   Kinnie Feil, PA-C  fluticasone (FLONASE) 50 MCG/ACT nasal spray Place 2 sprays into both nostrils daily. 06/25/18   Kerin Perna, NP  ibuprofen (ADVIL) 600 MG tablet Take 1 tablet (600 mg total) by mouth every 8 (eight) hours as needed. 07/22/19   Kerin Perna, NP  loratadine (CLARITIN) 10 MG tablet Take 1 tablet (10 mg total) by mouth daily. 06/25/18   Kerin Perna, NP  metroNIDAZOLE (FLAGYL) 500 MG tablet Take 1 tablet (500 mg total) by mouth 2 (two) times daily. 08/14/19   Kinnie Feil, PA-C  naproxen (NAPROSYN) 500 MG tablet Take 1 tablet (500 mg total) by mouth 2 (two) times daily. 08/14/19   Kinnie Feil, PA-C  omeprazole (PRILOSEC) 20 MG capsule Take 1 capsule (20 mg total) by mouth daily. 07/02/19   Elsie Stain, MD    Allergies    Latex  Review of Systems   Review of Systems  Genitourinary: Positive for dyspareunia and pelvic pain.  Musculoskeletal:  Positive for gait problem.  All other systems reviewed and are negative.   Physical Exam Updated Vital Signs BP (!) 151/92 (BP Location: Right Arm)   Pulse 80   Temp 98.5 F (36.9 C) (Oral)   Resp 16   Ht 5\' 1"  (1.549 m)   Wt 100 kg   LMP 07/31/2019   SpO2 100%   BMI 41.66 kg/m   Physical Exam Vitals and nursing note reviewed.  Constitutional:      General: She is not in acute distress.    Appearance: She is well-developed.     Comments: NAD.  HENT:     Head: Normocephalic and atraumatic.     Right Ear: External ear normal.     Left Ear: External ear normal.     Nose: Nose normal.  Eyes:     General: No scleral icterus.    Conjunctiva/sclera: Conjunctivae normal.  Cardiovascular:     Rate and Rhythm: Normal rate and regular rhythm.     Heart sounds: Normal heart sounds. No murmur heard.   Pulmonary:     Effort: Pulmonary effort is normal.     Breath sounds: Normal breath sounds. No wheezing.  Abdominal:     Palpations: Abdomen is soft.     Tenderness: There is abdominal tenderness.       Comments: Obese abdomen. Diffuse mild very lower/pelvic tenderness. TTP in bilateral inguinal creases.  No guarding. No inguinal or abdominal bulging or obvious hernias.  Normal skin in inguinal creases, no break down  Genitourinary:      Comments: Exam performed with RN at bedside for assistance. Patient has diffuse tenderness in bilateral inguinal creases, external genitalia, introitus.  External genitalia without lesions, erythema or breakdown.  No groin lymphadenopathy.  Vaginal mucosa and cervix pink without lesions.  Moderate white discharge in vaginal vault obscuring cervix.  Discharge removed with long swab and cervix and cervical os visualized. Cervix and cervical os normal without lesions, friability. Patient with discomfort throughout entire pelvic exam, adnexal and cervical palpation.+CMT but observe to be mild.  Nonpalpable adnexa.  Small non tender non thrombosed rectal  hemorrhoid around 9 o'clock Musculoskeletal:        General: No deformity. Normal range of motion.     Cervical back: Normal range of motion and neck supple.     Lumbar back: Tenderness and bony tenderness present.     Comments: Diffuse midline and paraspinal muscular lumbar tenderness. Patient able to sit up without difficulty.  Bilateral inguinal tenderness.  Patient with pain with lifting legs off bed, moving down for pelvic exam. Pain in groin bilateral with passive ROM of hips  Skin:    General: Skin is warm and dry.     Capillary Refill: Capillary refill takes less than 2 seconds.  Neurological:     Mental Status: She is alert and oriented to person, place, and time.  Psychiatric:        Behavior: Behavior normal.        Thought Content: Thought content normal.        Judgment: Judgment normal.     ED Results / Procedures / Treatments   Labs (all labs ordered are listed, but only abnormal results are displayed) Labs Reviewed  WET PREP, GENITAL - Abnormal; Notable for the following components:      Result Value   Clue Cells Wet Prep HPF POC PRESENT (*)    WBC, Wet Prep HPF POC FEW (*)    All other components within normal limits  CBC WITH DIFFERENTIAL/PLATELET - Abnormal; Notable for the following components:   Hemoglobin 9.6 (*)    HCT 31.9 (*)    MCV 78.8 (*)    MCH 23.7 (*)    RDW 17.2 (*)    Platelets 606 (*)    All other components within normal limits  COMPREHENSIVE METABOLIC PANEL - Abnormal; Notable for the following components:   Glucose, Bld 102 (*)  BUN <5 (*)    All other components within normal limits  URINALYSIS, ROUTINE W REFLEX MICROSCOPIC - Abnormal; Notable for the following components:   APPearance HAZY (*)    All other components within normal limits  I-STAT BETA HCG BLOOD, ED ([facility], [facility], [facility] ONLY)  GC/CHLAMYDIA PROBE AMP ([place]) NOT AT [facility]    EKG None  Radiology No results found.  Procedures Procedures (including critical care  time)  Medications Ordered in ED Medications  cefTRIAXone (ROCEPHIN) injection 500 mg (has no administration in time range)  doxycycline (VIBRA-TABS) tablet 100 mg (100 mg Oral Given 08/14/19 1142)  azithromycin (ZITHROMAX) tablet 1,000 mg (1,000 mg Oral Given 08/14/19 1143)  oxyCODONE-acetaminophen (PERCOCET/ROXICET) 5-325 MG per tablet 1 tablet (1 tablet Oral Given 08/14/19 1142)  naproxen (NAPROSYN) tablet 500 mg (500 mg Oral Given 08/14/19 1142)  lidocaine (PF) (XYLOCAINE) 1 % injection (5 mLs  Given 08/14/19 1152)    ED Course  I have reviewed the triage vital signs and the nursing notes.  Pertinent labs & imaging results that were available during my care of the patient were reviewed by me and considered in my medical decision making (see chart for details).  Clinical Course as of Aug 14 1215  Thu Aug 14, 2019  0947 LMP beginning of month    [CG]  1145 Clue Cells Wet Prep HPF POC(!): PRESENT [CG]    Clinical Course User Index [CG] Arlean Hopping   MDM Rules/Calculators/A&P                          44 year old female presents for acute on chronic pelvic pain.  Onset since 2019.  Has associated dyspareunia.  Reports having gynecology evaluation who referred her to pelvic floor PT that slightly helped with the symptoms.  Obtained additional history from previous medical records available.  This includes several ED visits for lower abdominal and pelvic pain.  Previous providers have documented diffuse pain with pelvic exam, CMT and concern for PID.  She also carries a diagnosis of recurrent UTIs.  She has had 4 CT scans in the last 2 years and 2 pelvic ultrasounds all which were normal.  ER work-up initiated in triage including lab work, urinalysis personally visualized and interpreted.  ER work-up today is benign.  No evidence of UTI.  hCG negative.  My exam reveals diffuse tenderness in the suprapubic/lower abdomen, bilateral inguinal creases, external genitalia and pain  throughout entire exam with palpation of the cervix and adnexa.  Some discharge.  No suspicious lesions.  Some MSK component to her exam.   Wet prep shows BV but negative trichomoniasis.  Discussed with patient chronic pain with several ED visits and CTs, pelvic ultrasounds.  I reassured her that her lab work, urinalysis are normal.  She does not have any red flags like fever, nausea, vomiting, changes to her bowel movements, urinary symptoms, or vaginal symptoms.  Recently started doing yoga and she has reproducible pain along her inguinal creases, worse with palpation and movement.  I do not think further emergent imaging is necessary here.  I doubt appendicitis, diverticulitis, TOA given chronicity of pain, benign labs and exam.  Explained this to patient who actually agrees and is not interested in more CTs or ultrasounds.  She is frustrated due to her ongoing pain and being unable to be intimate with her husband.  Appears GYN has done an evaluation and referred her to pelvic floor P PT.  Given her discharge, previous diagnoses of PID, CMT today low threshold to treat for presumed PID.  I have ordered a ceftriaxone, doxycycline, naproxen and Percocet here.  We will discharge with doxycycline, Flagyl.  Encouraged her to follow-up closely with GYN.  Return precautions discussed.  Patient is in agreement with the ER plan of care and discharged  Final Clinical Impression(s) / ED Diagnoses Final diagnoses:  Chronic pelvic pain in female  Bacterial vaginitis    Rx / DC Orders ED Discharge Orders         Ordered    doxycycline (VIBRAMYCIN) 100 MG capsule  2 times daily     Discontinue  Reprint     08/14/19 1154    naproxen (NAPROSYN) 500 MG tablet  2 times daily     Discontinue  Reprint     08/14/19 1154    metroNIDAZOLE (FLAGYL) 500 MG tablet  2 times daily     Discontinue  Reprint     08/14/19 [place], Leonard Feigel J, PA-C 08/14/19 1217    Tegeler, Gwenyth Allegra,  MD 08/14/19 [phone]

## 2019-08-15 LAB — GC/CHLAMYDIA PROBE AMP (~~LOC~~) NOT AT ARMC
Chlamydia: NEGATIVE
Comment: NEGATIVE
Comment: NORMAL
Neisseria Gonorrhea: NEGATIVE

## 2019-08-29 ENCOUNTER — Encounter: Payer: Self-pay | Admitting: Physician Assistant

## 2019-08-29 ENCOUNTER — Other Ambulatory Visit: Payer: Self-pay

## 2019-08-29 ENCOUNTER — Ambulatory Visit
Admission: EM | Admit: 2019-08-29 | Discharge: 2019-08-29 | Disposition: A | Discharge status: Home / Self Care | Payer: Medicaid Other | Attending: Physician Assistant | Admitting: Physician Assistant

## 2019-08-29 DIAGNOSIS — M7662 Achilles tendinitis, left leg: Secondary | ICD-10-CM

## 2019-08-29 DIAGNOSIS — M545 Low back pain, unspecified: Secondary | ICD-10-CM

## 2019-08-29 MED ORDER — PREDNISONE 20 MG PO TABS: ORAL_TABLET | ORAL | 0 refills | Status: AC

## 2019-08-29 MED ORDER — KETOROLAC TROMETHAMINE 15 MG/ML IJ SOLN
15.0000 mg | Freq: Once | INTRAMUSCULAR | Status: AC
  Administered 2019-08-29: 15 mg via INTRAMUSCULAR

## 2019-08-29 NOTE — ED Provider Notes (Signed)
[name]    CSN: [id number] Arrival date & time: 08/29/19  1220      History   Chief Complaint Chief Complaint  Patient presents with  . Pelvic Pain    HPI Michele Trevino is a 44 y.o. female.   44 year old female with history of asthma, chronic back pain, chronic pelvic pain comes in for buttock pain and left leg pain. Denies injury/trauma. Was seen at the ED 08/13/2019 for pelvic pain, back/buttock pain. States this does not feel the same. States pain is constant to the midline lumbar/sacral area with shooting pains down the left leg. Has felt left leg swelling, and when laying down to elevate the leg, had increase in pain. Denies saddle anesthesia. History of urinary incontinence without changes. Denies loss of bowel control.   States has been working on weight loss with increased activity prior to current symptom onset.      Past Medical History:  Diagnosis Date  . Arthritis   . Asthma   . Carpal tunnel syndrome   . Morbid obesity [facility])     Patient Active Problem List   Diagnosis Date Noted  . GERD (gastroesophageal reflux disease) 07/28/2019  . History of COVID-19 07/02/2019  . Anxiety 07/02/2019  . Bronchiolitis 07/02/2019  . Acute non-recurrent maxillary sinusitis 07/02/2019  . Morbid obesity ([place])   . Mixed incontinence 03/21/2019  . UTERINE FIBROID 04/19/2006    Past Surgical History:  Procedure Laterality Date  . CESAREAN SECTION    . TUBAL LIGATION      OB History    Gravida  8   Para  4   Term      Preterm      AB  4   Living  4     SAB      TAB      Ectopic      Multiple      Live Births  4            Home Medications    Prior to Admission medications   Medication Sig Start Date End Date Taking? Authorizing Provider  albuterol (PROVENTIL) (2.5 MG/3ML) 0.083% nebulizer solution Take 3 mLs (2.5 mg total) by nebulization every 6 (six) hours as needed for wheezing or shortness of breath. 07/02/19  07/01/20  Elsie Stain, MD  albuterol (VENTOLIN HFA) 108 (90 Base) MCG/ACT inhaler Inhale 1-2 puffs into the lungs every 6 (six) hours as needed for wheezing or shortness of breath. 06/05/19   Kerin Perna, NP  chlorpheniramine-HYDROcodone (TUSSIONEX PENNKINETIC ER) 10-8 MG/5ML SUER Per cough protocol. Take 5 ml BID for 48 hours, then stop and begin Tessalon pearls. 07/04/19   Elsie Stain, MD  cyclobenzaprine (FLEXERIL) 10 MG tablet Take 1 tablet (10 mg total) by mouth 3 (three) times daily as needed for muscle spasms. 07/22/19   Kerin Perna, NP  fluticasone (FLONASE) 50 MCG/ACT nasal spray Place 2 sprays into both nostrils daily. 06/25/18   Kerin Perna, NP  ibuprofen (ADVIL) 600 MG tablet Take 1 tablet (600 mg total) by mouth every 8 (eight) hours as needed. 07/22/19   Kerin Perna, NP  loratadine (CLARITIN) 10 MG tablet Take 1 tablet (10 mg total) by mouth daily. 06/25/18   Kerin Perna, NP  naproxen (NAPROSYN) 500 MG tablet Take 1 tablet (500 mg total) by mouth 2 (two) times daily. 08/14/19   Kinnie Feil, PA-C  omeprazole (PRILOSEC) 20 MG capsule Take 1  capsule (20 mg total) by mouth daily. 07/02/19   Elsie Stain, MD  predniSONE (DELTASONE) 20 MG tablet Take 3 tablets (60 mg total) by mouth daily for 3 days, THEN 2 tablets (40 mg total) daily for 3 days, THEN 1 tablet (20 mg total) daily for 3 days. 08/29/19 09/07/19  Ok Edwards, PA-C    Family History Family History  Problem Relation Age of Onset  . Throat cancer Mother   . Diabetes Father   . Hypertension Father     Social History Social History   Tobacco Use  . Smoking status: Former Smoker    Quit date: 2005    Years since quitting: 16.5  . Smokeless tobacco: Never Used  Vaping Use  . Vaping Use: Never used  Substance Use Topics  . Alcohol use: Yes    Comment: socially   . Drug use: No     Allergies   Latex   Review of Systems Review of Systems  Reason unable to perform  ROS: See HPI as above.     Physical Exam Triage Vital Signs ED Triage Vitals  Enc Vitals Group     BP 08/29/19 1228 (!) 150/83     Pulse Rate 08/29/19 1228 94     Resp 08/29/19 1228 16     Temp 08/29/19 1228 98 F (36.7 C)     Temp src --      SpO2 08/29/19 1228 99 %     Weight --      Height --      Head Circumference --      Peak Flow --      Pain Score 08/29/19 1229 9     Pain Loc --      Pain Edu? --      Excl. in [place]? --    No data found.  Updated Vital Signs BP (!) 150/83   Pulse 94   Temp 98 F (36.7 C)   Resp 16   LMP 07/31/2019   SpO2 99%   Physical Exam Constitutional:      General: She is not in acute distress.    Appearance: She is well-developed. She is not diaphoretic.  HENT:     Head: Normocephalic and atraumatic.  Eyes:     Conjunctiva/sclera: Conjunctivae normal.     Pupils: Pupils are equal, round, and reactive to light.  Cardiovascular:     Rate and Rhythm: Normal rate and regular rhythm.     Heart sounds: Normal heart sounds. No murmur heard.  No friction rub. No gallop.   Pulmonary:     Effort: Pulmonary effort is normal. No accessory muscle usage or respiratory distress.     Breath sounds: Normal breath sounds. No stridor. No decreased breath sounds, wheezing, rhonchi or rales.  Musculoskeletal:     Comments: Diffuse tenderness to palpation of midline around L4-sacral region. Right lumbar tenderness as well. No focal tenderness to spinous processes. Decreased ROM of back and hips due to pain. Did not attempt to lay patient flat due to pain.   Diffuse tenderness to palpation of the foot with max tenderness to achilles tendon. Full ROM of ankle/toes with pain during dorsiflexion of ankle. Sensation intact. Pedal pulse 2+  Skin:    General: Skin is warm and dry.  Neurological:     Mental Status: She is alert and oriented to person, place, and time.      UC Treatments / Results  Labs (all labs ordered are listed,  but only abnormal  results are displayed) Labs Reviewed - No data to display  EKG   Radiology No results found.  Procedures Procedures (including critical care time)  Medications Ordered in UC Medications  ketorolac (TORADOL) 15 MG/ML injection 15 mg (15 mg Intramuscular Given 08/29/19 1310)    Initial Impression / Assessment and Plan / UC Course  I have reviewed the triage vital signs and the nursing notes.  Pertinent labs & imaging results that were available during my care of the patient were reviewed by me and considered in my medical decision making (see chart for details).    Did not attempt to lay patient for hip ROM/straight leg raise, however, history suspicious for sciatica. toradol injection in office today. Prednisone as directed. Continue muscle relaxant. Return precautions given.  Final Clinical Impressions(s) / UC Diagnoses   Final diagnoses:  Acute midline low back pain, unspecified whether sciatica present  Achilles tendinitis of left lower extremity    ED Prescriptions    Medication Sig Dispense Auth. Provider   predniSONE (DELTASONE) 20 MG tablet Take 3 tablets (60 mg total) by mouth daily for 3 days, THEN 2 tablets (40 mg total) daily for 3 days, THEN 1 tablet (20 mg total) daily for 3 days. 18 tablet Ok Edwards, PA-C     PDMP not reviewed this encounter.   Ok Edwards, PA-C 08/29/19 1502

## 2019-08-29 NOTE — Discharge Instructions (Signed)
Toradol injection in office today. Prednisone as directed. Can continue flexeril as needed. Follow up with sports medicine for further evaluation and management needed.

## 2019-08-29 NOTE — ED Triage Notes (Signed)
"  It feels like everything in my tushy hurts." Pt also c/o pelvic pain since 6/20, had recent work up in [place]. Also c/o pain and swelling to left foot

## 2019-09-03 ENCOUNTER — Telehealth (INDEPENDENT_AMBULATORY_CARE_PROVIDER_SITE_OTHER): Payer: Self-pay | Admitting: Primary Care

## 2019-09-03 DIAGNOSIS — R202 Paresthesia of skin: Secondary | ICD-10-CM

## 2019-09-03 NOTE — Progress Notes (Signed)
Virtual Visit via Telephone Note  I connected with Michele Trevino on 09/03/19 at  1:30 PM EDT by virtual visit  and verified that I am speaking with the correct person using two identifiers.  Patient is at home I discussed the limitations, risks, security and privacy concerns of performing an evaluation and management service by telephone and the availability of in person appointments. I also discussed with the patient that there may be a patient responsible charge related to this service. The patient expressed understanding and agreed to proceed.  Michele Mire Np-C at office  June 14 & 23 ED  History of Present Illness: [name] is having a virtual visit than see was unable to hear me changed to tele. June 14 she received the [place] vaccine the follow Monday her pelvic area started swelling . She went to the ED on 08/13/2019 for pelvic swelling was given information for muscle spasms. 08/18/2019 pelvic pan and swelling travel down to her foot . States unable to walk. 6/23/201 went to ED for barely walk and lift her legs because this made the pain worse.. Exam and tx. Presented back to ED/ urgent care for felt left leg swelling, and when laying down to elevate the leg, had increase in pain. Concerns that her hair is falling out and she has been experiencing numbness in her hand and feet.  Past Medical History:  Diagnosis Date   Arthritis    Asthma    Carpal tunnel syndrome    Morbid obesity ([place])    Current Outpatient Medications on File Prior to Visit  Medication Sig Dispense Refill   albuterol (PROVENTIL) (2.5 MG/3ML) 0.083% nebulizer solution Take 3 mLs (2.5 mg total) by nebulization every 6 (six) hours as needed for wheezing or shortness of breath. 75 mL 12   albuterol (VENTOLIN HFA) 108 (90 Base) MCG/ACT inhaler Inhale 1-2 puffs into the lungs every 6 (six) hours as needed for wheezing or shortness of breath. 8 g 1   chlorpheniramine-HYDROcodone  (TUSSIONEX PENNKINETIC ER) 10-8 MG/5ML SUER Per cough protocol. Take 5 ml BID for 48 hours, then stop and begin Tessalon pearls. 140 mL 0   cyclobenzaprine (FLEXERIL) 10 MG tablet Take 1 tablet (10 mg total) by mouth 3 (three) times daily as needed for muscle spasms. 90 tablet 0   fluticasone (FLONASE) 50 MCG/ACT nasal spray Place 2 sprays into both nostrils daily. 16 g 6   ibuprofen (ADVIL) 600 MG tablet Take 1 tablet (600 mg total) by mouth every 8 (eight) hours as needed. 90 tablet 1   loratadine (CLARITIN) 10 MG tablet Take 1 tablet (10 mg total) by mouth daily. 30 tablet 11   naproxen (NAPROSYN) 500 MG tablet Take 1 tablet (500 mg total) by mouth 2 (two) times daily. 30 tablet 0   omeprazole (PRILOSEC) 20 MG capsule Take 1 capsule (20 mg total) by mouth daily. 30 capsule 4   predniSONE (DELTASONE) 20 MG tablet Take 3 tablets (60 mg total) by mouth daily for 3 days, THEN 2 tablets (40 mg total) daily for 3 days, THEN 1 tablet (20 mg total) daily for 3 days. 18 tablet 0   No current facility-administered medications on file prior to visit.    Observations/Objective: Review of Systems  HENT:       Hair falling out   Genitourinary:       Swelling in pelvic area   Neurological: Positive for tingling.       Numbness hands and feet  Assessment and Plan: Paresthesia pricking, tingling, and numbness in any section of the body.It is an anomalous condition characterized by sensations of itching, burning, tingling, prickling, or numbness. Paresthesia may also be described as skin-crawling or pins-and-needles sensations.  Follow Up Instructions: Consulted with a pulmonologist about s/s she experience from vaccine and advised not to take the second vaccine. Called my supervising physician and validated agreement to tell patient not to take vaccine 2- yes and advised to get permission from patient to report to the CDC.   I discussed the assessment and treatment plan with the patient. The  patient was provided an opportunity to ask questions and all were answered. The patient agreed with the plan and demonstrated an understanding of the instructions.   The patient was advised to call back or seek an in-person evaluation if the symptoms worsen or if the condition fails to improve as anticipated.  I provided 30 minutes of non-face-to-face time during this encounter.   Kerin Perna, NP

## 2019-09-10 ENCOUNTER — Other Ambulatory Visit: Payer: Self-pay

## 2019-09-10 DIAGNOSIS — N63 Unspecified lump in unspecified breast: Secondary | ICD-10-CM

## 2019-09-16 ENCOUNTER — Ambulatory Visit: Payer: No Typology Code available for payment source

## 2019-09-16 ENCOUNTER — Ambulatory Visit: Payer: Self-pay | Admitting: *Deleted

## 2019-09-16 ENCOUNTER — Other Ambulatory Visit: Payer: Self-pay

## 2019-09-16 ENCOUNTER — Ambulatory Visit
Admission: RE | Admit: 2019-09-16 | Discharge: 2019-09-16 | Disposition: A | Discharge status: Home / Self Care | Payer: No Typology Code available for payment source | Source: Ambulatory Visit | Attending: Obstetrics and Gynecology | Admitting: Obstetrics and Gynecology

## 2019-09-16 VITALS — BP 124/76 | Temp 97.3°F | Wt 201.8 lb

## 2019-09-16 DIAGNOSIS — N644 Mastodynia: Secondary | ICD-10-CM

## 2019-09-16 DIAGNOSIS — N63 Unspecified lump in unspecified breast: Secondary | ICD-10-CM

## 2019-09-16 DIAGNOSIS — Z1239 Encounter for other screening for malignant neoplasm of breast: Secondary | ICD-10-CM

## 2019-09-16 NOTE — Patient Instructions (Addendum)
Informed Michele Trevino about breast self awareness. Patient did not need a Pap smear today due to last Pap smear was on 03/17/2019. Let her know [facility]CP will cover Pap smears and HPV typing every 5 years unless has a history of abnormal Pap smears. Referred patient to the [facility] for diagnostic mammogram. Appointment scheduled for September 16, 2019 at 3:20pm. Patient aware of appointment and will be there. Michele Trevino verbalized understanding.  Michele Rea, RN, FNP student 11:16 AM

## 2019-09-16 NOTE — Progress Notes (Signed)
[name] is a 44 y.o. female who presents to [facility] clinic today with complaints of skin discoloration on bilateral breasts.    Pap Smear: Pap smear not completed today. Last Pap smear was 03/17/2019 at [facility] clinic and was normal with negative HPV. Patient has a history of a normal Pap smear that was positive for HPV on 06/28/2017. Last two Pap smear results are available in Epic.   Physical exam: Breasts Breasts symmetrical. No skin abnormalities bilateral breasts. No nipple retraction bilateral breasts. No nipple discharge bilateral breasts. No lymphadenopathy. No lumps palpated bilateral breasts. Patient has diffuse tenderness during exam in bilateral breasts. She has a concern about a mark on her right breast that is slightly raised and is slightly darker than her skin. Patient was informed that this was more than likely benign.       Pelvic/Bimanual Pap is not indicated today.    Smoking History: Patient has never smoked.   Patient Navigation: Patient education provided. Access to services provided for patient through [facility]CP program.   Breast and Cervical Cancer Risk Assessment: Patient does not have family history of breast cancer, known genetic mutations, or radiation treatment to the chest before age 62. Patient does not have history of cervical dysplasia, immunocompromised, or DES exposure in-utero.  Risk Assessment    Risk Scores      09/16/2019 02/11/2019   Last edited by: Demetrius Revel, LPN [place], Heath Gold, RN   5-year risk: 0.9 % 0.9 %   Lifetime risk: 9.2 % 9.3 %          A: [facility]CP exam without pap smear Complaints of skin discoloration on right breast and diffuse bilateral breast tenderness.   P: Referred patient to the [facility] for a diagnostic mammogram. Appointment scheduled September 16, 2019 at 3:20pm.  Michele Rea, RN, FNP student 09/16/2019 11:05 AM   Attestation of Supervision of Student:  I confirm that I have  verified the information documented in the nurse practitioner student's note and that I have also personally reperformed the history, physical exam and all medical decision making activities.  I have verified that all services and findings are accurately documented in this student's note; and I agree with management and plan as outlined in the documentation. I have also made any necessary editorial changes.  [place], [place] for [employer] 09/16/2019 1:03 PM

## 2019-09-17 ENCOUNTER — Telehealth: Payer: Self-pay | Admitting: Diagnostic Neuroimaging

## 2019-09-17 ENCOUNTER — Ambulatory Visit: Payer: No Typology Code available for payment source | Admitting: Diagnostic Neuroimaging

## 2019-09-17 ENCOUNTER — Encounter: Payer: Self-pay | Admitting: Diagnostic Neuroimaging

## 2019-09-17 VITALS — BP 131/84 | HR 66 | Ht 61.0 in | Wt 200.4 lb

## 2019-09-17 DIAGNOSIS — M5442 Lumbago with sciatica, left side: Secondary | ICD-10-CM

## 2019-09-17 DIAGNOSIS — R102 Pelvic and perineal pain: Secondary | ICD-10-CM

## 2019-09-17 DIAGNOSIS — G8929 Other chronic pain: Secondary | ICD-10-CM

## 2019-09-17 DIAGNOSIS — M5441 Lumbago with sciatica, right side: Secondary | ICD-10-CM

## 2019-09-17 NOTE — Progress Notes (Signed)
[facility]  PATIENT: Michele Trevino DOB: [birth year]  REFERRING CLINICIAN: Kerin Perna, NP HISTORY FROM: patient  REASON FOR VISIT: new consult    HISTORICAL  CHIEF COMPLAINT:  Chief Complaint  Patient presents with  . Paresthesia    rm 7 New Pt "Covid in April, vaccine later- -hair falling out, fingers/toes numb, couldn't walk, muscle spasms going down my legs"    HISTORY OF PRESENT ILLNESS:   44 year old female here for evaluation of lower extremity numbness, weakness, back pain, pelvic pain.  Patient has had problems with back and pelvic pain since 2015.  Symptoms have been intermittent and progressive over time.  Patient moved from [place] to [place] around 2019.  She has had multiple ER and clinic evaluations for the symptoms since that time.  She has been treated for pelvic inflammatory disease several times.  She was also advised to start pelvic floor exercise treatments.  April 2021 patient had Covid and was evaluated emergency room.  She had significant symptoms for 3 to 4 weeks but recovered at home.  May 2021 patient had urgent care evaluation for muscle spasms of lower extremities.  August 04, 2019 patient had first dose of Covid vaccine.  August 13, 2019 patient went to the hospital for worsening pelvic pain, was treated empirically for bacterial vaginosis.    08/29/2019 patient had another urgent care evaluation for low back pain.  Patient continues to have intermittent pain in her lower back.  Her genital and pelvic pain issues have slightly improved.  He still has problems with her legs and feet.   REVIEW OF SYSTEMS: Full 14 system review of systems performed and negative with exception of: As per HPI.  ALLERGIES: Allergies  Allergen Reactions  . Latex Swelling    HOME MEDICATIONS: Outpatient Medications Prior to Visit  Medication Sig Dispense Refill  . albuterol (PROVENTIL) (2.5 MG/3ML) 0.083% nebulizer solution  Take 3 mLs (2.5 mg total) by nebulization every 6 (six) hours as needed for wheezing or shortness of breath. 75 mL 12  . albuterol (VENTOLIN HFA) 108 (90 Base) MCG/ACT inhaler Inhale 1-2 puffs into the lungs every 6 (six) hours as needed for wheezing or shortness of breath. 8 g 1  . cyclobenzaprine (FLEXERIL) 10 MG tablet Take 1 tablet (10 mg total) by mouth 3 (three) times daily as needed for muscle spasms. 90 tablet 0  . fluticasone (FLONASE) 50 MCG/ACT nasal spray Place 2 sprays into both nostrils daily. 16 g 6  . ibuprofen (ADVIL) 600 MG tablet Take 1 tablet (600 mg total) by mouth every 8 (eight) hours as needed. 90 tablet 1  . loratadine (CLARITIN) 10 MG tablet Take 1 tablet (10 mg total) by mouth daily. 30 tablet 11  . omeprazole (PRILOSEC) 20 MG capsule Take 1 capsule (20 mg total) by mouth daily. (Patient not taking: Reported on 09/17/2019) 30 capsule 4  . chlorpheniramine-HYDROcodone (TUSSIONEX PENNKINETIC ER) 10-8 MG/5ML SUER Per cough protocol. Take 5 ml BID for 48 hours, then stop and begin Tessalon pearls. 140 mL 0  . naproxen (NAPROSYN) 500 MG tablet Take 1 tablet (500 mg total) by mouth 2 (two) times daily. 30 tablet 0   No facility-administered medications prior to visit.    PAST MEDICAL HISTORY: Past Medical History:  Diagnosis Date  . Arthritis   . Asthma   . Carpal tunnel syndrome   . Morbid obesity ([place])     PAST SURGICAL HISTORY: Past Surgical History:  Procedure Laterality Date  .  CESAREAN SECTION  [phone]   x 3  . TUBAL LIGATION      FAMILY HISTORY: Family History  Problem Relation Age of Onset  . Throat cancer Mother   . Diabetes Father   . Hypertension Father     SOCIAL HISTORY: Social History   Socioeconomic History  . Marital status: Married    Spouse name: Not on file  . Number of children: 4  . Years of education: Not on file  . Highest education level: Some college, no degree  Occupational History    Comment: NA  Tobacco Use  .  Smoking status: Former Smoker    Quit date: 2005    Years since quitting: 16.5  . Smokeless tobacco: Never Used  Vaping Use  . Vaping Use: Never used  Substance and Sexual Activity  . Alcohol use: Yes    Comment: socially   . Drug use: No  . Sexual activity: Yes    Birth control/protection: Surgical  Other Topics Concern  . Not on file  Social History Narrative   Lives with family   Social Determinants of Health   Financial Resource Strain:   . Difficulty of Paying Living Expenses:   Food Insecurity:   . Worried About Charity fundraiser in the Last Year:   . Arboriculturist in the Last Year:   Transportation Needs: No Transportation Needs  . Lack of Transportation (Medical): No  . Lack of Transportation (Non-Medical): No  Physical Activity:   . Days of Exercise per Week:   . Minutes of Exercise per Session:   Stress:   . Feeling of Stress :   Social Connections:   . Frequency of Communication with Friends and Family:   . Frequency of Social Gatherings with Friends and Family:   . Attends Religious Services:   . Active Member of Clubs or Organizations:   . Attends Archivist Meetings:   Marland Kitchen Marital Status:   Intimate Partner Violence:   . Fear of Current or Ex-Partner:   . Emotionally Abused:   Marland Kitchen Physically Abused:   . Sexually Abused:      PHYSICAL EXAM  GENERAL EXAM/CONSTITUTIONAL: Vitals:  Vitals:   09/17/19 1334  BP: (!) 131/84  Pulse: 66  Weight: 200 lb 6.4 oz (90.9 kg)  Height: 5\' 1"  (1.549 m)     Body mass index is 37.87 kg/m. Wt Readings from Last 3 Encounters:  09/17/19 200 lb 6.4 oz (90.9 kg)  09/16/19 201 lb 12.8 oz (91.5 kg)  08/13/19 220 lb 7.4 oz (100 kg)     Patient is in no distress; well developed, nourished and groomed; neck is supple  CARDIOVASCULAR:  Examination of carotid arteries is normal; no carotid bruits  Regular rate and rhythm, no murmurs  Examination of peripheral vascular system by observation and  palpation is normal  EYES:  Ophthalmoscopic exam of optic discs and posterior segments is normal; no papilledema or hemorrhages  No exam data present  MUSCULOSKELETAL:  Gait, strength, tone, movements noted in Neurologic exam below  NEUROLOGIC: MENTAL STATUS:  No flowsheet data found.  awake, alert, oriented to person, place and time  recent and remote memory intact  normal attention and concentration  language fluent, comprehension intact, naming intact  fund of knowledge appropriate  CRANIAL NERVE:   2nd - no papilledema on fundoscopic exam  2nd, 3rd, 4th, 6th - pupils equal and reactive to light, visual fields full to confrontation, extraocular muscles intact, no nystagmus  5th - facial sensation symmetric  7th - facial strength symmetric  8th - hearing intact  9th - palate elevates symmetrically, uvula midline  11th - shoulder shrug symmetric  12th - tongue protrusion midline  MOTOR:   normal bulk and tone, full strength in the BUE, BLE  SENSORY:   normal and symmetric to light touch, temperature, vibration  COORDINATION:   finger-nose-finger, fine finger movements normal  REFLEXES:   deep tendon reflexes TRACE and symmetric  GAIT/STATION:   narrow based gait     DIAGNOSTIC DATA (LABS, IMAGING, TESTING) - I reviewed patient records, labs, notes, testing and imaging myself where available.  Lab Results  Component Value Date   WBC 8.6 08/13/2019   HGB 9.6 (L) 08/13/2019   HCT 31.9 (L) 08/13/2019   MCV 78.8 (L) 08/13/2019   PLT 606 (H) 08/13/2019      Component Value Date/Time   NA 140 08/13/2019 2100   K 4.0 08/13/2019 2100   CL 107 08/13/2019 2100   CO2 24 08/13/2019 2100   GLUCOSE 102 (H) 08/13/2019 2100   BUN <5 (L) 08/13/2019 2100   CREATININE 0.76 08/13/2019 2100   CALCIUM 9.1 08/13/2019 2100   PROT 7.0 08/13/2019 2100   ALBUMIN 3.5 08/13/2019 2100   AST 22 08/13/2019 2100   ALT 20 08/13/2019 2100   ALKPHOS 46  08/13/2019 2100   BILITOT 0.5 08/13/2019 2100   GFRNONAA >60 08/13/2019 2100   GFRAA >60 08/13/2019 2100   Lab Results  Component Value Date   CHOL 172 12/28/2017   HDL 52 12/28/2017   LDLCALC 106 (H) 12/28/2017   TRIG 69 12/28/2017   CHOLHDL 3.3 12/28/2017   Lab Results  Component Value Date   HGBA1C 5.9 (A) 08/14/2018   No results found for: [id number] Lab Results  Component Value Date   TSH 2.090 08/14/2018    03/07/17 CT abdomen / pelvis  - No findings to explain the patient's symptoms.  08/05/18 CT abd/pelvis - No evidence of obstructive uropathy or other acute abnormality.    ASSESSMENT AND PLAN  44 y.o. year old female here with:  Dx:  1. Chronic bilateral low back pain with bilateral sciatica   2. Pelvic pain      PLAN:  CHRONIC PELVIC PAIN, LOW BACK PAIN, LEG PAIN (intermittent, worsening, since 2015; failed PT and conservative mgmt) - MRI lumbar spine - symptoms are gradually improving - continue to monitor symptoms; continue PT exercises / yoga  Orders Placed This Encounter  Procedures  . MR LUMBAR SPINE WO CONTRAST   Return for pending if symptoms worsen or fail to improve.    Penni Bombard, MD 09/14/3662, 4:03 PM Certified in Neurology, Neurophysiology and Neuroimaging  [facility] [address], [place], [place] [address] [phone]

## 2019-09-17 NOTE — Patient Instructions (Signed)
CHRONIC PELVIC PAIN, LOW BACK PAIN, LEG PAIN (intermittent, worsening, since 2015; failed PT and conservative mgmt) - MRI lumbar spine - symptoms are gradually improving - continue to monitor symptoms; continue PT exercises / yoga

## 2019-09-17 NOTE — Telephone Encounter (Signed)
self pay order sent to GI. They will reach out to the patient to schedule.  °

## 2019-09-28 ENCOUNTER — Other Ambulatory Visit: Payer: Self-pay

## 2019-09-28 ENCOUNTER — Ambulatory Visit
Admission: RE | Admit: 2019-09-28 | Discharge: 2019-09-28 | Disposition: A | Discharge status: Home / Self Care | Payer: No Typology Code available for payment source | Source: Ambulatory Visit | Attending: Diagnostic Neuroimaging | Admitting: Diagnostic Neuroimaging

## 2019-09-28 DIAGNOSIS — M5441 Lumbago with sciatica, right side: Secondary | ICD-10-CM

## 2019-09-28 DIAGNOSIS — G8929 Other chronic pain: Secondary | ICD-10-CM

## 2019-09-28 DIAGNOSIS — M5442 Lumbago with sciatica, left side: Secondary | ICD-10-CM

## 2019-09-28 DIAGNOSIS — R102 Pelvic and perineal pain: Secondary | ICD-10-CM

## 2019-09-30 ENCOUNTER — Telehealth: Payer: Self-pay | Admitting: *Deleted

## 2019-09-30 ENCOUNTER — Telehealth: Payer: Self-pay | Admitting: Neurology

## 2019-09-30 DIAGNOSIS — M48062 Spinal stenosis, lumbar region with neurogenic claudication: Secondary | ICD-10-CM

## 2019-09-30 NOTE — Telephone Encounter (Signed)
Phone rep worked Special educational needs teacher, at 10:35 pt left message asking when she will be called with results.  Pt informed RN has sent a message asking the results be viewed and for pt to be called.  Pt will wait for a call with results.

## 2019-09-30 NOTE — Telephone Encounter (Signed)
Dr Dohmeier has contacted the patient and reviewed these findings.

## 2019-09-30 NOTE — Telephone Encounter (Signed)
Dr Dohmeier called patient and reviewed results.

## 2019-09-30 NOTE — Telephone Encounter (Signed)
    Report per Dr Felecia Shelling, MD , PhD.   tis patient has severe degenerative changes to the lower spine but no clear indication for nerve root compression. However, spinal stenosi is present. The patient may chose to seek a neurosurgical consult.  I will make a referral for her.  I will call this patient - Michele Sequeira, MD    L3-L4: There is disc bulging and ligamenta flava hypertrophy causing mild spinal stenosis.  There is no nerve root compression.  L4-L5: There is severe spinal stenosis (AP diameter 5.8 mm) due to minimal anterolisthesis, bulging of the uncovered disc, severe facet hypertrophy and ligamenta flava hypertrophy.  There is mild left and mild to moderate right foraminal narrowing.  There is also mild left and mild to moderate right lateral recess stenosis.  There does not appear to be nerve root compression.  L5-S1: There is severe spinal stenosis (AP diameter 5.8 mm) due to disc bulging, facet hypertrophy and severe ligamenta flava hypertrophy.  Additionally, there is increased epidural fat..  There is mild foraminal narrowing and mild lateral recess stenosis but there does not appear to be nerve root compression.   IMPRESSION: This MRI of the lumbar spine without contrast shows the following: 1.   At L3-L4, there is mild spinal stenosis due to degenerative changes.  There is no nerve root compression. 2.   At L4-L5, there is severe spinal stenosis due to degenerative changes and mild anterolisthesis.  There is mild to moderate right foraminal and lateral recess narrowing but there does not appear to be any definite nerve root compression. 3.   At L5-S1, there is severe spinal stenosis due to degenerative changes.  There does not appear to be nerve root compression.

## 2019-10-09 NOTE — Telephone Encounter (Signed)
I reviewed imaging. I recommend neurosurgery consult. I called patient to let her know.  Orders Placed This Encounter  Procedures  . Ambulatory referral to Neurosurgery    Referral Priority:   Routine    Referral Type:   Surgical    Referral Reason:   Specialty Services Required    Requested Specialty:   Neurosurgery    Number of Visits Requested:   [place], MD 7/47/1855, 0:15 PM Certified in Neurology, Neurophysiology and Neuroimaging  [facility] [address] [place], [place] [address] [phone]

## 2019-10-09 NOTE — Addendum Note (Signed)
Addended by: Andrey Spearman R on: 10/09/2019 04:55 PM   Modules accepted: Orders

## 2019-10-09 NOTE — Telephone Encounter (Signed)
Pt would like a call from Dr Leta Baptist or his RN re: what his plan of care is for her as a result of the test results.  Please call

## 2019-10-14 ENCOUNTER — Other Ambulatory Visit: Payer: No Typology Code available for payment source

## 2019-10-16 ENCOUNTER — Other Ambulatory Visit: Payer: Self-pay

## 2019-10-16 ENCOUNTER — Encounter (INDEPENDENT_AMBULATORY_CARE_PROVIDER_SITE_OTHER): Payer: Self-pay | Admitting: Primary Care

## 2019-10-16 ENCOUNTER — Telehealth (INDEPENDENT_AMBULATORY_CARE_PROVIDER_SITE_OTHER): Payer: Medicaid Other | Admitting: Primary Care

## 2019-10-16 DIAGNOSIS — M48061 Spinal stenosis, lumbar region without neurogenic claudication: Secondary | ICD-10-CM

## 2019-10-16 DIAGNOSIS — F4323 Adjustment disorder with mixed anxiety and depressed mood: Secondary | ICD-10-CM

## 2019-10-16 NOTE — Telephone Encounter (Signed)
Pt called wanting to ask several questions about her MRI results. Please advise.

## 2019-10-16 NOTE — Progress Notes (Signed)
Telephone Note  I connected with Michele Trevino on 10/16/19 at 11:10 AM EDT by telephone and verified that I am speaking with the correct person using two identifiers.  Patient is at home I discussed the limitations, risks, security and privacy concerns of performing an evaluation and management service by telephone and the availability of in person appointments. I also discussed with the patient that there may be a patient responsible charge related to this service. The patient expressed understanding and agreed to proceed. Michele Mire, NP in office [facility]  History of Present Illness: Michele Trevino is a 44 year old female wants to discuss appointment with neurology not quite sure of the results. Stressed for being referred to neurosurgery for lumbar spinal stenosis .Upset in constant pain decrease ability to walk, bend and move freely without pain Numbness and tingling in hands and feet Current Outpatient Medications on File Prior to Visit  Medication Sig Dispense Refill  . albuterol (PROVENTIL) (2.5 MG/3ML) 0.083% nebulizer solution Take 3 mLs (2.5 mg total) by nebulization every 6 (six) hours as needed for wheezing or shortness of breath. 75 mL 12  . albuterol (VENTOLIN HFA) 108 (90 Base) MCG/ACT inhaler Inhale 1-2 puffs into the lungs every 6 (six) hours as needed for wheezing or shortness of breath. 8 g 1  . cyclobenzaprine (FLEXERIL) 10 MG tablet Take 1 tablet (10 mg total) by mouth 3 (three) times daily as needed for muscle spasms. 90 tablet 0  . fluticasone (FLONASE) 50 MCG/ACT nasal spray Place 2 sprays into both nostrils daily. 16 g 6  . ibuprofen (ADVIL) 600 MG tablet Take 1 tablet (600 mg total) by mouth every 8 (eight) hours as needed. 90 tablet 1  . loratadine (CLARITIN) 10 MG tablet Take 1 tablet (10 mg total) by mouth daily. 30 tablet 11  . omeprazole (PRILOSEC) 20 MG capsule Take 1 capsule (20 mg total) by mouth daily. 30 capsule 4   No current  facility-administered medications on file prior to visit.   Past Medical History:  Diagnosis Date  . Arthritis   . Asthma   . Carpal tunnel syndrome   . Morbid obesity ([place])    Observations/Objective: Review of Systems  Musculoskeletal: Positive for back pain.  Neurological: Positive for tingling and weakness.  All other systems reviewed and are negative.   Assessment and Plan: Michele Trevino was seen today for follow-up.  Diagnoses and all orders for this visit:  Spinal stenosis of lumbar region, unspecified whether neurogenic claudication present Discussed what she wa  Adjustment disorder with mixed anxiety and depressed mood   Office Visit from 07/28/2019 in [place]  PHQ-9 Total Score 14    Follow up with CSW the main issue is wanting her life back able to do for self and not hurt all the time and not knowing the unknown   Follow Up Instructions:    I discussed the assessment and treatment plan with the patient. The patient was provided an opportunity to ask questions and all were answered. The patient agreed with the plan and demonstrated an understanding of the instructions.   The patient was advised to call back or seek an in-person evaluation if the symptoms worsen or if the condition fails to improve as anticipated.  I provided 18 minutes of non-face-to-face time during this encounter.   Kerin Perna, NP

## 2019-10-16 NOTE — Telephone Encounter (Addendum)
Called patient who stated when Dr Leta Baptist called her she didn't know what questions to ask. Attempted to answer questions, however she requested MD call her. She requested records which she can get through my chart, and I advised she call [facility] to get MRI disk.  Gave her # for [employer] and [facility] to check on referral. I advised will let Dr Leta Baptist know she requests a call back. Patient verbalized understanding, appreciation.

## 2019-10-16 NOTE — Telephone Encounter (Signed)
I called patient to clarify.Patient has lumbar spinal stenosis which is symptomatic. I recommend she follow up with neurosurgery consult. -VRP

## 2019-10-20 ENCOUNTER — Ambulatory Visit: Payer: Self-pay | Attending: Family Medicine

## 2019-10-20 ENCOUNTER — Other Ambulatory Visit: Payer: Self-pay

## 2019-10-30 ENCOUNTER — Other Ambulatory Visit: Payer: Self-pay | Admitting: Neurosurgery

## 2019-10-30 DIAGNOSIS — M48062 Spinal stenosis, lumbar region with neurogenic claudication: Secondary | ICD-10-CM

## 2019-10-31 ENCOUNTER — Ambulatory Visit: Payer: Self-pay | Attending: Family Medicine

## 2019-10-31 ENCOUNTER — Other Ambulatory Visit: Payer: Self-pay

## 2019-11-05 ENCOUNTER — Other Ambulatory Visit: Payer: Self-pay

## 2019-11-05 ENCOUNTER — Ambulatory Visit
Admission: RE | Admit: 2019-11-05 | Discharge: 2019-11-05 | Disposition: A | Discharge status: Home / Self Care | Payer: No Typology Code available for payment source | Source: Ambulatory Visit | Attending: Neurosurgery | Admitting: Neurosurgery

## 2019-11-05 DIAGNOSIS — M48062 Spinal stenosis, lumbar region with neurogenic claudication: Secondary | ICD-10-CM

## 2019-11-05 MED ORDER — METHYLPREDNISOLONE ACETATE 40 MG/ML INJ SUSP (RADIOLOG
120.0000 mg | Freq: Once | INTRAMUSCULAR | Status: AC
  Administered 2019-11-05: 120 mg via EPIDURAL

## 2019-11-05 MED ORDER — IOPAMIDOL (ISOVUE-M 200) INJECTION 41%
1.0000 mL | Freq: Once | INTRAMUSCULAR | Status: AC
  Administered 2019-11-05: 1 mL via EPIDURAL

## 2019-11-05 NOTE — Discharge Instructions (Signed)

## 2019-12-15 ENCOUNTER — Other Ambulatory Visit: Payer: Self-pay | Admitting: Neurosurgery

## 2019-12-15 DIAGNOSIS — M48062 Spinal stenosis, lumbar region with neurogenic claudication: Secondary | ICD-10-CM

## 2019-12-19 ENCOUNTER — Inpatient Hospital Stay: Admission: RE | Admit: 2019-12-19 | Payer: Medicaid Other | Source: Ambulatory Visit

## 2019-12-25 ENCOUNTER — Other Ambulatory Visit: Payer: Self-pay

## 2019-12-25 ENCOUNTER — Ambulatory Visit
Admission: RE | Admit: 2019-12-25 | Discharge: 2019-12-25 | Disposition: A | Discharge status: Home / Self Care | Payer: No Typology Code available for payment source | Source: Ambulatory Visit | Attending: Neurosurgery | Admitting: Neurosurgery

## 2019-12-25 DIAGNOSIS — M48062 Spinal stenosis, lumbar region with neurogenic claudication: Secondary | ICD-10-CM

## 2019-12-25 MED ORDER — IOPAMIDOL (ISOVUE-M 200) INJECTION 41%
1.0000 mL | Freq: Once | INTRAMUSCULAR | Status: AC
  Administered 2019-12-25: 1 mL via EPIDURAL

## 2019-12-25 MED ORDER — METHYLPREDNISOLONE ACETATE 40 MG/ML INJ SUSP (RADIOLOG
120.0000 mg | Freq: Once | INTRAMUSCULAR | Status: AC
  Administered 2019-12-25: 120 mg via EPIDURAL

## 2019-12-25 NOTE — Discharge Instructions (Signed)

## 2020-01-26 ENCOUNTER — Ambulatory Visit: Payer: No Typology Code available for payment source

## 2020-03-15 ENCOUNTER — Other Ambulatory Visit: Payer: Self-pay | Admitting: Neurosurgery

## 2020-03-15 DIAGNOSIS — M48062 Spinal stenosis, lumbar region with neurogenic claudication: Secondary | ICD-10-CM

## 2020-03-16 ENCOUNTER — Ambulatory Visit
Admission: RE | Admit: 2020-03-16 | Discharge: 2020-03-16 | Disposition: A | Discharge status: Home / Self Care | Payer: No Typology Code available for payment source | Source: Ambulatory Visit | Attending: Neurosurgery | Admitting: Neurosurgery

## 2020-03-16 ENCOUNTER — Other Ambulatory Visit: Payer: Self-pay

## 2020-03-16 DIAGNOSIS — M48062 Spinal stenosis, lumbar region with neurogenic claudication: Secondary | ICD-10-CM

## 2020-03-16 MED ORDER — METHYLPREDNISOLONE ACETATE 40 MG/ML INJ SUSP (RADIOLOG
120.0000 mg | Freq: Once | INTRAMUSCULAR | Status: AC
  Administered 2020-03-16: 120 mg via EPIDURAL

## 2020-03-16 MED ORDER — IOPAMIDOL (ISOVUE-M 200) INJECTION 41%
1.0000 mL | Freq: Once | INTRAMUSCULAR | Status: AC
  Administered 2020-03-16: 1 mL via EPIDURAL

## 2020-03-16 NOTE — Discharge Instructions (Signed)

## 2020-03-18 ENCOUNTER — Other Ambulatory Visit: Payer: Self-pay

## 2020-03-18 ENCOUNTER — Ambulatory Visit (INDEPENDENT_AMBULATORY_CARE_PROVIDER_SITE_OTHER): Payer: No Typology Code available for payment source | Admitting: Primary Care

## 2020-03-18 ENCOUNTER — Encounter (INDEPENDENT_AMBULATORY_CARE_PROVIDER_SITE_OTHER): Payer: Self-pay | Admitting: Primary Care

## 2020-03-18 VITALS — BP 134/89 | HR 64 | Temp 97.3°F | Ht 61.0 in | Wt 207.8 lb

## 2020-03-18 DIAGNOSIS — F4310 Post-traumatic stress disorder, unspecified: Secondary | ICD-10-CM

## 2020-03-18 DIAGNOSIS — Z1211 Encounter for screening for malignant neoplasm of colon: Secondary | ICD-10-CM

## 2020-03-18 DIAGNOSIS — M48061 Spinal stenosis, lumbar region without neurogenic claudication: Secondary | ICD-10-CM

## 2020-03-18 DIAGNOSIS — R030 Elevated blood-pressure reading, without diagnosis of hypertension: Secondary | ICD-10-CM

## 2020-03-18 NOTE — Progress Notes (Signed)
Established Patient Office Visit  Subjective:  Patient ID: Michele Trevino, female    DOB: 02/17/1976  Age: 45 y.o. MRN: [id number]  CC:  Chief Complaint  Patient presents with  . Referral    Gastro for colonoscopy     HPI [name] presents for requesting a colon cancer screening - she saw a advertisement about screening 25- 58. She has spinal stenosis and arthritis in her spine she is followed by neurologist and had an injection 03/16/20 and is still in a lot of pain. This may also be a contributing factor to slightly elevated Bp. She does have chest pain states it started in the back of neck right side her husband massage it and pain subsided.   Past Medical History:  Diagnosis Date  . Arthritis   . Asthma   . Carpal tunnel syndrome   . Morbid obesity ([place])     Past Surgical History:  Procedure Laterality Date  . CESAREAN SECTION  [phone]   x 3  . TUBAL LIGATION      Family History  Problem Relation Age of Onset  . Throat cancer Mother   . Diabetes Father   . Hypertension Father     Social History   Socioeconomic History  . Marital status: Married    Spouse name: Not on file  . Number of children: 4  . Years of education: Not on file  . Highest education level: Some college, no degree  Occupational History    Comment: NA  Tobacco Use  . Smoking status: Former Smoker    Quit date: 2005    Years since quitting: 17.0  . Smokeless tobacco: Never Used  Vaping Use  . Vaping Use: Never used  Substance and Sexual Activity  . Alcohol use: Yes    Comment: socially   . Drug use: No  . Sexual activity: Yes    Birth control/protection: Surgical  Other Topics Concern  . Not on file  Social History Narrative   Lives with family   Social Determinants of Health   Financial Resource Strain: Not on file  Food Insecurity: Not on file  Transportation Needs: No Transportation Needs  . Lack of Transportation (Medical): No  . Lack of  Transportation (Non-Medical): No  Physical Activity: Not on file  Stress: Not on file  Social Connections: Not on file  Intimate Partner Violence: Not on file    Outpatient Medications Prior to Visit  Medication Sig Dispense Refill  . albuterol (PROVENTIL) (2.5 MG/3ML) 0.083% nebulizer solution Take 3 mLs (2.5 mg total) by nebulization every 6 (six) hours as needed for wheezing or shortness of breath. 75 mL 12  . albuterol (VENTOLIN HFA) 108 (90 Base) MCG/ACT inhaler Inhale 1-2 puffs into the lungs every 6 (six) hours as needed for wheezing or shortness of breath. 8 g 1  . cyclobenzaprine (FLEXERIL) 10 MG tablet Take 1 tablet (10 mg total) by mouth 3 (three) times daily as needed for muscle spasms. 90 tablet 0  . fluticasone (FLONASE) 50 MCG/ACT nasal spray Place 2 sprays into both nostrils daily. 16 g 6  . ibuprofen (ADVIL) 600 MG tablet Take 1 tablet (600 mg total) by mouth every 8 (eight) hours as needed. 90 tablet 1  . loratadine (CLARITIN) 10 MG tablet Take 1 tablet (10 mg total) by mouth daily. 30 tablet 11  . omeprazole (PRILOSEC) 20 MG capsule Take 1 capsule (20 mg total) by mouth daily. 30 capsule 4   No  facility-administered medications prior to visit.    Allergies  Allergen Reactions  . Latex Swelling    ROS Review of Systems  Cardiovascular: Positive for chest pain.  Musculoskeletal: Positive for back pain.  Neurological: Positive for weakness.       Legs  All other systems reviewed and are negative.     Objective:    Physical Exam Vitals reviewed.  Constitutional:      Appearance: She is obese.  HENT:     Head: Normocephalic.     Right Ear: Tympanic membrane normal.     Left Ear: Tympanic membrane normal.     Nose: Nose normal.  Eyes:     Extraocular Movements: Extraocular movements intact.  Cardiovascular:     Rate and Rhythm: Normal rate and regular rhythm.  Pulmonary:     Effort: Pulmonary effort is normal.     Breath sounds: Normal breath sounds.   Abdominal:     General: Bowel sounds are normal.     Palpations: Abdomen is soft.  Musculoskeletal:        General: Normal range of motion.     Cervical back: Normal range of motion.  Skin:    General: Skin is warm and dry.  Neurological:     Mental Status: She is alert and oriented to person, place, and time.  Psychiatric:        Mood and Affect: Mood normal.        Behavior: Behavior normal.        Thought Content: Thought content normal.        Judgment: Judgment normal.     BP 134/89 (BP Location: Right Arm, Patient Position: Sitting, Cuff Size: Large)   Pulse 64   Temp (!) 97.3 F (36.3 C) (Temporal)   Ht 5\' 1"  (1.549 m)   Wt 207 lb 12.8 oz (94.3 kg)   LMP 02/05/2020 (Approximate)   SpO2 97%   BMI 39.26 kg/m  Wt Readings from Last 3 Encounters:  03/18/20 207 lb 12.8 oz (94.3 kg)  09/17/19 200 lb 6.4 oz (90.9 kg)  09/16/19 201 lb 12.8 oz (91.5 kg)     Health Maintenance Due  Topic Date Due  . Hepatitis C Screening  Never done  . COVID-19 Vaccine (1) Never done  . COLONOSCOPY (Pts 45-58yrs Insurance coverage will need to be confirmed)  Never done    There are no preventive care reminders to display for this patient.  Lab Results  Component Value Date   TSH 2.090 08/14/2018   Lab Results  Component Value Date   WBC 8.6 08/13/2019   HGB 9.6 (L) 08/13/2019   HCT 31.9 (L) 08/13/2019   MCV 78.8 (L) 08/13/2019   PLT 606 (H) 08/13/2019   Lab Results  Component Value Date   NA 140 08/13/2019   K 4.0 08/13/2019   CO2 24 08/13/2019   GLUCOSE 102 (H) 08/13/2019   BUN <5 (L) 08/13/2019   CREATININE 0.76 08/13/2019   BILITOT 0.5 08/13/2019   ALKPHOS 46 08/13/2019   AST 22 08/13/2019   ALT 20 08/13/2019   PROT 7.0 08/13/2019   ALBUMIN 3.5 08/13/2019   CALCIUM 9.1 08/13/2019   ANIONGAP 9 08/13/2019   Lab Results  Component Value Date   CHOL 172 12/28/2017   Lab Results  Component Value Date   HDL 52 12/28/2017   Lab Results  Component Value Date    LDLCALC 106 (H) 12/28/2017   Lab Results  Component Value Date  TRIG 69 12/28/2017   Lab Results  Component Value Date   CHOLHDL 3.3 12/28/2017   Lab Results  Component Value Date   HGBA1C 5.9 (A) 08/14/2018      Assessment & Plan:  Yaniris was seen today for referral.  Diagnoses and all orders for this visit:  Colon cancer screening Normal colon cancer screening.  CDC recommends colorectal screening from ages 80-75. Screening can begin at 26 or earlier in some cases. This screening is used for a disease when no symptoms are present .   -     Ambulatory referral to Gastroenterology  Elevated BP without diagnosis of hypertension Discussed what  Hypertension is and may lead to damage the arteries and decrease blood flow to important parts of the body, including the brain, heart, and kidneys. Having untreated or uncontrolled hypertension can lead to:  A heart attack.  A stroke.  A weakened blood vessel (aneurysm).  Heart failure.  Kidney damage.  Eye damage.  Metabolic syndrome.  Spinal stenosis of lumbar region, unspecified whether neurogenic claudication present  Lumbosacral spondylosis without myelopathy. Pain in the low back and legs with spasms, currently greater on the right. Followed by Dr. Deri Fuelling   PTSD (post-traumatic stress disorder) Childhood trauma keep suppressed not able to discuss now realizing she needs treatment. [facility]crets is interfering with her life. Refer to CSW and therapy.      Follow-up: Return for first available CSW.    Kerin Perna, NP

## 2020-03-18 NOTE — Patient Instructions (Signed)
Managing Post-Traumatic Stress Disorder If you have been diagnosed with post-traumatic stress disorder (PTSD), you may be relieved that you now know why you have felt or behaved a certain way. Still, you may feel overwhelmed about the treatment ahead. You may also wonder how to get the support you need and how to deal with the condition day-to-day. If you are living with PTSD, there are ways to help you recover from it and manage your symptoms. How to manage lifestyle changes Managing stress Stress is your body's reaction to life changes and events, both good and bad. Stress can make PTSD worse. Take the following steps to manage stress:  Talk with your health care provider or a counselor if you would like to learn more about techniques to reduce your stress. He or she may suggest some stress reduction techniques such as: ? Muscle relaxation exercises. ? Regular exercise. ? Meditation, yoga, or other mind-body exercises. ? Breathing exercises. ? Listening to quiet music. ? Spending time outside.  Maintain a healthy lifestyle. Eat a healthy diet, exercise regularly, get plenty of sleep, and take time to relax.  Spend time with others. Talk with them about how you are feeling and what kind of support you need. Try not to isolate yourself, even though you may feel like doing that. Isolating yourself can delay your recovery.  Do activities and hobbies that you enjoy.  Pace yourself when doing stressful things. Take breaks, and reward yourself when you finish. Make sure that you do not overload your schedule.   Medicines Your health care provider may suggest certain medicines if he or she feels that they will help to improve your condition. Medicines for depression (antidepressants) or severe loss of contact with reality (antipsychotics) may be used to treat PTSD. Avoid using alcohol and other substances that may prevent your medicines from working properly. It is also important to:  Talk with  your pharmacist or health care provider about all medicines that you take, their possible side effects, and which medicines are safe to take together.  Make it your goal to take part in all treatment decisions (shared decision-making). Ask about possible side effects of medicines that your health care provider recommends, and tell him or her how you feel about having those side effects. It is best if shared decision-making with your health care provider is part of your total treatment plan. If your health care provider prescribes a medicine, you may not notice the full benefits of it for 4-8 weeks. Most people who are treated for PTSD need to take medicine for at least 6-12 months before they feel better. If you are taking medicines as part of your treatment, do not stop taking medicines before you ask your health care provider if it is safe to stop. You may need to have the medicine slowly decreased (tapered) over time to lower the risk of harmful side effects. Relationships Many people who have PTSD have difficulty trusting others. Make an effort to:  Take risks and develop trust with close friends and family members. Developing trust in others can help you feel safe and connect you with emotional support.  Be open and honest about your feelings.  Have fun and relax in safe spaces, such as with friends and family.  Think about going to couples counseling, family education classes, or family therapy. Your loved ones may not always know how to be supportive. Therapy can be helpful for everyone. How to recognize changes in your condition Be aware of   your symptoms and how often you have them. The following symptoms mean that you need to seek help for your PTSD:  You feel suspicious and angry.  You have repeated flashbacks.  You avoid going out or being with others.  You have an increasing number of fights with close friends or family members, such as your spouse.  You have thoughts about  hurting yourself or others.  You cannot get relief from feelings of depression or anxiety. Follow these instructions at home: Lifestyle  Exercise regularly. Try to do 30 or more minutes of physical activity on most days of the week.  Try to get 7-9 hours of sleep each night. To help with sleep: ? Keep your bedroom cool and dark. ? Avoid screen time before bedtime. This means avoiding use of your TV, computer, tablet, and cell phone.  Practice self-soothing skills and use them daily.  Try to have fun and seek humor in your life. Eating and drinking  Do not eat a heavy meal during the hour before you go to bed.  Do not drink alcohol or caffeinated drinks before bed.  Avoid using alcohol or drugs. General instructions  If your PTSD is affecting your marriage or family, seek help from a family therapist.  Remind yourself that recovering from the trauma is a process and takes time.  Take over-the-counter and prescription medicines only as told by your health care provider.  Make sure to let all of your health care providers know that you have PTSD. This is especially important if you are having surgery or need to be admitted to the hospital.  Keep all follow-up visits as told by your health care providers. This is important. Where to find support Talking to others  Explain that PTSD is a mental health problem. It is something that a person can develop after experiencing or seeing a life-threatening event. Tell them that PTSD makes you feel stress like you did during the event.  Talk to your loved ones about the symptoms you have. Also tell them what things or situations can cause symptoms to start (are triggers for you).  Assure your loved ones that there are treatments to help PTSD. Discuss possibly seeking family therapy or couples therapy.  If you are worried or fearful about seeking treatment, ask for support.  Keep daily contact with at least one trusted friend or family  member. Finances Not all insurance plans cover mental health care, so it is important to check with your insurance carrier. If paying for co-pays or counseling services is a problem, search for a local or county mental health care center. Public mental health care services may be offered there at a low cost or no cost when you are not able to see a private health care provider. If you are a veteran, contact a local veterans organization or veterans hospital for more information. If you are taking medicine for PTSD, you may be able to get the genericform, which may be less expensive than brand-name medicine. Some makers of prescription medicines also offer help to patients who cannot afford the medicines that they need. Therapy and support groups  Find a support group in your community. Often, groups are available for military veterans, trauma victims, and family members or caregivers.  Look into volunteer opportunities. Taking part in these can help you feel more connected to your community.  Contact a local organization to find out if you are eligible for a service dog. Where to find more information Go   to this website to find more information about PTSD, treatment of PTSD, and how to get support:  National Center for PTSD: www.ptsd.va.gov Contact a health care provider if:  Your symptoms get worse or do not get better. Get help right away if:  You have thoughts about hurting yourself or others. If you ever feel like you may hurt yourself or others, or have thoughts about taking your own life, get help right away. You can go to your nearest emergency department or call:  Your local emergency services (911 in the [place]  A suicide crisis helpline, such as the National Suicide Prevention Lifeline at 1-800-273-8255. This is open 24-hours a day. Summary  If you are living with PTSD, there are ways to help you recover from it and manage your symptoms.  Find supportive environments and  people who understand PTSD. Spend time in those places, and maintain contact with those people.  Work with your health care team to create a plan for managing PTSD. The plan should include counseling, stress reduction techniques, and healthy lifestyle habits. This information is not intended to replace advice given to you by your health care provider. Make sure you discuss any questions you have with your health care provider. Document Revised: 10/24/2019 Document Reviewed: 10/24/2019 Elsevier Patient Education  2021 Elsevier Inc.  

## 2020-04-01 ENCOUNTER — Ambulatory Visit
Admission: EM | Admit: 2020-04-01 | Discharge: 2020-04-01 | Discharge status: Absent without leave | Payer: No Typology Code available for payment source

## 2020-04-01 ENCOUNTER — Encounter (INDEPENDENT_AMBULATORY_CARE_PROVIDER_SITE_OTHER): Payer: Self-pay | Admitting: Primary Care

## 2020-04-01 ENCOUNTER — Encounter (HOSPITAL_COMMUNITY): Payer: Self-pay | Admitting: Emergency Medicine

## 2020-04-01 ENCOUNTER — Other Ambulatory Visit: Payer: Self-pay

## 2020-04-01 ENCOUNTER — Ambulatory Visit (HOSPITAL_COMMUNITY)
Admission: EM | Admit: 2020-04-01 | Discharge: 2020-04-01 | Disposition: A | Discharge status: Home / Self Care | Payer: Self-pay | Attending: Emergency Medicine | Admitting: Emergency Medicine

## 2020-04-01 DIAGNOSIS — R1032 Left lower quadrant pain: Secondary | ICD-10-CM

## 2020-04-01 MED ORDER — IBUPROFEN 600 MG PO TABS: 600.0000 mg | ORAL_TABLET | Freq: Four times a day (QID) | ORAL | 0 refills | Status: DC | PRN

## 2020-04-01 NOTE — ED Triage Notes (Signed)
Pt presents with left groin pain that started yesterday. States woke up this am with stiff neck and headache. States has hx of back/ leg pain after fall in 2015.

## 2020-04-01 NOTE — Discharge Instructions (Addendum)
Follow-up with [facility] as soon as you can.  They do have walk-in clinics.  In the meantime, 600 mg of ibuprofen combined with 1000 mg of Tylenol together 3-4 times a day as needed for pain.  Stool softeners.  Drink extra water.  You do not want to be constipated as it can make the hernia worse.  No heavy lifting.

## 2020-04-01 NOTE — ED Provider Notes (Signed)
HPI  SUBJECTIVE:  Michele Trevino is a 45 y.o. female who presents with acute onset of constant, stabbing left groin pain starting last night. It does not radiate or migrate. No change in physical activity, trauma to the area, abdominal pain, groin bulging, labial bulging. She tried massage. Symptoms are better with lying down, worse with hip flexion, internal/external rotation and with putting pressure on her hip. She has a past medical history of asthma, has been on repeated short courses of steroids in the past. No prolonged steroid use. She has a history of COVID and spinal stenosis. No history of aortic abdominal aneurysm, AVN, hip fracture, diabetes, hypertension. LMP: 12/16. Denies the possibility of being pregnant. [facility]:[id number], Milford Cage, NP   Past Medical History:  Diagnosis Date  . Arthritis   . Asthma   . Carpal tunnel syndrome   . Morbid obesity ([place])     Past Surgical History:  Procedure Laterality Date  . CESAREAN SECTION  [phone]   x 3  . TUBAL LIGATION      Family History  Problem Relation Age of Onset  . Throat cancer Mother   . Diabetes Father   . Hypertension Father     Social History   Tobacco Use  . Smoking status: Former Smoker    Quit date: 2005    Years since quitting: 17.1  . Smokeless tobacco: Never Used  Vaping Use  . Vaping Use: Never used  Substance Use Topics  . Alcohol use: Yes    Comment: socially   . Drug use: No    No current facility-administered medications for this encounter.  Current Outpatient Medications:  .  ibuprofen (ADVIL) 600 MG tablet, Take 1 tablet (600 mg total) by mouth every 6 (six) hours as needed., Disp: 30 tablet, Rfl: 0 .  albuterol (PROVENTIL) (2.5 MG/3ML) 0.083% nebulizer solution, Take 3 mLs (2.5 mg total) by nebulization every 6 (six) hours as needed for wheezing or shortness of breath., Disp: 75 mL, Rfl: 12 .  albuterol (VENTOLIN HFA) 108 (90 Base) MCG/ACT inhaler, Inhale 1-2 puffs into the  lungs every 6 (six) hours as needed for wheezing or shortness of breath., Disp: 8 g, Rfl: 1 .  cyclobenzaprine (FLEXERIL) 10 MG tablet, Take 1 tablet (10 mg total) by mouth 3 (three) times daily as needed for muscle spasms., Disp: 90 tablet, Rfl: 0 .  fluticasone (FLONASE) 50 MCG/ACT nasal spray, Place 2 sprays into both nostrils daily., Disp: 16 g, Rfl: 6 .  loratadine (CLARITIN) 10 MG tablet, Take 1 tablet (10 mg total) by mouth daily., Disp: 30 tablet, Rfl: 11 .  omeprazole (PRILOSEC) 20 MG capsule, Take 1 capsule (20 mg total) by mouth daily., Disp: 30 capsule, Rfl: 4  Allergies  Allergen Reactions  . Latex Swelling     ROS  As noted in HPI.   Physical Exam  BP 140/89 (BP Location: Right Arm)   Pulse 70   Temp 98 F (36.7 C) (Oral)   Resp 18   LMP 02/05/2020   SpO2 98%   Constitutional: Well developed, well nourished, no acute distress Eyes:  EOMI, conjunctiva normal bilaterally HENT: Normocephalic, atraumatic,mucus membranes moist Respiratory: Normal inspiratory effort Cardiovascular: Normal rate.  Femoral pulses intact.  No bruit. GI: nondistended GU: Normal appearance.  Positive tenderness in the left inguinal area extending down to the labia.  No appreciated labial swelling. skin: No rash, skin intact Musculoskeletal: No tenderness along the muscles of the left inner, anterior thigh. Neurologic: Alert &  oriented x 3, no focal neuro deficits Psychiatric: Speech and behavior appropriate   ED Course   Medications - No data to display  No orders of the defined types were placed in this encounter.   No results found for this or any previous visit (from the past 24 hour(s)). No results found.  ED Clinical Impression  1. Left inguinal pain      ED Assessment/Plan  Suspect femoral hernia.  Do not think this is musculoskeletal.  Femoral artery aneurysm in the differential, but there is no bruit, pulsation, and it extends down into the labia.  No evidence of  infection.  No evidence of incarceration, strangulation.  Will refer to [facility] for further imaging and follow-up ASAP.  Tylenol/ibuprofen, stool softeners, no heavy lifting.  ER return precautions given  Discussed  MDM, treatment plan, and plan for follow-up with patient. Discussed sn/sx that should prompt return to the ED. patient agrees with plan.   Meds ordered this encounter  Medications  . ibuprofen (ADVIL) 600 MG tablet    Sig: Take 1 tablet (600 mg total) by mouth every 6 (six) hours as needed.    Dispense:  30 tablet    Refill:  0    *This clinic note was created using [employer]. Therefore, there may be occasional mistakes despite careful proofreading.   ? Left inguinal area   Melynda Ripple, MD 04/01/20 1330

## 2020-04-07 ENCOUNTER — Encounter: Payer: Self-pay | Admitting: Physician Assistant

## 2020-04-09 ENCOUNTER — Encounter: Payer: Self-pay | Admitting: Physician Assistant

## 2020-04-23 ENCOUNTER — Encounter: Payer: Self-pay | Admitting: Physician Assistant

## 2020-04-23 ENCOUNTER — Ambulatory Visit (INDEPENDENT_AMBULATORY_CARE_PROVIDER_SITE_OTHER): Payer: 59 | Admitting: Physician Assistant

## 2020-04-23 VITALS — BP 100/70 | HR 70 | Ht 61.0 in | Wt 203.0 lb

## 2020-04-23 DIAGNOSIS — K219 Gastro-esophageal reflux disease without esophagitis: Secondary | ICD-10-CM | POA: Diagnosis not present

## 2020-04-23 DIAGNOSIS — R1310 Dysphagia, unspecified: Secondary | ICD-10-CM

## 2020-04-23 DIAGNOSIS — R194 Change in bowel habit: Secondary | ICD-10-CM | POA: Diagnosis not present

## 2020-04-23 DIAGNOSIS — R1032 Left lower quadrant pain: Secondary | ICD-10-CM

## 2020-04-23 MED ORDER — PANTOPRAZOLE SODIUM 40 MG PO TBEC: 40.0000 mg | DELAYED_RELEASE_TABLET | Freq: Two times a day (BID) | ORAL | 5 refills | Status: DC

## 2020-04-23 NOTE — Patient Instructions (Addendum)
If you are age 47 or older, your body mass index should be between 23-30. Your Body mass index is 38.36 kg/m. If this is out of the aforementioned range listed, please consider follow up with your Primary Care Provider.  If you are age 27 or younger, your body mass index should be between 19-25. Your Body mass index is 38.36 kg/m. If this is out of the aformentioned range listed, please consider follow up with your Primary Care Provider.   .You have been scheduled for an endoscopy and colonoscopy. Please follow the written instructions given to you at your visit today. Please pick up your prep supplies at the pharmacy within the next 1-3 days. If you use inhalers (even only as needed), please bring them with you on the day of your procedure.  We have sent the following medications to your pharmacy for you to pick up at your convenience: Pantoprazole 40 mg   Thank you for choosing me and [facility].  Ellouise Newer, PA-C

## 2020-04-23 NOTE — Progress Notes (Signed)
Chief Complaint: Change in bowel habits, GERD, dysphagia  HPI:    Michele Trevino is a 45 year old African-American female with a past medical history as listed below as well as spinal stenosis, who was referred to me by Kerin Perna, NP for a complaint of change in bowel habits, GERD, dysphagia.      03/18/2020 patient saw her PCP in regards to referral for screening colonoscopy.    04/01/2020 patient seen in the ED for left groin pain.  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  She was to be referred to [place] [facility] for further imaging and follow-up.    Today, the patient tells me she has a lot of GI issues.  Over the past year or so she has noticed a change in her bowel habits, she will radiate from constipation to diarrhea and is not able to predict this.  Prior to this was having one hard to pass bowel movement a week.  Currently is using smooth move tea a couple of days out of the week.     Also describes constant reflux, was started on Omeprazole 20 mg a year ago, but took this daily and saw no change, currently she is using this as needed and baking soda and water concoction about 3 out of 7 days of the week which instantly soothes her heartburn.  Also describes that things feel like they are getting stuck when she swallows them down.    Patient also describes groin pain as an ER visit above.  This is worse with movement and seem to happen out of the blue.  Not worsening since visit to the ER.    Mother of 4 children and tells me she lives in a constant state of stress.    Denies fever, chills, weight loss, blood in her stool or symptoms that awaken her from sleep.  Past Medical History:  Diagnosis Date  . Arthritis   . Asthma   . Carpal tunnel syndrome   . Morbid obesity ([place])     Past Surgical History:  Procedure Laterality Date  . CESAREAN SECTION  [phone]   x 3  . TUBAL LIGATION      Current Outpatient Medications  Medication  Sig Dispense Refill  . albuterol (PROVENTIL) (2.5 MG/3ML) 0.083% nebulizer solution Take 3 mLs (2.5 mg total) by nebulization every 6 (six) hours as needed for wheezing or shortness of breath. 75 mL 12  . albuterol (VENTOLIN HFA) 108 (90 Base) MCG/ACT inhaler Inhale 1-2 puffs into the lungs every 6 (six) hours as needed for wheezing or shortness of breath. 8 g 1  . cyclobenzaprine (FLEXERIL) 10 MG tablet Take 1 tablet (10 mg total) by mouth 3 (three) times daily as needed for muscle spasms. 90 tablet 0  . fluticasone (FLONASE) 50 MCG/ACT nasal spray Place 2 sprays into both nostrils daily. 16 g 6  . ibuprofen (ADVIL) 600 MG tablet Take 1 tablet (600 mg total) by mouth every 6 (six) hours as needed. 30 tablet 0  . loratadine (CLARITIN) 10 MG tablet Take 1 tablet (10 mg total) by mouth daily. 30 tablet 11  . omeprazole (PRILOSEC) 20 MG capsule Take 1 capsule (20 mg total) by mouth daily. 30 capsule 4   No current facility-administered medications for this visit.    Allergies as of 04/23/2020 - Review Complete 04/01/2020  Allergen Reaction Noted  . Latex Swelling 03/07/2017    Family History  Problem Relation Age of Onset  .  Throat cancer Mother   . Diabetes Father   . Hypertension Father     Social History   Socioeconomic History  . Marital status: Married    Spouse name: Not on file  . Number of children: 4  . Years of education: Not on file  . Highest education level: Some college, no degree  Occupational History    Comment: NA  Tobacco Use  . Smoking status: Former Smoker    Quit date: 2005    Years since quitting: 17.1  . Smokeless tobacco: Never Used  Vaping Use  . Vaping Use: Never used  Substance and Sexual Activity  . Alcohol use: Yes    Comment: socially   . Drug use: No  . Sexual activity: Yes    Birth control/protection: Surgical  Other Topics Concern  . Not on file  Social History Narrative   Lives with family   Social Determinants of Health   Financial  Resource Strain: Not on file  Food Insecurity: Not on file  Transportation Needs: No Transportation Needs  . Lack of Transportation (Medical): No  . Lack of Transportation (Non-Medical): No  Physical Activity: Not on file  Stress: Not on file  Social Connections: Not on file  Intimate Partner Violence: Not on file    Review of Systems:    Constitutional: No weight loss, fever or chills Skin: No rash Cardiovascular: No chest pain  Respiratory: No SOB  Gastrointestinal: See HPI and otherwise negative Genitourinary: No dysuria Neurological: No headache, dizziness or syncope Musculoskeletal: No new muscle or joint pain Hematologic: No bleeding Psychiatric: No history of depression or anxiety   Physical Exam:  Vital signs: BP 100/70 (BP Location: Left Arm, Patient Position: Sitting, Cuff Size: Large)   Pulse 70   Ht 5\' 1"  (1.549 m)   Wt 203 lb (92.1 kg)   LMP 04/17/2020 (Approximate)   BMI 38.36 kg/m   Constitutional:   Pleasant overweight AA female appears to be in NAD, Well developed, Well nourished, alert and cooperative Head:  Normocephalic and atraumatic. Eyes:   PEERL, EOMI. No icterus. Conjunctiva pink. Ears:  Normal auditory acuity. Neck:  Supple Throat: Oral cavity and pharynx without inflammation, swelling or lesion.  Respiratory: Respirations even and unlabored. Lungs clear to auscultation bilaterally.   No wheezes, crackles, or rhonchi.  Cardiovascular: Normal S1, S2. No MRG. Regular rate and rhythm. No peripheral edema, cyanosis or pallor.  Gastrointestinal:  Soft, nondistended, mild generalized ttp, some worse in epigastrum, No rebound or guarding. Normal bowel sounds. No appreciable masses or hepatomegaly. Rectal:  Not performed.  Msk:  Symmetrical without gross deformities. Without edema, no deformity or joint abnormality. +left groin ttp Neurologic:  Alert and  oriented x4;  grossly normal neurologically.  Skin:   Dry and intact without significant lesions or  rashes. Psychiatric: Demonstrates good judgement and reason without abnormal affect or behaviors.  See HPI for recent evaluations.  Assessment: 1.  Change in bowel habits: Radiates from constipation to diarrhea; consider relation to diet versus IBS versus other 2.  GERD: Daily, no better with Omeprazole 20 mg daily; consider gastritis +/- PUD 3.  Dysphagia: Feels like food gets stuck at times; consider stricture versus esophagitis versus other 4.  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  Scheduled patient for diagnostic EGD and colonoscopy in [place].  These are  scheduled with Dr. Rush Landmark.  Did provide the patient with a detailed list of risks for the procedures and she agrees to proceed. 2.  Start the patient on Pantoprazole 40 mg daily, 30-60 minutes before breakfast #30 with 5 refills.  She can discontinue omeprazole. 3.  Reviewed antireflux diet and lifestyle modifications. 4.  Suspect that some of her bowel habits may be related to irritable bowel as she does endorse high stress. 5.  Discussed groin pain, I do not believe this is GI related, but we will see if there is anything found on recent colonoscopy, if pain increases or worsens would recommend she proceed back to the ER discussed the CT with her PCP. 6.  Patient to follow in clinic per recommendations from Dr. Rush Landmark after time of procedures.  Ellouise Newer, PA-C [facility] 04/23/2020, 8:31 AM  Cc: Kerin Perna, NP

## 2020-04-24 NOTE — Progress Notes (Signed)
Attending Physician's Attestation   I have reviewed the chart.   I agree with the Advanced Practitioner's note, impression, and recommendations with any updates as below.    Niara Bunker Mansouraty, MD [facility] Advanced Endoscopy Office # [phone]  

## 2020-04-29 ENCOUNTER — Encounter (HOSPITAL_COMMUNITY): Payer: Self-pay | Admitting: Emergency Medicine

## 2020-04-29 ENCOUNTER — Ambulatory Visit (HOSPITAL_COMMUNITY)
Admission: EM | Admit: 2020-04-29 | Discharge: 2020-04-29 | Disposition: A | Discharge status: Home / Self Care | Payer: 59 | Attending: Emergency Medicine | Admitting: Emergency Medicine

## 2020-04-29 ENCOUNTER — Other Ambulatory Visit: Payer: Self-pay

## 2020-04-29 DIAGNOSIS — N3001 Acute cystitis with hematuria: Secondary | ICD-10-CM | POA: Diagnosis not present

## 2020-04-29 LAB — CERVICOVAGINAL ANCILLARY ONLY
Bacterial Vaginitis (gardnerella): POSITIVE — AB
Candida Glabrata: NEGATIVE
Candida Vaginitis: NEGATIVE
Chlamydia: NEGATIVE
Comment: NEGATIVE
Comment: NEGATIVE
Comment: NEGATIVE
Comment: NEGATIVE
Comment: NEGATIVE
Comment: NORMAL
Neisseria Gonorrhea: NEGATIVE
Trichomonas: NEGATIVE

## 2020-04-29 LAB — POCT URINALYSIS DIPSTICK, ED / UC
Bilirubin Urine: NEGATIVE
Glucose, UA: NEGATIVE mg/dL
Ketones, ur: NEGATIVE mg/dL
Nitrite: NEGATIVE
Protein, ur: NEGATIVE mg/dL
Specific Gravity, Urine: 1.015 (ref 1.005–1.030)
Urobilinogen, UA: 0.2 mg/dL (ref 0.0–1.0)
pH: 6 (ref 5.0–8.0)

## 2020-04-29 MED ORDER — SULFAMETHOXAZOLE-TRIMETHOPRIM 800-160 MG PO TABS: 1.0000 | ORAL_TABLET | Freq: Two times a day (BID) | ORAL | 0 refills | Status: AC

## 2020-04-29 MED ORDER — FLUCONAZOLE 150 MG PO TABS: 150.0000 mg | ORAL_TABLET | Freq: Once | ORAL | 1 refills | Status: AC

## 2020-04-29 MED ORDER — METRONIDAZOLE 500 MG PO TABS: 500.0000 mg | ORAL_TABLET | Freq: Two times a day (BID) | ORAL | 0 refills | Status: AC

## 2020-04-29 NOTE — ED Provider Notes (Signed)
HPI  SUBJECTIVE:  Michele Trevino is a 45 y.o. female who presents with 2 days of urinary urgency, dysuria, frequency, cloudy, odorous urine, hematuria.  She reports left-sided back pain starting today.  She reports nausea, low midline crampy abdominal pain/pressure yesterday that has resolved.  She also reports a vaginal odor and itching.  She denies vaginal discharge, rash, bleeding.  She is in long-term monogamous relationship with her husband who is asymptomatic, however would like STD testing today.  No antibiotics in the past month.  No Antipyretic in the past 6 hours.  She tried Azo with improvement in her symptoms.  She also tried cranberry juice, garlic, increasing fluids, ginger.  Symptoms worse with urination.  She has a past medical history of frequent UTIs.  She also has history of chlamydia, trichomonas, BV, yeast.  No history of gonorrhea, HIV, HSV, syphilis, diabetes, hypertension. PE: Last week.  Denies the possibility being pregnant.  Status post bilateral tubal ligation.  PMD: Kerin Perna, NP   Patient has a past medical history of Klebsiella, E. coli and Enterobacter UTIs that were resistant to Keflex, Macrobid, but sensitive to Bactrim.    Past Medical History:  Diagnosis Date  . Arthritis   . Asthma   . Carpal tunnel syndrome   . Morbid obesity ([place])     Past Surgical History:  Procedure Laterality Date  . CESAREAN SECTION  [phone]   x 3  . TUBAL LIGATION      Family History  Problem Relation Age of Onset  . Throat cancer Mother   . Diabetes Father   . Hypertension Father     Social History   Tobacco Use  . Smoking status: Former Smoker    Quit date: 2005    Years since quitting: 17.1  . Smokeless tobacco: Never Used  Vaping Use  . Vaping Use: Never used  Substance Use Topics  . Alcohol use: Yes    Comment: socially   . Drug use: No    No current facility-administered medications for this encounter.  Current Outpatient  Medications:  .  fluconazole (DIFLUCAN) 150 MG tablet, Take 1 tablet (150 mg total) by mouth once for 1 dose. 1 tab po x 1. May repeat in 72 hours if no improvement, Disp: 2 tablet, Rfl: 1 .  metroNIDAZOLE (FLAGYL) 500 MG tablet, Take 1 tablet (500 mg total) by mouth 2 (two) times daily for 7 days., Disp: 14 tablet, Rfl: 0 .  sulfamethoxazole-trimethoprim (BACTRIM DS) 800-160 MG tablet, Take 1 tablet by mouth 2 (two) times daily for 3 days., Disp: 6 tablet, Rfl: 0 .  albuterol (PROVENTIL) (2.5 MG/3ML) 0.083% nebulizer solution, Take 3 mLs (2.5 mg total) by nebulization every 6 (six) hours as needed for wheezing or shortness of breath., Disp: 75 mL, Rfl: 12 .  albuterol (VENTOLIN HFA) 108 (90 Base) MCG/ACT inhaler, Inhale 1-2 puffs into the lungs every 6 (six) hours as needed for wheezing or shortness of breath., Disp: 8 g, Rfl: 1 .  cyclobenzaprine (FLEXERIL) 10 MG tablet, Take 1 tablet (10 mg total) by mouth 3 (three) times daily as needed for muscle spasms., Disp: 90 tablet, Rfl: 0 .  fluticasone (FLONASE) 50 MCG/ACT nasal spray, Place 2 sprays into both nostrils daily., Disp: 16 g, Rfl: 6 .  ibuprofen (ADVIL) 600 MG tablet, Take 1 tablet (600 mg total) by mouth every 6 (six) hours as needed., Disp: 30 tablet, Rfl: 0 .  loratadine (CLARITIN) 10 MG tablet, Take 1 tablet (10  mg total) by mouth daily., Disp: 30 tablet, Rfl: 11 .  omeprazole (PRILOSEC) 20 MG capsule, Take 1 capsule (20 mg total) by mouth daily., Disp: 30 capsule, Rfl: 4 .  pantoprazole (PROTONIX) 40 MG tablet, Take 1 tablet (40 mg total) by mouth 2 (two) times daily. Take one tablet 30-60 minutes before breakfast and dinner., Disp: 30 tablet, Rfl: 5  Allergies  Allergen Reactions  . Latex Swelling     ROS  As noted in HPI.   Physical Exam  BP 139/87 (BP Location: Left Arm)   Pulse 70   Temp 98.2 F (36.8 C) (Oral)   Resp 18   LMP 04/17/2020 (Approximate)   SpO2 97%   Constitutional: Well developed, well nourished, no  acute distress Eyes:  EOMI, conjunctiva normal bilaterally HENT: Normocephalic, atraumatic,mucus membranes moist Respiratory: Normal inspiratory effort Cardiovascular: Normal rate GI: nondistended soft.  Positive suprapubic tenderness.  No flank tenderness. Back: No CVAT skin: No rash, skin intact Musculoskeletal: no deformities Neurologic: Alert & oriented x 3, no focal neuro deficits Psychiatric: Speech and behavior appropriate   ED Course   Medications - No data to display  Orders Placed This Encounter  Procedures  . Urine culture    Standing Status:   Standing    Number of Occurrences:   1    Order Specific Question:   List patient's active antibiotics    Answer:   bactrim  . POC Urinalysis dipstick    Standing Status:   Standing    Number of Occurrences:   1    Results for orders placed or performed during the hospital encounter of 04/29/20 (from the past 24 hour(s))  POC Urinalysis dipstick     Status: Abnormal   Collection Time: 04/29/20  8:40 AM  Result Value Ref Range   Glucose, UA NEGATIVE NEGATIVE mg/dL   Bilirubin Urine NEGATIVE NEGATIVE   Ketones, ur NEGATIVE NEGATIVE mg/dL   Specific Gravity, Urine 1.015 1.005 - 1.030   Hgb urine dipstick TRACE (A) NEGATIVE   pH 6.0 5.0 - 8.0   Protein, ur NEGATIVE NEGATIVE mg/dL   Urobilinogen, UA 0.2 0.0 - 1.0 mg/dL   Nitrite NEGATIVE NEGATIVE   Leukocytes,Ua TRACE (A) NEGATIVE   No results found.  ED Clinical Impression  1. Acute cystitis with hematuria      ED Assessment/Plan  Previous labs reviewed.  As noted in HPI.  UA, H&P consistent with UTI.  No evidence of pyelonephritis. will send urine off for culture to confirm antibiotic choice.  last 2 urine cultures were resistant to cephalosporins and Macrobid but sensitive to Bactrim, will send home with Bactrim.  Will also send home with Pyridium, wait-and-see prescription of Flagyl because of the vaginal odor and wait-and-see prescription of Diflucan.   Follow-up with PMD as needed.  To the ER if she gets worse  Discussed labs, MDM, treatment plan, and plan for follow-up with patient. Discussed sn/sx that should prompt return to the ED. patient agrees with plan.   Meds ordered this encounter  Medications  . sulfamethoxazole-trimethoprim (BACTRIM DS) 800-160 MG tablet    Sig: Take 1 tablet by mouth 2 (two) times daily for 3 days.    Dispense:  6 tablet    Refill:  0  . metroNIDAZOLE (FLAGYL) 500 MG tablet    Sig: Take 1 tablet (500 mg total) by mouth 2 (two) times daily for 7 days.    Dispense:  14 tablet    Refill:  0  . fluconazole (  DIFLUCAN) 150 MG tablet    Sig: Take 1 tablet (150 mg total) by mouth once for 1 dose. 1 tab po x 1. May repeat in 72 hours if no improvement    Dispense:  2 tablet    Refill:  1    *This clinic note was created using [employer]. Therefore, there may be occasional mistakes despite careful proofreading.   ?    Melynda Ripple, MD 04/29/20 [phone]

## 2020-04-29 NOTE — ED Triage Notes (Signed)
Pt presents with dysuria, lower back pain and urinary frequency xs 2 days. States has been taking AZO, drinking cranberry juice and garlic with some relief.

## 2020-04-29 NOTE — Discharge Instructions (Addendum)
Finish Bactrim, even if you feel better.  I am sending you urine off for culture to make sure that you are on the right antibiotic.  Pyridium will help with your symptoms.  Continue pushing plenty of extra fluids.  I am sending you home with prescriptions for of Flagyl for possible BV and Diflucan for possible yeast.  You can wait for your wet prep results to start them if you wish.

## 2020-04-30 LAB — URINE CULTURE: Culture: <10000 — AB

## 2020-05-03 ENCOUNTER — Encounter (INDEPENDENT_AMBULATORY_CARE_PROVIDER_SITE_OTHER): Payer: Self-pay | Admitting: Primary Care

## 2020-05-18 ENCOUNTER — Other Ambulatory Visit (INDEPENDENT_AMBULATORY_CARE_PROVIDER_SITE_OTHER): Payer: 59

## 2020-05-18 ENCOUNTER — Other Ambulatory Visit: Payer: Self-pay

## 2020-05-18 ENCOUNTER — Encounter (INDEPENDENT_AMBULATORY_CARE_PROVIDER_SITE_OTHER): Payer: Self-pay | Admitting: Primary Care

## 2020-05-18 ENCOUNTER — Other Ambulatory Visit (HOSPITAL_COMMUNITY)
Admission: RE | Admit: 2020-05-18 | Discharge: 2020-05-18 | Disposition: A | Discharge status: Home / Self Care | Payer: 59 | Source: Ambulatory Visit | Attending: Primary Care | Admitting: Primary Care

## 2020-05-18 ENCOUNTER — Ambulatory Visit (INDEPENDENT_AMBULATORY_CARE_PROVIDER_SITE_OTHER): Payer: 59 | Admitting: Primary Care

## 2020-05-18 VITALS — BP 115/88 | HR 69 | Temp 97.5°F | Ht 61.0 in | Wt 206.4 lb

## 2020-05-18 DIAGNOSIS — Z131 Encounter for screening for diabetes mellitus: Secondary | ICD-10-CM

## 2020-05-18 DIAGNOSIS — N921 Excessive and frequent menstruation with irregular cycle: Secondary | ICD-10-CM

## 2020-05-18 DIAGNOSIS — F331 Major depressive disorder, recurrent, moderate: Secondary | ICD-10-CM

## 2020-05-18 DIAGNOSIS — Z760 Encounter for issue of repeat prescription: Secondary | ICD-10-CM

## 2020-05-18 DIAGNOSIS — F419 Anxiety disorder, unspecified: Secondary | ICD-10-CM

## 2020-05-18 DIAGNOSIS — N898 Other specified noninflammatory disorders of vagina: Secondary | ICD-10-CM | POA: Insufficient documentation

## 2020-05-18 DIAGNOSIS — H8113 Benign paroxysmal vertigo, bilateral: Secondary | ICD-10-CM

## 2020-05-18 DIAGNOSIS — J302 Other seasonal allergic rhinitis: Secondary | ICD-10-CM

## 2020-05-18 DIAGNOSIS — N3946 Mixed incontinence: Secondary | ICD-10-CM

## 2020-05-18 DIAGNOSIS — Z13220 Encounter for screening for lipoid disorders: Secondary | ICD-10-CM

## 2020-05-18 DIAGNOSIS — M48061 Spinal stenosis, lumbar region without neurogenic claudication: Secondary | ICD-10-CM

## 2020-05-18 DIAGNOSIS — N644 Mastodynia: Secondary | ICD-10-CM

## 2020-05-18 LAB — POCT GLYCOSYLATED HEMOGLOBIN (HGB A1C): Hemoglobin A1C: 5.5 % (ref 4.0–5.6)

## 2020-05-18 MED ORDER — FLUTICASONE PROPIONATE 50 MCG/ACT NA SUSP: 2.0000 | Freq: Every day | NASAL | 6 refills | Status: DC

## 2020-05-18 MED ORDER — LORATADINE 10 MG PO TABS: 10.0000 mg | ORAL_TABLET | Freq: Every day | ORAL | 11 refills | Status: DC

## 2020-05-18 MED ORDER — OXYBUTYNIN CHLORIDE ER 10 MG PO TB24: 10.0000 mg | ORAL_TABLET | Freq: Every day | ORAL | 1 refills | Status: DC

## 2020-05-18 MED ORDER — ALBUTEROL SULFATE HFA 108 (90 BASE) MCG/ACT IN AERS: 1.0000 | INHALATION_SPRAY | Freq: Four times a day (QID) | RESPIRATORY_TRACT | 1 refills | Status: AC | PRN

## 2020-05-18 NOTE — Progress Notes (Signed)
Pt needs referral to physical therapy for spinal stenosis

## 2020-05-18 NOTE — Progress Notes (Signed)
Established Patient Office Visit  Subjective:  Patient ID: Michele Trevino, female    DOB: 1975/03/11  Age: 45 y.o. MRN: [id number]  CC:  Chief Complaint  Patient presents with  . bacteria vaginosis     Re-occurring      HPI Michele Trevino is 45 year old obese female presents for re-occurring BV recently treated and feels like it has return. Chronic back pain dx by Dr. Annette Stable Spinal stenosis of lumbar region, unspecified whether neurogenic claudication present- requesting physical therapy and what type of exercises she can do. Started walking and back pain increased. Deferred to Dr. Annette Stable.  Past Medical History:  Diagnosis Date  . Arthritis   . Asthma   . Carpal tunnel syndrome   . Morbid obesity ([place])     Past Surgical History:  Procedure Laterality Date  . CESAREAN SECTION  [phone]   x 3  . TUBAL LIGATION      Family History  Problem Relation Age of Onset  . Throat cancer Mother   . Diabetes Father   . Hypertension Father     Social History   Socioeconomic History  . Marital status: Married    Spouse name: Not on file  . Number of children: 4  . Years of education: Not on file  . Highest education level: Some college, no degree  Occupational History    Comment: NA  Tobacco Use  . Smoking status: Former Smoker    Quit date: 2005    Years since quitting: 17.2  . Smokeless tobacco: Never Used  Vaping Use  . Vaping Use: Never used  Substance and Sexual Activity  . Alcohol use: Yes    Comment: socially   . Drug use: No  . Sexual activity: Yes    Birth control/protection: Surgical  Other Topics Concern  . Not on file  Social History Narrative   Lives with family   Social Determinants of Health   Financial Resource Strain: Not on file  Food Insecurity: Not on file  Transportation Needs: No Transportation Needs  . Lack of Transportation (Medical): No  . Lack of Transportation (Non-Medical): No  Physical Activity: Not on  file  Stress: Not on file  Social Connections: Not on file  Intimate Partner Violence: Not on file    Outpatient Medications Prior to Visit  Medication Sig Dispense Refill  . albuterol (PROVENTIL) (2.5 MG/3ML) 0.083% nebulizer solution Take 3 mLs (2.5 mg total) by nebulization every 6 (six) hours as needed for wheezing or shortness of breath. 75 mL 12  . pantoprazole (PROTONIX) 40 MG tablet Take 1 tablet (40 mg total) by mouth 2 (two) times daily. Take one tablet 30-60 minutes before breakfast and dinner. 30 tablet 5  . albuterol (VENTOLIN HFA) 108 (90 Base) MCG/ACT inhaler Inhale 1-2 puffs into the lungs every 6 (six) hours as needed for wheezing or shortness of breath. 8 g 1  . fluticasone (FLONASE) 50 MCG/ACT nasal spray Place 2 sprays into both nostrils daily. 16 g 6  . loratadine (CLARITIN) 10 MG tablet Take 1 tablet (10 mg total) by mouth daily. 30 tablet 11  . ibuprofen (ADVIL) 600 MG tablet Take 1 tablet (600 mg total) by mouth every 6 (six) hours as needed. (Patient not taking: Reported on 05/18/2020) 30 tablet 0  . cyclobenzaprine (FLEXERIL) 10 MG tablet Take 1 tablet (10 mg total) by mouth 3 (three) times daily as needed for muscle spasms. 90 tablet 0  . omeprazole (PRILOSEC) 20  MG capsule Take 1 capsule (20 mg total) by mouth daily. 30 capsule 4   No facility-administered medications prior to visit.    Allergies  Allergen Reactions  . Latex Swelling    ROS Review of Systems  Respiratory: Positive for shortness of breath and wheezing.        Exertion   Gastrointestinal: Positive for nausea.       Followed by GI has an appt April 5th  Genitourinary: Positive for vaginal discharge.  Musculoskeletal: Positive for back pain.  Neurological: Positive for dizziness.       If moves to fast   Psychiatric/Behavioral: The patient is nervous/anxious.        Depression  All other systems reviewed and are negative.     Objective:    Physical Exam Vitals reviewed.   Constitutional:      Appearance: She is obese.  HENT:     Head: Normocephalic.     Right Ear: Tympanic membrane and external ear normal.     Left Ear: Tympanic membrane and external ear normal.     Nose: Nose normal.  Eyes:     Extraocular Movements: Extraocular movements intact.  Cardiovascular:     Rate and Rhythm: Normal rate and regular rhythm.  Pulmonary:     Effort: Pulmonary effort is normal.     Breath sounds: Normal breath sounds.  Abdominal:     General: Bowel sounds are normal. There is distension.  Musculoskeletal:        General: Tenderness present.     Cervical back: Normal range of motion and neck supple.     Comments: Fire like going up her spine   Skin:    General: Skin is warm and dry.  Neurological:     Mental Status: She is alert and oriented to person, place, and time.  Psychiatric:        Mood and Affect: Mood normal.        Behavior: Behavior normal.        Thought Content: Thought content normal.        Judgment: Judgment normal.     Comments: Depression/anxiety      BP 115/88 (BP Location: Right Arm, Patient Position: Sitting, Cuff Size: Large)   Pulse 69   Temp (!) 97.5 F (36.4 C) (Temporal)   Ht 5\' 1"  (1.549 m)   Wt 206 lb 6.4 oz (93.6 kg)   LMP 04/17/2020 (Exact Date)   SpO2 97%   BMI 39.00 kg/m  Wt Readings from Last 3 Encounters:  05/18/20 206 lb 6.4 oz (93.6 kg)  04/23/20 203 lb (92.1 kg)  03/18/20 207 lb 12.8 oz (94.3 kg)     Health Maintenance Due  Topic Date Due  . Hepatitis C Screening  Never done  . COVID-19 Vaccine (1) Never done  . COLONOSCOPY (Pts 45-35yrs Insurance coverage will need to be confirmed)  Never done    There are no preventive care reminders to display for this patient.  Lab Results  Component Value Date   TSH 2.090 08/14/2018   Lab Results  Component Value Date   WBC 8.6 08/13/2019   HGB 9.6 (L) 08/13/2019   HCT 31.9 (L) 08/13/2019   MCV 78.8 (L) 08/13/2019   PLT 606 (H) 08/13/2019   Lab  Results  Component Value Date   NA 140 08/13/2019   K 4.0 08/13/2019   CO2 24 08/13/2019   GLUCOSE 102 (H) 08/13/2019   BUN <5 (L) 08/13/2019   CREATININE  0.76 08/13/2019   BILITOT 0.5 08/13/2019   ALKPHOS 46 08/13/2019   AST 22 08/13/2019   ALT 20 08/13/2019   PROT 7.0 08/13/2019   ALBUMIN 3.5 08/13/2019   CALCIUM 9.1 08/13/2019   ANIONGAP 9 08/13/2019   Lab Results  Component Value Date   CHOL 172 12/28/2017   Lab Results  Component Value Date   HDL 52 12/28/2017   Lab Results  Component Value Date   LDLCALC 106 (H) 12/28/2017   Lab Results  Component Value Date   TRIG 69 12/28/2017   Lab Results  Component Value Date   CHOLHDL 3.3 12/28/2017   Lab Results  Component Value Date   HGBA1C 5.5 05/18/2020      Assessment & Plan:  Stefan was seen today for bacteria vaginosis .  Diagnoses and all orders for this visit:  Spinal stenosis of lumbar region, unspecified whether neurogenic claudication present Question about physical therapy, what activities she can do and what should she not do -     Ambulatory referral to [place] Office Visit from 05/18/2020 in [place]  PHQ-9 Total Score 13    Requested therapy  -     Ambulatory referral to Psychiatry  Moderate episode of recurrent major depressive disorder ([place]) [place] Visit from 05/18/2020 in [facility]  PHQ-9 Total Score 13    Requested therapy  -     Ambulatory referral to Psychiatry  Vaginal discharge( recurrent BV changed soaps, eating ) -     Cervicovaginal ancillary only  Seasonal allergies  Use Flonase nasal spray for at least duration of your allergy season.  - For appropriate administration of the nasal spray, clear the nose, use opposite hand for opposite nare, sniff gently, exhale through your mouth. - For maximal effect take these two nasal sprays at least 30 minutes apart.  Claritin , as needed. -  Drink at least 64 ounces of water each day. --     fluticasone (FLONASE) 50 MCG/ACT nasal spray; Place 2 sprays into both nostrils daily. -     loratadine (CLARITIN) 10 MG tablet; Take 1 tablet (10 mg total) by mouth daily.  Benign paroxysmal positional vertigo due to bilateral vestibular disorder Positional - occurs when she changes positions from sitting to standing, than lying down to standing room spins- Advise to get barriers before changing positions.   Mixed stress and urge urinary incontinence  Spinal stenosis of lumbar region, unspecified whether neurogenic claudication present. Unclear if underlying cause. Kegel exercise help but problem remains present.  Diabetes mellitus screening -     HgB A1c 5.0   Breast tenderness in female Vitamin E 400mg  (2) daily   Other orders -     albuterol (VENTOLIN HFA) 108 (90 Base) MCG/ACT inhaler; Inhale 1-2 puffs into the lungs every 6 (six) hours as needed for wheezing or shortness of breath.     Follow-up: Return in about 3 months (around 08/18/2020) for f/u anxiety /depression.    Kerin Perna, NP

## 2020-05-19 ENCOUNTER — Other Ambulatory Visit (INDEPENDENT_AMBULATORY_CARE_PROVIDER_SITE_OTHER): Payer: 59

## 2020-05-19 LAB — CERVICOVAGINAL ANCILLARY ONLY
Bacterial Vaginitis (gardnerella): NEGATIVE
Candida Glabrata: NEGATIVE
Candida Vaginitis: NEGATIVE
Chlamydia: NEGATIVE
Comment: NEGATIVE
Comment: NEGATIVE
Comment: NEGATIVE
Comment: NEGATIVE
Comment: NEGATIVE
Comment: NORMAL
Neisseria Gonorrhea: NEGATIVE
Trichomonas: NEGATIVE

## 2020-05-20 ENCOUNTER — Other Ambulatory Visit (INDEPENDENT_AMBULATORY_CARE_PROVIDER_SITE_OTHER): Payer: 59

## 2020-05-20 ENCOUNTER — Other Ambulatory Visit: Payer: Self-pay

## 2020-05-20 DIAGNOSIS — N3946 Mixed incontinence: Secondary | ICD-10-CM

## 2020-05-20 DIAGNOSIS — F419 Anxiety disorder, unspecified: Secondary | ICD-10-CM

## 2020-05-20 DIAGNOSIS — Z13220 Encounter for screening for lipoid disorders: Secondary | ICD-10-CM

## 2020-05-20 DIAGNOSIS — N921 Excessive and frequent menstruation with irregular cycle: Secondary | ICD-10-CM

## 2020-05-20 DIAGNOSIS — F331 Major depressive disorder, recurrent, moderate: Secondary | ICD-10-CM

## 2020-05-20 DIAGNOSIS — J302 Other seasonal allergic rhinitis: Secondary | ICD-10-CM

## 2020-05-21 LAB — CBC WITH DIFFERENTIAL/PLATELET
Basophils Absolute: 0 10*3/uL (ref 0.0–0.2)
Basos: 0 %
EOS (ABSOLUTE): 0.3 10*3/uL (ref 0.0–0.4)
Eos: 4 %
Hematocrit: 32.2 % — ABNORMAL LOW (ref 34.0–46.6)
Hemoglobin: 10 g/dL — ABNORMAL LOW (ref 11.1–15.9)
Immature Grans (Abs): 0 10*3/uL (ref 0.0–0.1)
Immature Granulocytes: 0 %
Lymphocytes Absolute: 3.6 10*3/uL — ABNORMAL HIGH (ref 0.7–3.1)
Lymphs: 44 %
MCH: 25.3 pg — ABNORMAL LOW (ref 26.6–33.0)
MCHC: 31.1 g/dL — ABNORMAL LOW (ref 31.5–35.7)
MCV: 81 fL (ref 79–97)
Monocytes Absolute: 0.7 10*3/uL (ref 0.1–0.9)
Monocytes: 8 %
Neutrophils Absolute: 3.7 10*3/uL (ref 1.4–7.0)
Neutrophils: 44 %
Platelets: 459 10*3/uL — ABNORMAL HIGH (ref 150–450)
RBC: 3.96 x10E6/uL (ref 3.77–5.28)
RDW: 15.1 % (ref 11.7–15.4)
WBC: 8.3 10*3/uL (ref 3.4–10.8)

## 2020-05-21 LAB — CMP14+EGFR
ALT: 15 IU/L (ref 0–32)
AST: 19 IU/L (ref 0–40)
Albumin/Globulin Ratio: 1.5 (ref 1.2–2.2)
Albumin: 4.1 g/dL (ref 3.8–4.8)
Alkaline Phosphatase: 55 IU/L (ref 44–121)
BUN/Creatinine Ratio: 7 — ABNORMAL LOW (ref 9–23)
BUN: 5 mg/dL — ABNORMAL LOW (ref 6–24)
Bilirubin Total: 0.3 mg/dL (ref 0.0–1.2)
CO2: 23 mmol/L (ref 20–29)
Calcium: 9.1 mg/dL (ref 8.7–10.2)
Chloride: 100 mmol/L (ref 96–106)
Creatinine, Ser: 0.75 mg/dL (ref 0.57–1.00)
Globulin, Total: 2.8 g/dL (ref 1.5–4.5)
Glucose: 92 mg/dL (ref 65–99)
Potassium: 4.5 mmol/L (ref 3.5–5.2)
Sodium: 138 mmol/L (ref 134–144)
Total Protein: 6.9 g/dL (ref 6.0–8.5)
eGFR: 99 mL/min/{1.73_m2} (ref >59)

## 2020-05-21 LAB — LIPID PANEL
Chol/HDL Ratio: 2.9 ratio (ref 0.0–4.4)
Cholesterol, Total: 184 mg/dL (ref 100–199)
HDL: 63 mg/dL (ref >39)
LDL Chol Calc (NIH): 107 mg/dL — ABNORMAL HIGH (ref 0–99)
Triglycerides: 78 mg/dL (ref 0–149)
VLDL Cholesterol Cal: 14 mg/dL (ref 5–40)

## 2020-05-21 LAB — TSH+FREE T4
Free T4: 1.21 ng/dL (ref 0.82–1.77)
TSH: 2.06 u[IU]/mL (ref 0.450–4.500)

## 2020-05-25 ENCOUNTER — Other Ambulatory Visit: Payer: Self-pay

## 2020-05-25 ENCOUNTER — Ambulatory Visit (AMBULATORY_SURGERY_CENTER): Payer: 59 | Admitting: Gastroenterology

## 2020-05-25 ENCOUNTER — Encounter: Payer: Self-pay | Admitting: Gastroenterology

## 2020-05-25 VITALS — BP 146/94 | HR 70 | Temp 98.2°F | Resp 20 | Ht 61.0 in | Wt 203.0 lb

## 2020-05-25 DIAGNOSIS — K319 Disease of stomach and duodenum, unspecified: Secondary | ICD-10-CM | POA: Diagnosis not present

## 2020-05-25 DIAGNOSIS — K2980 Duodenitis without bleeding: Secondary | ICD-10-CM

## 2020-05-25 DIAGNOSIS — R197 Diarrhea, unspecified: Secondary | ICD-10-CM

## 2020-05-25 DIAGNOSIS — K635 Polyp of colon: Secondary | ICD-10-CM

## 2020-05-25 DIAGNOSIS — K5900 Constipation, unspecified: Secondary | ICD-10-CM | POA: Diagnosis not present

## 2020-05-25 DIAGNOSIS — K449 Diaphragmatic hernia without obstruction or gangrene: Secondary | ICD-10-CM

## 2020-05-25 DIAGNOSIS — K641 Second degree hemorrhoids: Secondary | ICD-10-CM | POA: Diagnosis not present

## 2020-05-25 DIAGNOSIS — K219 Gastro-esophageal reflux disease without esophagitis: Secondary | ICD-10-CM

## 2020-05-25 DIAGNOSIS — R194 Change in bowel habit: Secondary | ICD-10-CM

## 2020-05-25 DIAGNOSIS — K3189 Other diseases of stomach and duodenum: Secondary | ICD-10-CM

## 2020-05-25 MED ORDER — SODIUM CHLORIDE 0.9 % IV SOLN: 500.0000 mL | Freq: Once | INTRAVENOUS | Status: DC

## 2020-05-25 NOTE — Progress Notes (Signed)
Vs by CW in adm  Pt's states no medical or surgical changes since previsit or office visit.     

## 2020-05-25 NOTE — Op Note (Signed)
[place] Patient Name: Michele Trevino Procedure Date: 05/25/2020 2:48 PM MRN: [id number] Endoscopist: Justice Britain , MD Age: 45 Referring MD:  Date of Birth: 11-14-1975 Gender: Female Account #: [id number] Procedure:                Upper GI endoscopy Indications:              Indigestion, Dysphagia, Heartburn,                            Diarrhea/Constipation alternation, Early satiety,                            Nausea Medicines:                Monitored Anesthesia Care Procedure:                Pre-Anesthesia Assessment:                           - Prior to the procedure, a History and Physical                            was performed, and patient medications and                            allergies were reviewed. The patient's tolerance of                            previous anesthesia was also reviewed. The risks                            and benefits of the procedure and the sedation                            options and risks were discussed with the patient.                            All questions were answered, and informed consent                            was obtained. Prior Anticoagulants: The patient has                            taken no previous anticoagulant or antiplatelet                            agents except for NSAID medication. ASA Grade                            Assessment: II - A patient with mild systemic                            disease. After reviewing the risks and benefits,  the patient was deemed in satisfactory condition to                            undergo the procedure.                           After obtaining informed consent, the endoscope was                            passed under direct vision. Throughout the                            procedure, the patient's blood pressure, pulse, and                            oxygen saturations were monitored continuously. The                             Endoscope was introduced through the mouth, and                            advanced to the second part of duodenum. The upper                            GI endoscopy was accomplished without difficulty.                            The patient tolerated the procedure. Scope In: Scope Out: Findings:                 No gross lesions were noted in the entire                            esophagus. Biopsies were taken with a cold forceps                            for histology to rule out EoE/LoE. After the rest                            of the EGD was complete a guidewire was placed and                            the scope was withdrawn. Dilation was performed                            with a Savary dilator with mild resistance at 18                            mm. The dilation site was examined following                            endoscope reinsertion and showed mild mucosal  disruption/mucosal wrent in the region just distal                            to UES, moderate improvement in luminal narrowing                            and no perforation.                           The Z-line was regular and was found 38 cm from the                            incisors.                           A 1 cm hiatal hernia was present.                           No other gross lesions were noted in the entire                            examined stomach. Biopsies were taken with a cold                            forceps for histology and Helicobacter pylori                            testing.                           No gross lesions were noted in the duodenal bulb,                            in the first portion of the duodenum and in the                            second portion of the duodenum. Biopsies for                            histology were taken with a cold forceps for                            evaluation of celiac disease. Complications:            No immediate  complications. Estimated blood loss:                            None. Estimated Blood Loss:     Estimated blood loss was minimal. Impression:               - No gross lesions in esophagus. Biopsied. Dilated                            with mucosal wrent in the region distal to UES.                           -  Z-line regular, 38 cm from the incisors.                           - 1 cm hiatal hernia present.                           - No other gross lesions in the stomach. Biopsied.                           - No gross lesions in the duodenal bulb, in the                            first portion of the duodenum and in the second                            portion of the duodenum. Biopsied. Recommendation:           - Proceed to scheduled colonoscopy.                           - Continue present medications.                           - Await pathology results.                           - If true dysphagia symptoms persist post dilation                            then will need to consider Manometry.                           - Recommend PPI 40 mg twice daily for 1 month and                            then transition to PO PPI daily thereafter to                            improve healing post-dilation.                           - If GERD symptoms are persisting then transition                            to PO Dexilant or Aciphex.                           - If GERD-like symptoms persist post procedure with                            PPI transition then pH Impedence testing should be                            considered off PPI.                           -  The findings and recommendations were discussed                            with the patient.                           - The findings and recommendations were discussed                            with the patient's family. Justice Britain, MD 05/25/2020 4:01:02 PM

## 2020-05-25 NOTE — Op Note (Signed)
[place] Patient Name: Michele Trevino Procedure Date: 05/25/2020 2:57 PM MRN: [id number] Endoscopist: Justice Britain , MD Age: 45 Referring MD:  Date of Birth: [birth year] Gender: Female Account #: [id number] Procedure:                Colonoscopy Indications:              Screening for colorectal malignant neoplasm,                            Incidental - Exclusion of microscopic colitis,                            Incidental - Change in bowel habits, Incidental -                            Constipation/Incidental - Diarrhea alternation Medicines:                Monitored Anesthesia Care Procedure:                Pre-Anesthesia Assessment:                           - Prior to the procedure, a History and Physical                            was performed, and patient medications and                            allergies were reviewed. The patient's tolerance of                            previous anesthesia was also reviewed. The risks                            and benefits of the procedure and the sedation                            options and risks were discussed with the patient.                            All questions were answered, and informed consent                            was obtained. Prior Anticoagulants: The patient has                            taken no previous anticoagulant or antiplatelet                            agents except for NSAID medication. ASA Grade                            Assessment: II - A patient with mild systemic  disease. After reviewing the risks and benefits,                            the patient was deemed in satisfactory condition to                            undergo the procedure.                           After obtaining informed consent, the colonoscope                            was passed under direct vision. Throughout the                            procedure, the patient's blood  pressure, pulse, and                            oxygen saturations were monitored continuously. The                            Olympus CF-HQ190L (Serial# [id number]) Colonoscope was                            introduced through the anus and advanced to the 8                            cm into the ileum. The colonoscopy was performed                            without difficulty. The patient tolerated the                            procedure. The quality of the bowel preparation was                            good. The terminal ileum, ileocecal valve,                            appendiceal orifice, and rectum were photographed. Scope In: 3:29:32 PM Scope Out: 3:51:54 PM Scope Withdrawal Time: 0 hours 18 minutes 3 seconds  Total Procedure Duration: 0 hours 22 minutes 22 seconds  Findings:                 The digital rectal exam findings include                            hemorrhoids. Pertinent negatives include no                            palpable rectal lesions.                           The terminal ileum appeared normal. Biopsies were  taken with a cold forceps for histology.                           One 12 mm submucosal nodule was found in the                            transverse colon. This was pillow-test negative.                            This was tunnel biopsied with a cold forceps for                            histology to try to obtain a diagnosis. Tattoo on                            contralateral wall for marking purposes.                           Normal mucosa was found in the entire colon                            otherwise. Biopsies for histology were taken with a                            cold forceps from the entire colon for evaluation                            of microscopic colitis.                           Non-bleeding non-thrombosed external and internal                            hemorrhoids were found during retroflexion, during                             perianal exam and during digital exam. The                            hemorrhoids were Grade II (internal hemorrhoids                            that prolapse but reduce spontaneously). Complications:            No immediate complications. Estimated Blood Loss:     Estimated blood loss was minimal. Impression:               - Hemorrhoids found on digital rectal exam.                           - The examined portion of the ileum was normal.                            Biopsied.                           -  Submucosal nodule in the transverse colon -                            tunnel biopsied. Trattoo on contralateral wall                            placed.                           - Normal mucosa in the entire examined colon                            otherwise. Biopsied.                           - Non-bleeding non-thrombosed external and internal                            hemorrhoids. Recommendation:           - The patient will be observed post-procedure,                            until all discharge criteria are met.                           - Discharge patient to home.                           - Patient has a contact number available for                            emergencies. The signs and symptoms of potential                            delayed complications were discussed with the                            patient. Return to normal activities tomorrow.                            Written discharge instructions were provided to the                            patient.                           - High fiber diet.                           - Use FiberCon 1-2 tablets PO daily.                           - Continue present medications.                           - Await pathology results.                           -  If tunneled biopsies are negative for lipoma or                            distinct diagnosis then would recommend CTAP with                             IV/PO contrast to try to define if this is a smooth                            muscle lesion. If this shows evidence of a lesion                            then proceed with follow up thereafter, otherwise,                            if negative, then would consider repeat Colonoscopy                            in 1-2 years with EUS to re-evaluate the site and                            ensure overall stability and try to establish a                            diagnosis (though tissue diagnosis not likely as a                            result of the region/location but rather wall-layer                            lesion distinction).                           - If this submucosal lesion was not present then                            repeat colonoscopy would not be recommended for                            10-years.                           - The findings and recommendations were discussed                            with the patient.                           - The findings and recommendations were discussed                            with the patient's family. Justice Britain, MD 05/25/2020 4:08:09 PM

## 2020-05-25 NOTE — Patient Instructions (Signed)
Please read handouts provided. Continue present medications. Await pathology results. Take Protonix 40 mg twice daily for one month, then transition to Protonix 40 mg once daily. If symptoms persist, contact Dr. Rush Landmark to switch to new medication ( Dexilant or Aciphex ). Dilation Diet protocol. Use FiberCon 1-2 tablets daily. High Fiber Diet.       YOU HAD AN ENDOSCOPIC PROCEDURE TODAY AT [facility]:   Refer to the procedure report that was given to you for any specific questions about what was found during the examination.  If the procedure report does not answer your questions, please call your gastroenterologist to clarify.  If you requested that your care partner not be given the details of your procedure findings, then the procedure report has been included in a sealed envelope for you to review at your convenience later.  YOU SHOULD EXPECT: Some feelings of bloating in the abdomen. Passage of more gas than usual.  Walking can help get rid of the air that was put into your GI tract during the procedure and reduce the bloating. If you had a lower endoscopy (such as a colonoscopy or flexible sigmoidoscopy) you may notice spotting of blood in your stool or on the toilet paper. If you underwent a bowel prep for your procedure, you may not have a normal bowel movement for a few days.  Please Note:  You might notice some irritation and congestion in your nose or some drainage.  This is from the oxygen used during your procedure.  There is no need for concern and it should clear up in a day or so.  SYMPTOMS TO REPORT IMMEDIATELY:   Following lower endoscopy (colonoscopy or flexible sigmoidoscopy):  Excessive amounts of blood in the stool  Significant tenderness or worsening of abdominal pains  Swelling of the abdomen that is new, acute  Fever of 100F or higher   Following upper endoscopy (EGD)  Vomiting of blood or coffee ground material  New chest pain or pain  under the shoulder blades  Painful or persistently difficult swallowing  New shortness of breath  Fever of 100F or higher  Black, tarry-looking stools  For urgent or emergent issues, a gastroenterologist can be reached at any hour by calling [phone]. Do not use MyChart messaging for urgent concerns.    DIET:  Drink plenty of fluids but you should avoid alcoholic beverages for 24 hours.  ACTIVITY:  You should plan to take it easy for the rest of today and you should NOT DRIVE or use heavy machinery until tomorrow (because of the sedation medicines used during the test).    FOLLOW UP: Our staff will call the number listed on your records 48-72 hours following your procedure to check on you and address any questions or concerns that you may have regarding the information given to you following your procedure. If we do not reach you, we will leave a message.  We will attempt to reach you two times.  During this call, we will ask if you have developed any symptoms of COVID 19. If you develop any symptoms (ie: fever, flu-like symptoms, shortness of breath, cough etc.) before then, please call [phone].  If you test positive for Covid 19 in the 2 weeks post procedure, please call and report this information to [place].    If any biopsies were taken you will be contacted by phone or by letter within the next 1-3 weeks.  Please call us at [phone] if you have  not heard about the biopsies in 3 weeks.    SIGNATURES/CONFIDENTIALITY: You and/or your care partner have signed paperwork which will be entered into your electronic medical record.  These signatures attest to the fact that that the information above on your After Visit Summary has been reviewed and is understood.  Full responsibility of the confidentiality of this discharge information lies with you and/or your care-partner.

## 2020-05-25 NOTE — Progress Notes (Signed)
PT taken to PACU. Monitors in place. VSS. Report given to RN. 

## 2020-05-25 NOTE — Progress Notes (Signed)
Called to room to assist during endoscopic procedure.  Patient ID and intended procedure confirmed with present staff. Received instructions for my participation in the procedure from the performing physician.  

## 2020-05-27 ENCOUNTER — Telehealth: Payer: Self-pay | Admitting: *Deleted

## 2020-05-27 NOTE — Telephone Encounter (Signed)
  Follow up Call-  Call back number 05/25/2020  Post procedure Call Back phone  # [phone]  Permission to leave phone message Yes  Some recent data might be hidden     Patient questions:  Do you have a fever, pain , or abdominal swelling? No. Pain Score  0 *  Have you tolerated food without any problems? Yes.    Have you been able to return to your normal activities? Yes.    Do you have any questions about your discharge instructions: Diet   No. Medications  No. Follow up visit  No.  Do you have questions or concerns about your Care? No.  Actions: * If pain score is 4 or above: No action needed, pain <4.  1. Have you developed a fever since your procedure? no  2.   Have you had an respiratory symptoms (SOB or cough) since your procedure? no  3.   Have you tested positive for COVID 19 since your procedure no  4.   Have you had any family members/close contacts diagnosed with the COVID 19 since your procedure?  no   If yes to any of these questions please route to Joylene John, RN and Joella Prince, RN

## 2020-06-07 ENCOUNTER — Encounter: Payer: Self-pay | Admitting: Gastroenterology

## 2020-06-07 ENCOUNTER — Other Ambulatory Visit: Payer: Self-pay

## 2020-06-07 ENCOUNTER — Telehealth: Payer: Self-pay | Admitting: Gastroenterology

## 2020-06-07 DIAGNOSIS — K639 Disease of intestine, unspecified: Secondary | ICD-10-CM

## 2020-06-07 NOTE — Telephone Encounter (Signed)
See alternate note  

## 2020-06-07 NOTE — Telephone Encounter (Signed)
Inbound call from patient returning your call. Best contact number [phone]

## 2020-06-10 ENCOUNTER — Other Ambulatory Visit: Payer: Self-pay

## 2020-06-10 ENCOUNTER — Ambulatory Visit (HOSPITAL_COMMUNITY)
Admission: RE | Admit: 2020-06-10 | Discharge: 2020-06-10 | Disposition: A | Discharge status: Home / Self Care | Payer: 59 | Source: Ambulatory Visit | Attending: Gastroenterology | Admitting: Gastroenterology

## 2020-06-10 ENCOUNTER — Encounter (HOSPITAL_COMMUNITY): Payer: Self-pay

## 2020-06-10 DIAGNOSIS — K639 Disease of intestine, unspecified: Secondary | ICD-10-CM | POA: Diagnosis present

## 2020-06-10 MED ORDER — IOHEXOL 300 MG/ML  SOLN
100.0000 mL | Freq: Once | INTRAMUSCULAR | Status: AC | PRN
  Administered 2020-06-10: 100 mL via INTRAVENOUS

## 2020-07-06 ENCOUNTER — Ambulatory Visit (INDEPENDENT_AMBULATORY_CARE_PROVIDER_SITE_OTHER): Payer: 59 | Admitting: Primary Care

## 2020-07-28 ENCOUNTER — Encounter (INDEPENDENT_AMBULATORY_CARE_PROVIDER_SITE_OTHER): Payer: Self-pay | Admitting: Primary Care

## 2020-07-28 ENCOUNTER — Other Ambulatory Visit: Payer: Self-pay

## 2020-07-28 ENCOUNTER — Ambulatory Visit (INDEPENDENT_AMBULATORY_CARE_PROVIDER_SITE_OTHER): Payer: 59 | Admitting: Primary Care

## 2020-07-28 VITALS — BP 138/86 | HR 69 | Temp 97.9°F | Ht 61.0 in | Wt 197.4 lb

## 2020-07-28 DIAGNOSIS — R911 Solitary pulmonary nodule: Secondary | ICD-10-CM | POA: Diagnosis not present

## 2020-07-28 NOTE — Progress Notes (Signed)
Discuss pulmonary nodule found on endoscopy

## 2020-07-28 NOTE — Progress Notes (Signed)
Acute Office Visit  Subjective:    Patient ID: Michele Trevino, female    DOB: [birth year], 45 y.o.   MRN: [id number]  Chief Complaint  Patient presents with  . Pelvic Pain    Left inner pelvic area     HPI Ms. Jamine Wingate. Borunda is a 45 year old obese female who presents today following in an endoscopic procedure-which revealed a lung nodule and GI doctor was ask what does this mean she was told to follow-up with her PCP.  Patient gave permission to speak with pulmonologist for clarity.  Past Medical History:  Diagnosis Date  . Arthritis   . Asthma   . Carpal tunnel syndrome   . Morbid obesity ([place])     Past Surgical History:  Procedure Laterality Date  . CESAREAN SECTION  [id number]   x 3  . TUBAL LIGATION      Family History  Problem Relation Age of Onset  . Lung cancer Mother   . Diabetes Father   . Hypertension Father   . Throat cancer Maternal Aunt     Social History   Socioeconomic History  . Marital status: Married    Spouse name: Not on file  . Number of children: 4  . Years of education: Not on file  . Highest education level: Some college, no degree  Occupational History    Comment: NA  Tobacco Use  . Smoking status: Former Smoker    Quit date: 2005    Years since quitting: 17.4  . Smokeless tobacco: Never Used  Vaping Use  . Vaping Use: Never used  Substance and Sexual Activity  . Alcohol use: Yes    Comment: socially   . Drug use: No  . Sexual activity: Yes    Birth control/protection: Surgical  Other Topics Concern  . Not on file  Social History Narrative   Lives with family   Social Determinants of Health   Financial Resource Strain: Not on file  Food Insecurity: Not on file  Transportation Needs: No Transportation Needs  . Lack of Transportation (Medical): No  . Lack of Transportation (Non-Medical): No  Physical Activity: Not on file  Stress: Not on file  Social Connections: Not on file  Intimate Partner  Violence: Not on file    Outpatient Medications Prior to Visit  Medication Sig Dispense Refill  . albuterol (PROVENTIL) (2.5 MG/3ML) 0.083% nebulizer solution Take 3 mLs (2.5 mg total) by nebulization every 6 (six) hours as needed for wheezing or shortness of breath. 75 mL 12  . albuterol (VENTOLIN HFA) 108 (90 Base) MCG/ACT inhaler Inhale 1-2 puffs into the lungs every 6 (six) hours as needed for wheezing or shortness of breath. 8 g 1  . fluticasone (FLONASE) 50 MCG/ACT nasal spray Place 2 sprays into both nostrils daily. 16 g 6  . loratadine (CLARITIN) 10 MG tablet Take 1 tablet (10 mg total) by mouth daily. 30 tablet 11  . oxybutynin (DITROPAN XL) 10 MG 24 hr tablet Take 1 tablet (10 mg total) by mouth at bedtime. (Patient not taking: No sig reported) 90 tablet 1  . pantoprazole (PROTONIX) 40 MG tablet Take 1 tablet (40 mg total) by mouth 2 (two) times daily. Take one tablet 30-60 minutes before breakfast and dinner. (Patient not taking: No sig reported) 30 tablet 5  . ibuprofen (ADVIL) 600 MG tablet Take 1 tablet (600 mg total) by mouth every 6 (six) hours as needed. (Patient not taking: No sig reported) 30 tablet 0  No facility-administered medications prior to visit.    Allergies  Allergen Reactions  . Latex Swelling    Review of Systems  All other systems reviewed and are negative.      Objective:    Physical Exam Vitals reviewed.  Constitutional:      Appearance: She is obese.  HENT:     Head: Normocephalic.     Right Ear: Tympanic membrane and external ear normal.     Left Ear: Tympanic membrane and external ear normal.     Nose: Nose normal.  Eyes:     Extraocular Movements: Extraocular movements intact.     Pupils: Pupils are equal, round, and reactive to light.  Cardiovascular:     Rate and Rhythm: Normal rate and regular rhythm.  Pulmonary:     Effort: Pulmonary effort is normal.     Breath sounds: Normal breath sounds.  Abdominal:     General: Bowel sounds  are normal. There is distension.     Palpations: Abdomen is soft.  Musculoskeletal:        General: Normal range of motion.     Cervical back: Normal range of motion and neck supple.  Skin:    General: Skin is warm.  Neurological:     Mental Status: She is oriented to person, place, and time.  Psychiatric:        Mood and Affect: Mood normal.        Behavior: Behavior normal.        Thought Content: Thought content normal.        Judgment: Judgment normal.     BP 138/86 (BP Location: Right Arm, Patient Position: Sitting, Cuff Size: Large)   Pulse 69   Temp 97.9 F (36.6 C) (Temporal)   Ht _0  (1.549 m)   Wt 197 lb 6.4 oz (89.5 kg)   SpO2 96%   BMI 37.30 kg/m  Wt Readings from Last 3 Encounters:  07/28/20 197 lb 6.4 oz (89.5 kg)  05/25/20 203 lb (92.1 kg)  05/18/20 206 lb 6.4 oz (93.6 kg)    Health Maintenance Due  Topic Date Due  . COVID-19 Vaccine (1) Never done  . Pneumococcal Vaccine 54-74 Years old (1 of 2 - PPSV23) Never done  . Hepatitis C Screening  Never done    There are no preventive care reminders to display for this patient.   Lab Results  Component Value Date   TSH 2.060 05/20/2020   Lab Results  Component Value Date   WBC 8.3 05/20/2020   HGB 10.0 (L) 05/20/2020   HCT 32.2 (L) 05/20/2020   MCV 81 05/20/2020   PLT 459 (H) 05/20/2020   Lab Results  Component Value Date   NA 138 05/20/2020   K 4.5 05/20/2020   CO2 23 05/20/2020   GLUCOSE 92 05/20/2020   BUN 5 (L) 05/20/2020   CREATININE 0.75 05/20/2020   BILITOT 0.3 05/20/2020   ALKPHOS 55 05/20/2020   AST 19 05/20/2020   ALT 15 05/20/2020   PROT 6.9 05/20/2020   ALBUMIN 4.1 05/20/2020   CALCIUM 9.1 05/20/2020   ANIONGAP 9 08/13/2019   EGFR 99 05/20/2020   Lab Results  Component Value Date   CHOL 184 05/20/2020   Lab Results  Component Value Date   HDL 63 05/20/2020   Lab Results  Component Value Date   LDLCALC 107 (H) 05/20/2020   Lab Results  Component Value Date    TRIG 78 05/20/2020   Lab Results  Component Value Date   CHOLHDL 2.9 05/20/2020   Lab Results  Component Value Date   HGBA1C 5.5 05/18/2020       Assessment & Plan:  Shanielle was seen today for pelvic pain.  Diagnoses and all orders for this visit:  Lung nodule Unable to explain why the nodule has been present since 2019 and she was not made aware of this.  Did explain PCP responsible for what is at present-call pulmonologist was advised to get a CT scanning without contrast of the chest to determine if there is more than the one nodule seen which measured 67m.  Explained to patient when results came back would like to consult back with her pulmonologist for explanation, diagnosis, treatment plan and referral to pulmonology. -     CT Chest Wo Contrast; Future Education / consult spent > 45 mins   No orders of the defined types were placed in this encounter.    MKerin Perna NP

## 2020-08-11 ENCOUNTER — Telehealth (INDEPENDENT_AMBULATORY_CARE_PROVIDER_SITE_OTHER): Payer: Self-pay | Admitting: Primary Care

## 2020-08-11 NOTE — Telephone Encounter (Signed)
Called regarding an order for a CT chest scan for patient.  Please call to discuss at [phone]

## 2020-08-13 ENCOUNTER — Other Ambulatory Visit: Payer: 59

## 2020-08-17 NOTE — Telephone Encounter (Signed)
Spoke with Michele Trevino from radiology CT was suggested by GI . Needs prior authorization read GI note she will submit based on imaging and f/u if denied

## 2020-09-13 ENCOUNTER — Ambulatory Visit (INDEPENDENT_AMBULATORY_CARE_PROVIDER_SITE_OTHER): Payer: Self-pay | Admitting: *Deleted

## 2020-09-13 NOTE — Telephone Encounter (Signed)
   Pt reports sinus pain, pressure "Between eyes." Also reports right eye swollen at lid, red and "Crusty in the mornings." Reports runny nose, clear, "Runny on one side and stuffy on the other." Rates sinus pressure at 4/10.Denies fever. Pt states H/O sinus infections "And this feels like one." States more concerned about eye. No availability at practice until 09/24/20. Home care advise given. Assured pt NT would route to practice for PCPs review and final disposition. Advised UC if symptoms worsen. Also advised Virtual Appt at [facility] site. Pt verbalizes understanding.   Reason for Disposition  [1] Sinus congestion as part of a cold AND [2] present < 10 days  Answer Assessment - Initial Assessment Questions 1. LOCATION: "Where does it hurt?"      Around eyes and forehead 2. ONSET: "When did the sinus pain start?"  (e.g., hours, days)      Friday 3. SEVERITY: "How bad is the pain?"   (Scale 1-10; mild, moderate or severe)   - MILD (1-3): doesn't interfere with normal activities    - MODERATE (4-7): interferes with normal activities (e.g., work or school) or awakens from sleep   - SEVERE (8-10): excruciating pain and patient unable to do any normal activities        4/10 4. RECURRENT SYMPTOM: "Have you ever had sinus problems before?" If Yes, ask: "When was the last time?" and "What happened that time?"      yes 5. NASAL CONGESTION: "Is the nose blocked?" If Yes, ask: "Can you open it or must you breathe through your mouth?"     Stuffy 6. NASAL DISCHARGE: "Do you have discharge from your nose?" If so ask, "What color?"     clear 7. FEVER: "Do you have a fever?" If Yes, ask: "What is it, how was it measured, and when did it start?"      no 8. OTHER SYMPTOMS: "Do you have any other symptoms?" (e.g., sore throat, cough, earache, difficulty breathing)     Runny nose, cough, right eye red and swollen at lid.  Protocols used: Sinus Pain or Congestion-A-AH

## 2020-09-13 NOTE — Telephone Encounter (Signed)
Third attempt and 3rd voice mail. Advised pt to call office number for questions or to be triaged.

## 2020-09-13 NOTE — Telephone Encounter (Signed)
Summary: advice   Pt called in stating she thinks she may have a sinus infection but that her eye is also red and swollen. No appts with Juluis Mire until 8/5, pt needed advice on what to do next.     Attempted to call patient- left message to call back.

## 2020-09-14 ENCOUNTER — Other Ambulatory Visit (INDEPENDENT_AMBULATORY_CARE_PROVIDER_SITE_OTHER): Payer: Self-pay | Admitting: Primary Care

## 2020-09-14 DIAGNOSIS — R911 Solitary pulmonary nodule: Secondary | ICD-10-CM

## 2020-09-14 DIAGNOSIS — J302 Other seasonal allergic rhinitis: Secondary | ICD-10-CM

## 2020-09-14 MED ORDER — CETIRIZINE-PSEUDOEPHEDRINE ER 5-120 MG PO TB12: 1.0000 | ORAL_TABLET | Freq: Two times a day (BID) | ORAL | 1 refills | Status: AC

## 2020-09-14 MED ORDER — FLUTICASONE PROPIONATE 50 MCG/ACT NA SUSP
2.0000 | Freq: Every day | NASAL | 6 refills | Status: AC
Start: 2020-09-14 — End: ?

## 2020-09-14 NOTE — Telephone Encounter (Signed)
Spoke with patient seasonal allergies s/s sent in medication. Did not tx red burning eye without evaluating explain use wet warm compress if drainage call office

## 2020-10-04 ENCOUNTER — Ambulatory Visit (HOSPITAL_COMMUNITY)
Admission: EM | Admit: 2020-10-04 | Discharge: 2020-10-04 | Disposition: A | Discharge status: Home / Self Care | Payer: 59 | Attending: Internal Medicine | Admitting: Internal Medicine

## 2020-10-04 ENCOUNTER — Encounter (HOSPITAL_COMMUNITY): Payer: Self-pay

## 2020-10-04 ENCOUNTER — Other Ambulatory Visit: Payer: Self-pay

## 2020-10-04 DIAGNOSIS — N390 Urinary tract infection, site not specified: Secondary | ICD-10-CM | POA: Insufficient documentation

## 2020-10-04 HISTORY — DX: Spinal stenosis, site unspecified: M48.00

## 2020-10-04 LAB — POCT URINALYSIS DIPSTICK, ED / UC
Bilirubin Urine: NEGATIVE
Glucose, UA: NEGATIVE mg/dL
Ketones, ur: NEGATIVE mg/dL
Nitrite: NEGATIVE
Protein, ur: 30 mg/dL — AB
Specific Gravity, Urine: 1.02 (ref 1.005–1.030)
Urobilinogen, UA: 0.2 mg/dL (ref 0.0–1.0)
pH: 7 (ref 5.0–8.0)

## 2020-10-04 LAB — POC URINE PREG, ED: Preg Test, Ur: NEGATIVE

## 2020-10-04 MED ORDER — CEPHALEXIN 500 MG PO CAPS: 500.0000 mg | ORAL_CAPSULE | Freq: Two times a day (BID) | ORAL | 0 refills | Status: AC

## 2020-10-04 NOTE — ED Triage Notes (Addendum)
Pt c/o possible UTI, states painful urination, urinary urgency, and lower abd pain.  Started: 1 Week ago

## 2020-10-04 NOTE — Discharge Instructions (Addendum)
Increase oral fluid intake Take medications as prescribed We will call you with recommendations if your labs are abnormal You can review your results in MyChart. Do not hesitate to reach out to the urgent care if you have any worsening symptoms or have further questions.

## 2020-10-04 NOTE — ED Provider Notes (Signed)
[facility]    CSN: [id number] Arrival date & time: 10/04/20  0805      History   Chief Complaint Chief Complaint  Patient presents with   Urinary Frequency    HPI Michele Trevino is a 45 y.o. female comes to the urgent care with 1 week history of dysuria, urgency or frequency.  Patient has a history of spinal stenosis and chronic back pain.  She denies any nausea or vomiting.  No fever or chills.  No flank pain.  No difficulty with bowel movement.  No vaginal discharge or itching.  Patient's menstrual period ended last week. HPI  Past Medical History:  Diagnosis Date   Arthritis    Asthma    Carpal tunnel syndrome    Morbid obesity ([place])    Spinal stenosis     Patient Active Problem List   Diagnosis Date Noted   GERD (gastroesophageal reflux disease) 07/28/2019   History of COVID-19 07/02/2019   Anxiety 07/02/2019   Bronchiolitis 07/02/2019   Acute non-recurrent maxillary sinusitis 07/02/2019   Morbid obesity ([place])    Mixed incontinence 03/21/2019   UTERINE FIBROID 04/19/2006    Past Surgical History:  Procedure Laterality Date   CESAREAN SECTION  [phone]   x 3   TUBAL LIGATION      OB History     Gravida  8   Para  4   Term      Preterm      AB  4   Living  4      SAB      IAB      Ectopic      Multiple      Live Births  4            Home Medications    Prior to Admission medications   Medication Sig Start Date End Date Taking? Authorizing Provider  cephALEXin (KEFLEX) 500 MG capsule Take 1 capsule (500 mg total) by mouth 2 (two) times daily for 5 days. 10/04/20 10/09/20 Yes Yarelin Reichardt, Myrene Galas, MD  albuterol (PROVENTIL) (2.5 MG/3ML) 0.083% nebulizer solution Take 3 mLs (2.5 mg total) by nebulization every 6 (six) hours as needed for wheezing or shortness of breath. 07/02/19 07/01/20  Elsie Stain, MD  albuterol (VENTOLIN HFA) 108 (90 Base) MCG/ACT inhaler Inhale 1-2 puffs into the lungs every 6 (six)  hours as needed for wheezing or shortness of breath. 05/18/20   Kerin Perna, NP  cetirizine-pseudoephedrine (ZYRTEC-D ALLERGY & CONGESTION) 5-120 MG tablet Take 1 tablet by mouth 2 (two) times daily. 09/14/20   Kerin Perna, NP  fluticasone (FLONASE) 50 MCG/ACT nasal spray Place 2 sprays into both nostrils daily. 09/14/20   Kerin Perna, NP    Family History Family History  Problem Relation Age of Onset   Lung cancer Mother    Diabetes Father    Hypertension Father    Throat cancer Maternal Aunt     Social History Social History   Tobacco Use   Smoking status: Former    Types: Cigarettes    Quit date: 2005    Years since quitting: 17.6   Smokeless tobacco: Never  Vaping Use   Vaping Use: Never used  Substance Use Topics   Alcohol use: Yes    Comment: socially    Drug use: No     Allergies   Latex   Review of Systems Review of Systems  Constitutional: Negative.   Gastrointestinal: Negative.   Genitourinary:  Positive for dysuria, frequency and urgency. Negative for flank pain, vaginal bleeding, vaginal discharge and vaginal pain.  Musculoskeletal:  Positive for back pain.  Neurological: Negative.     Physical Exam Triage Vital Signs ED Triage Vitals  Enc Vitals Group     BP 10/04/20 0837 125/84     Pulse Rate 10/04/20 0837 62     Resp 10/04/20 0837 18     Temp 10/04/20 0837 98.1 F (36.7 C)     Temp Source 10/04/20 0837 Oral     SpO2 10/04/20 0837 100 %     Weight --      Height --      Head Circumference --      Peak Flow --      Pain Score 10/04/20 0834 5     Pain Loc --      Pain Edu? --      Excl. in [place]? --    No data found.  Updated Vital Signs BP 125/84 (BP Location: Right Arm)   Pulse 62   Temp 98.1 F (36.7 C) (Oral)   Resp 18   LMP 09/27/2020   SpO2 100%   Visual Acuity Right Eye Distance:   Left Eye Distance:   Bilateral Distance:    Right Eye Near:   Left Eye Near:    Bilateral Near:     Physical  Exam Vitals and nursing note reviewed.  Cardiovascular:     Rate and Rhythm: Normal rate and regular rhythm.  Abdominal:     Palpations: Abdomen is soft.     Tenderness: There is no abdominal tenderness. There is no guarding or rebound.  Musculoskeletal:        General: Normal range of motion.  Skin:    General: Skin is warm.     UC Treatments / Results  Labs (all labs ordered are listed, but only abnormal results are displayed) Labs Reviewed  POCT URINALYSIS DIPSTICK, ED / UC - Abnormal; Notable for the following components:      Result Value   Hgb urine dipstick LARGE (*)    Protein, ur 30 (*)    Leukocytes,Ua LARGE (*)    All other components within normal limits  URINE CULTURE  POC URINE PREG, ED    EKG   Radiology No results found.  Procedures Procedures (including critical care time)  Medications Ordered in UC Medications - No data to display  Initial Impression / Assessment and Plan / UC Course  I have reviewed the triage vital signs and the nursing notes.  Pertinent labs & imaging results that were available during my care of the patient were reviewed by me and considered in my medical decision making (see chart for details).     1.  Urinary tract infection: Point-of-care urinalysis is significant for leukocyte Estrace, hemoglobin and protein. Urine cultures will be sent Keflex 500 mg twice daily Increase oral fluid intake We will call you with recommendations if urine cultures require antibiotic changes Return precautions given Final Clinical Impressions(s) / UC Diagnoses   Final diagnoses:  Lower urinary tract infectious disease     Discharge Instructions      Increase oral fluid intake Take medications as prescribed We will call you with recommendations if your labs are abnormal You can review your results in MyChart. Do not hesitate to reach out to the urgent care if you have any worsening symptoms or have further questions.   ED  Prescriptions     Medication Sig  Dispense Auth. Provider   cephALEXin (KEFLEX) 500 MG capsule Take 1 capsule (500 mg total) by mouth 2 (two) times daily for 5 days. 10 capsule Relena Ivancic, Myrene Galas, MD      PDMP not reviewed this encounter.   Chase Picket, MD 10/04/20 1128

## 2020-10-05 LAB — URINE CULTURE

## 2020-10-11 ENCOUNTER — Ambulatory Visit (INDEPENDENT_AMBULATORY_CARE_PROVIDER_SITE_OTHER): Payer: Self-pay | Admitting: *Deleted
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
## 2020-10-11 NOTE — Telephone Encounter (Signed)
I have sent a high priority message to [place] for Juluis Mire, NP at pt's request.   Pt wants Sharyn Lull to call her.   [phone].

## 2020-10-11 NOTE — Telephone Encounter (Signed)
Sent to PCP ?

## 2020-10-12 ENCOUNTER — Ambulatory Visit (HOSPITAL_COMMUNITY)
Admission: EM | Admit: 2020-10-12 | Discharge: 2020-10-12 | Disposition: A | Discharge status: Home / Self Care | Payer: 59

## 2020-10-12 ENCOUNTER — Encounter (HOSPITAL_COMMUNITY): Payer: Self-pay

## 2020-10-12 ENCOUNTER — Other Ambulatory Visit: Payer: Self-pay

## 2020-10-12 DIAGNOSIS — K642 Third degree hemorrhoids: Secondary | ICD-10-CM | POA: Diagnosis not present

## 2020-10-12 DIAGNOSIS — K6289 Other specified diseases of anus and rectum: Secondary | ICD-10-CM

## 2020-10-12 NOTE — Discharge Instructions (Addendum)
Go to [place] [facility] today at 330 and they can evaluate the area. They may or may not be able to open and drain.  Take tylenol as needed for pain  Cont warms sits and prep cream as needed

## 2020-10-12 NOTE — ED Triage Notes (Signed)
Pt in with c/o possible abscess or hemorrhoid. States she doesn't know which one it is.  Pt states the area is harder than a hemorrhoid which she has had before. C/o difficulty walking, sitting, and sleeping due to pain.

## 2020-10-12 NOTE — ED Provider Notes (Signed)
[facility]    CSN: [id number] Arrival date & time: 10/12/20  [id number]      History   Chief Complaint Chief Complaint  Patient presents with   Abscess    HPI Michele Trevino is a 45 y.o. female.   Pt is here for a rectal hemorrhoid for approx 1 week. Pt stats that it is getting larger and more painful. She has been using otc prep h , sits baths, warm flushes with no change. States the pain is uncontrollable she is not able to sit down. Denies any bleeding. States she has started a high fiber diet so has soft stool and has not been straining.    Past Medical History:  Diagnosis Date   Arthritis    Asthma    Carpal tunnel syndrome    Morbid obesity ([place])    Spinal stenosis     Patient Active Problem List   Diagnosis Date Noted   GERD (gastroesophageal reflux disease) 07/28/2019   History of COVID-19 07/02/2019   Anxiety 07/02/2019   Bronchiolitis 07/02/2019   Acute non-recurrent maxillary sinusitis 07/02/2019   Morbid obesity ([place])    Mixed incontinence 03/21/2019   UTERINE FIBROID 04/19/2006    Past Surgical History:  Procedure Laterality Date   CESAREAN SECTION  [phone]   x 3   TUBAL LIGATION      OB History     Gravida  8   Para  4   Term      Preterm      AB  4   Living  4      SAB      IAB      Ectopic      Multiple      Live Births  4            Home Medications    Prior to Admission medications   Medication Sig Start Date End Date Taking? Authorizing Provider  albuterol (PROVENTIL) (2.5 MG/3ML) 0.083% nebulizer solution Take 3 mLs (2.5 mg total) by nebulization every 6 (six) hours as needed for wheezing or shortness of breath. 07/02/19 07/01/20  Elsie Stain, MD  albuterol (VENTOLIN HFA) 108 (90 Base) MCG/ACT inhaler Inhale 1-2 puffs into the lungs every 6 (six) hours as needed for wheezing or shortness of breath. 05/18/20   Kerin Perna, NP  cetirizine-pseudoephedrine (ZYRTEC-D ALLERGY &  CONGESTION) 5-120 MG tablet Take 1 tablet by mouth 2 (two) times daily. 09/14/20   Kerin Perna, NP  fluticasone (FLONASE) 50 MCG/ACT nasal spray Place 2 sprays into both nostrils daily. 09/14/20   Kerin Perna, NP    Family History Family History  Problem Relation Age of Onset   Lung cancer Mother    Diabetes Father    Hypertension Father    Throat cancer Maternal Aunt     Social History Social History   Tobacco Use   Smoking status: Former    Types: Cigarettes    Quit date: 2005    Years since quitting: 17.6   Smokeless tobacco: Never  Vaping Use   Vaping Use: Never used  Substance Use Topics   Alcohol use: Yes    Comment: socially    Drug use: No     Allergies   Latex   Review of Systems Review of Systems  Constitutional: Negative.   Respiratory: Negative.    Cardiovascular: Negative.   Gastrointestinal:  Positive for rectal pain. Negative for anal bleeding, blood in stool, constipation, diarrhea,  nausea and vomiting.  Genitourinary: Negative.   Skin:        Rectal hemorrhoid     Physical Exam Triage Vital Signs ED Triage Vitals  Enc Vitals Group     BP 10/12/20 1004 140/87     Pulse Rate 10/12/20 1004 76     Resp 10/12/20 1004 18     Temp 10/12/20 1004 98.6 F (37 C)     Temp Source 10/12/20 1004 Oral     SpO2 10/12/20 1004 97 %     Weight --      Height --      Head Circumference --      Peak Flow --      Pain Score 10/12/20 1002 10     Pain Loc --      Pain Edu? --      Excl. in [place]? --    No data found.  Updated Vital Signs BP 140/87 (BP Location: Right Arm)   Pulse 76   Temp 98.6 F (37 C) (Oral)   Resp 18   LMP 09/27/2020   SpO2 97%   Visual Acuity Right Eye Distance:   Left Eye Distance:   Bilateral Distance:    Right Eye Near:   Left Eye Near:    Bilateral Near:     Physical Exam Constitutional:      General: She is in acute distress.  Cardiovascular:     Rate and Rhythm: Normal rate.  Pulmonary:      Effort: Pulmonary effort is normal.  Abdominal:     General: Abdomen is flat.  Skin:    Comments: Stage 3 rectal Hemorid pink in color not thrombus in appearance,   Neurological:     Mental Status: She is alert.     UC Treatments / Results  Labs (all labs ordered are listed, but only abnormal results are displayed) Labs Reviewed - No data to display  EKG   Radiology No results found.  Procedures Procedures (including critical care time)  Medications Ordered in UC Medications - No data to display  Initial Impression / Assessment and Plan / UC Course  I have reviewed the triage vital signs and the nursing notes.  Pertinent labs & imaging results that were available during my care of the patient were reviewed by me and considered in my medical decision making (see chart for details).     Expressed to pt that we could try rectal suppositories and she did not think she could tolerate this.  [place] [facility] to get appoint for today a t 330 to evaluate . Educated pt that they may not be able to open and drain but they will see her for treatment.  Take tylenol as needed for pain  Final Clinical Impressions(s) / UC Diagnoses   Final diagnoses:  Grade III hemorrhoids  Rectal pain     Discharge Instructions      Go to [place] [facility] today at 330 and they can evaluate the area. They may or may not be able to open and drain.  Take tylenol as needed for pain  Cont warms sits and prep cream as needed      ED Prescriptions   None    PDMP not reviewed this encounter.   Marney Setting, NP 10/12/20 1047
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
## 2020-10-26 ENCOUNTER — Ambulatory Visit (INDEPENDENT_AMBULATORY_CARE_PROVIDER_SITE_OTHER): Payer: 59 | Admitting: Primary Care

## 2020-11-18 ENCOUNTER — Telehealth (INDEPENDENT_AMBULATORY_CARE_PROVIDER_SITE_OTHER): Payer: Self-pay

## 2020-11-18 NOTE — Telephone Encounter (Signed)
Copied from [place] [phone]. Topic: General - Other >> Nov 17, 2020 12:23 PM Valere Dross wrote: Reason for CRM: [place] stated pt needs a pre authorization sent for [place]: [id number], ext, [phone] . Please advise  Spoke with Kenney Houseman and informed that insurance denied the CT. Also contacted patient and made aware as well. Nat Christen, CMA

## 2020-11-19 ENCOUNTER — Ambulatory Visit (HOSPITAL_COMMUNITY): Payer: 59

## 2020-11-26 ENCOUNTER — Ambulatory Visit (HOSPITAL_COMMUNITY): Payer: 59

## 2020-12-02 ENCOUNTER — Other Ambulatory Visit: Payer: Self-pay | Admitting: Primary Care

## 2020-12-02 DIAGNOSIS — Z1231 Encounter for screening mammogram for malignant neoplasm of breast: Secondary | ICD-10-CM

## 2020-12-09 ENCOUNTER — Ambulatory Visit (INDEPENDENT_AMBULATORY_CARE_PROVIDER_SITE_OTHER): Payer: Self-pay

## 2020-12-09 ENCOUNTER — Ambulatory Visit: Payer: 59 | Attending: Physician Assistant | Admitting: Physician Assistant

## 2020-12-09 ENCOUNTER — Telehealth: Payer: Self-pay | Admitting: Physician Assistant

## 2020-12-09 ENCOUNTER — Other Ambulatory Visit: Payer: Self-pay

## 2020-12-09 VITALS — BP 145/91 | HR 78 | Ht 61.0 in | Wt 199.5 lb

## 2020-12-09 DIAGNOSIS — R202 Paresthesia of skin: Secondary | ICD-10-CM

## 2020-12-09 DIAGNOSIS — F430 Acute stress reaction: Secondary | ICD-10-CM

## 2020-12-09 DIAGNOSIS — Z1331 Encounter for screening for depression: Secondary | ICD-10-CM | POA: Diagnosis not present

## 2020-12-09 MED ORDER — SERTRALINE HCL 50 MG PO TABS: ORAL_TABLET | ORAL | 3 refills | Status: DC
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
## 2020-12-09 NOTE — Telephone Encounter (Signed)
Pt. States yesterday she noticed both feet were cold, reddish in color and felt numb/tingly. Has continued today. No availability until 12/23/20 in the practice per Peter Congo. Pt. Scheduled at [place] today - ok per [place] in the practice.     Answer Assessment - Initial Assessment Questions 1. SYMPTOM: "What is the main symptom you are concerned about?" (e.g., weakness, numbness)     Both feet - numb, tingling 2. ONSET: "When did this start?" (minutes, hours, days; while sleeping)     Yesterday 3. LAST NORMAL: "When was the last time you (the patient) were normal (no symptoms)?"     Yesterday 4. PATTERN "Does this come and go, or has it been constant since it started?"  "Is it present now?"     Constant 5. CARDIAC SYMPTOMS: "Have you had any of the following symptoms: chest pain, difficulty breathing, palpitations?"     No 6. NEUROLOGIC SYMPTOMS: "Have you had any of the following symptoms: headache, dizziness, vision loss, double vision, changes in speech, unsteady on your feet?"     No 7. OTHER SYMPTOMS: "Do you have any other symptoms?"     Tingling, feel cold, discolored 8. PREGNANCY: "Is there any chance you are pregnant?" "When was your last menstrual period?"     No  Protocols used: Neurologic Deficit-A-AH

## 2020-12-10 LAB — CBC WITH DIFFERENTIAL/PLATELET
Basophils Absolute: 0 10*3/uL (ref 0.0–0.2)
Basos: 0 %
EOS (ABSOLUTE): 0.3 10*3/uL (ref 0.0–0.4)
Eos: 4 %
Hematocrit: 33.7 % — ABNORMAL LOW (ref 34.0–46.6)
Hemoglobin: 10.9 g/dL — ABNORMAL LOW (ref 11.1–15.9)
Immature Grans (Abs): 0 10*3/uL (ref 0.0–0.1)
Immature Granulocytes: 0 %
Lymphocytes Absolute: 4.5 10*3/uL — ABNORMAL HIGH (ref 0.7–3.1)
Lymphs: 47 %
MCH: 26.4 pg — ABNORMAL LOW (ref 26.6–33.0)
MCHC: 32.3 g/dL (ref 31.5–35.7)
MCV: 82 fL (ref 79–97)
Monocytes Absolute: 0.9 10*3/uL (ref 0.1–0.9)
Monocytes: 9 %
Neutrophils Absolute: 3.8 10*3/uL (ref 1.4–7.0)
Neutrophils: 40 %
Platelets: 459 10*3/uL — ABNORMAL HIGH (ref 150–450)
RBC: 4.13 x10E6/uL (ref 3.77–5.28)
RDW: 13.5 % (ref 11.7–15.4)
WBC: 9.6 10*3/uL (ref 3.4–10.8)

## 2020-12-10 LAB — TSH: TSH: 2.53 u[IU]/mL (ref 0.450–4.500)

## 2020-12-10 LAB — B12 AND FOLATE PANEL
Folate: 18 ng/mL (ref >3.0)
Vitamin B-12: 1568 pg/mL — ABNORMAL HIGH (ref 232–1245)

## 2020-12-10 LAB — VITAMIN D 25 HYDROXY (VIT D DEFICIENCY, FRACTURES): Vit D, 25-Hydroxy: 41.3 ng/mL (ref 30.0–100.0)

## 2020-12-23 ENCOUNTER — Other Ambulatory Visit: Payer: Self-pay

## 2020-12-23 ENCOUNTER — Encounter (INDEPENDENT_AMBULATORY_CARE_PROVIDER_SITE_OTHER): Payer: Self-pay | Admitting: Primary Care

## 2020-12-23 ENCOUNTER — Ambulatory Visit (INDEPENDENT_AMBULATORY_CARE_PROVIDER_SITE_OTHER): Payer: 59 | Admitting: Primary Care

## 2020-12-23 ENCOUNTER — Other Ambulatory Visit (INDEPENDENT_AMBULATORY_CARE_PROVIDER_SITE_OTHER): Payer: Self-pay | Admitting: Primary Care

## 2020-12-23 VITALS — BP 150/88 | HR 87 | Temp 97.5°F | Ht 61.0 in | Wt 199.4 lb

## 2020-12-23 DIAGNOSIS — Z1331 Encounter for screening for depression: Secondary | ICD-10-CM

## 2020-12-23 DIAGNOSIS — I10 Essential (primary) hypertension: Secondary | ICD-10-CM | POA: Diagnosis not present

## 2020-12-23 DIAGNOSIS — Z23 Encounter for immunization: Secondary | ICD-10-CM

## 2020-12-23 MED ORDER — SERTRALINE HCL 25 MG PO TABS: ORAL_TABLET | ORAL | 0 refills | Status: DC

## 2020-12-23 NOTE — Patient Instructions (Addendum)
Influenza, Adult Influenza is also called "the flu." It is an infection in the lungs, nose, and throat (respiratory tract). It spreads easily from person to person (is contagious). The flu causes symptoms that are like a cold, along with high fever and body aches. What are the causes? This condition is caused by the influenza virus. You can get the virus by: Breathing in droplets that are in the air after a person infected with the flu coughed or sneezed. Touching something that has the virus on it and then touching your mouth, nose, or eyes. What increases the risk? Certain things may make you more likely to get the flu. These include: Not washing your hands often. Having close contact with many people during cold and flu season. Touching your mouth, eyes, or nose without first washing your hands. Not getting a flu shot every year. You may have a higher risk for the flu, and serious problems, such as a lung infection (pneumonia), if you: Are older than 65. Are pregnant. Have a weakened disease-fighting system (immune system) because of a disease or because you are taking certain medicines. Have a long-term (chronic) condition, such as: Heart, kidney, or lung disease. Diabetes. Asthma. Have a liver disorder. Are very overweight (morbidly obese). Have anemia. What are the signs or symptoms? Symptoms usually begin suddenly and last 4-14 days. They may include: Fever and chills. Headaches, body aches, or muscle aches. Sore throat. Cough. Runny or stuffy (congested) nose. Feeling discomfort in your chest. Not wanting to eat as much as normal. Feeling weak or tired. Feeling dizzy. Feeling sick to your stomach or throwing up. How is this treated? If the flu is found early, you can be treated with antiviral medicine. This can help to reduce how bad the illness is and how long it lasts. This may be given by mouth or through an IV tube. Taking care of yourself at home can help your  symptoms get better. Your doctor may want you to: Take over-the-counter medicines. Drink plenty of fluids. The flu often goes away on its own. If you have very bad symptoms or other problems, you may be treated in a hospital. Follow these instructions at home:   Activity Rest as needed. Get plenty of sleep. Stay home from work or school as told by your doctor. Do not leave home until you do not have a fever for 24 hours without taking medicine. Leave home only to go to your doctor. Eating and drinking Take an ORS (oral rehydration solution). This is a drink that is sold at pharmacies and stores. Drink enough fluid to keep your pee pale yellow. Drink clear fluids in small amounts as you are able. Clear fluids include: Water. Ice chips. Fruit juice mixed with water. Low-calorie sports drinks. Eat bland foods that are easy to digest. Eat small amounts as you are able. These foods include: Bananas. Applesauce. Rice. Lean meats. Toast. Crackers. Do not eat or drink: Fluids that have a lot of sugar or caffeine. Alcohol. Spicy or fatty foods. General instructions Take over-the-counter and prescription medicines only as told by your doctor. Use a cool mist humidifier to add moisture to the air in your home. This can make it easier for you to breathe. When using a cool mist humidifier, clean it daily. Empty water and replace with clean water. Cover your mouth and nose when you cough or sneeze. Wash your hands with soap and water often and for at least 20 seconds. This is also important after  you cough or sneeze. If you cannot use soap and water, use alcohol-based hand sanitizer. Keep all follow-up visits. How is this prevented?  Get a flu shot every year. You may get the flu shot in late summer, fall, or winter. Ask your doctor when you should get your flu shot. Avoid contact with people who are sick during fall and winter. This is cold and flu season. Contact a doctor if: You get  new symptoms. You have: Chest pain. Watery poop (diarrhea). A fever. Your cough gets worse. You start to have more mucus. You feel sick to your stomach. You throw up. Get help right away if you: Have shortness of breath. Have trouble breathing. Have skin or nails that turn a bluish color. Have very bad pain or stiffness in your neck. Get a sudden headache. Get sudden pain in your face or ear. Cannot eat or drink without throwing up. These symptoms may represent a serious problem that is an emergency. Get medical help right away. Call your local emergency services (911 in the [place] Do not wait to see if the symptoms will go away. Do not drive yourself to the hospital. Summary Influenza is also called "the flu." It is an infection in the lungs, nose, and throat. It spreads easily from person to person. Take over-the-counter and prescription medicines only as told by your doctor. Getting a flu shot every year is the best way to not get the flu. This information is not intended to replace advice given to you by your health care provider. Make sure you discuss any questions you have with your health care provider. Document Revised: 09/26/2019 Document Reviewed: 09/26/2019 Elsevier Patient Education  2022 [place].  What is anemia? Anemia is the medical term for when a person has too few red blood cells. Red blood cells are the cells in your blood that carry oxygen. If you have too few red blood cells, your body might not get all the oxygen it needs. Different things can cause anemia. The most common cause is not getting enough iron. Another cause of anemia is inflammation. This can happen when the body's infection-fighting system, called the "immune system," is more active due to a medical problem. This is called "anemia of inflammation" or "anemia of chronic disease." Anemia of inflammation is caused by a long-term or "chronic" condition. Examples include diabetes, cancer, or other  problems. The chronic condition makes it harder for the body to use iron to make red blood cells. What are the symptoms of anemia of inflammation? Many people with anemia of inflammation have no symptoms. They find out they have it after their doctor does blood tests for another reason. People who do have symptoms might: ?Feel tired or weak, especially if they try to exercise ?Feel irritable ?Have headaches These symptoms can also be related to the person's health condition. They do not always mean the person has anemia. Should I see a doctor or nurse? Yes. If you have a long-term disease or condition and get the symptoms listed above, tell your doctor or nurse. Is there a test for anemia of inflammation? Your doctor might suspect anemia of inflammation when you have a chronic condition plus anemia. There are several tests that can be done. A blood test can show if you have anemia. Your doctor will also do tests to look for other causes of anemia. You will probably get tests related to your chronic condition, too. If you have anemia and a chronic condition, and tests  do not find another cause for your anemia, your doctor might decide that you likely have anemia of inflammation. There is not 1 specific test that can show if you have anemia of inflammation. Doctors are studying and trying to develop a test for it, but so far there is none. How is anemia of inflammation treated? The main treatment is to treat the chronic condition. For example, if you have diabetes, getting treatment for your diabetes will also help with the anemia. Sometimes other treatments are also given. For example: ?If your body is low in iron, which happens in some people with anemia of inflammation, your doctor will give you iron supplements. You can get extra iron in pills or through a thin tube that goes into a vein, called an "IV." ?In some cases, your doctor might use a medicine that causes your body to make more red blood  cells. These medicines are called erythropoietin or erythropoiesis-stimulating agents, or "ESAs." But this is not common. All topics are updated as new evidence becomes available and our peer review process is complete. This topic retrieved from UpToDate on: Dec 23, 2020. This generalized information is a limited summary of diagnosis, treatment, and/or medication information. It is not meant to be comprehensive and should be used as a tool to help the user understand and/or assess potential diagnostic and treatment options. It does NOT include all information about conditions, treatments, medications, side effects, or risks that may apply to a specific patient. It is not intended to be medical advice or a substitute for the medical advice, diagnosis, or treatment of a health care provider based on the health care provider's examination and assessment of a patient's specific and unique circumstances. Patients must speak with a health care provider for complete information about their health, medical questions, and treatment options, including any risks or benefits regarding use of medications. This information does not endorse any treatments or medications as safe, effective, or approved for treating a specific patient. UpToDate, Inc. and its affiliates disclaim any warranty or liability relating to this information or the use thereof. The use of this information is governed by the Terms of Use, available at https://www.wolterskluwer.com/en/know/clinical-effectiveness-terms 2022 UpToDate, Inc. and its affiliates and/or [employer]. All rights reserved. Topic 17028 Version 7.0

## 2020-12-23 NOTE — Telephone Encounter (Signed)
Please clarify quantity patient is supposed to take daily.

## 2020-12-23 NOTE — Telephone Encounter (Signed)
Copied from [place] [id number]. Topic: General - Other >> Dec 23, 2020  2:52 PM Yvette Rack wrote: Reason for CRM: Pt stated her pharmacy informed her that they are waiting on Juluis Mire to approve the changes to the Rx. Cb# [phone]

## 2020-12-23 NOTE — Telephone Encounter (Signed)
Requested medication (s) are due for refill today:   Prescribed this morning by Juluis Mire  Requested medication (s) are on the active medication list:   Yes  Future visit scheduled:   No   Last ordered: Today 12/23/2020  Returned because pharmacy needs to know how many tablets per day.     Requested Prescriptions  Pending Prescriptions Disp Refills   sertraline (ZOLOFT) 25 MG tablet [Pharmacy Med Name: SERTRALINE 25MG      TAB] 90 tablet 0    Sig: TAKE 1 TABLET BY MOUTH ONCE DAILY     Psychiatry:  Antidepressants - SSRI Passed - 12/23/2020 10:12 AM      Passed - Valid encounter within last 6 months    Recent Outpatient Visits           Today Need for immunization against influenza   [place], [place], NP   2 weeks ago Positive depression screening   [place], [place], [place]   4 months ago Lung nodule   [place] Kerin Perna, NP   7 months ago Spinal stenosis of lumbar region, unspecified whether neurogenic claudication present   [place] Kerin Perna, NP   9 months ago Colon cancer screening   [place] Kerin Perna, NP

## 2020-12-25 IMAGING — CT CT ABD-PELV W/ CM
2 of 5 series · 16 of 46 positions shown, 18 images · IV contrast (iopamidol)
Comparison: Prior CT scan of the abdomen and pelvis 05/14/2017 and
09/01/2005

CLINICAL DATA: 42-year-old female with thoracic back and pelvic
pain since yesterday

EXAM:
CT ABDOMEN AND PELVIS WITH CONTRAST
TECHNIQUE: Multidetector CT imaging of the abdomen and pelvis was performed
using the standard protocol following bolus administration of
intravenous contrast.
CONTRAST:  100mL [id number] IOPAMIDOL ([id number]) INJECTION 61%

[Series 2: axial st · axial · 0.70mm/px · z∈[-420,-50]mm · 13 of 86 slices shown, 15 images]
[im 6/86  soft-tissue]
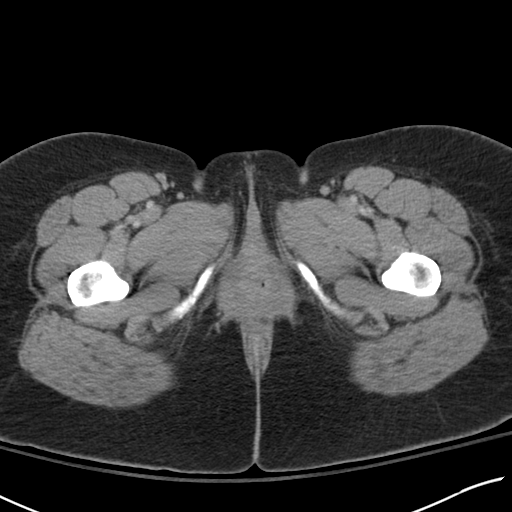
[im 6/86  bone]
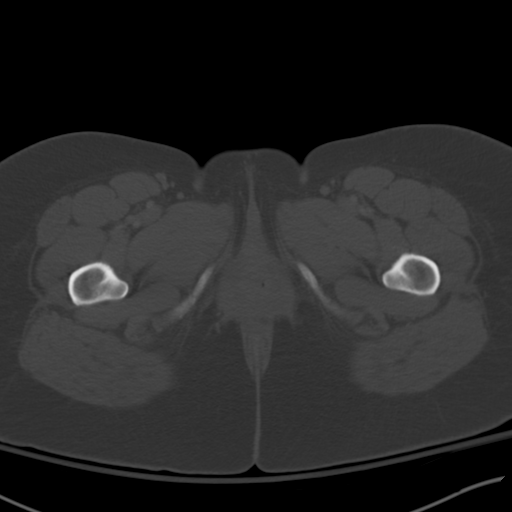
[im 12/86  soft-tissue]
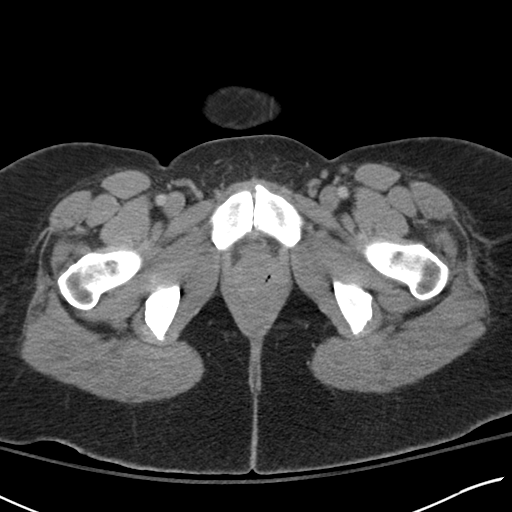
[im 18/86  soft-tissue]
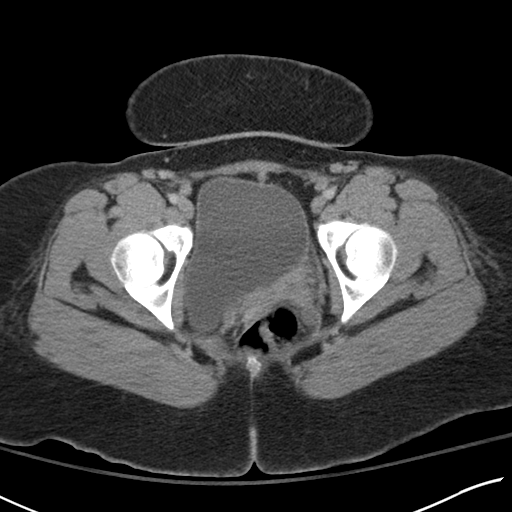
[im 23/86  soft-tissue]
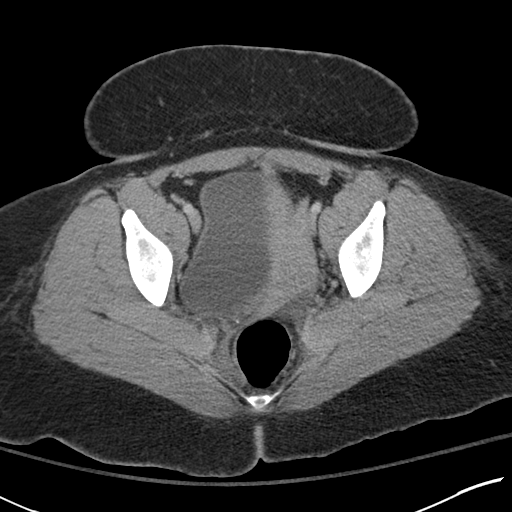
[im 29/86  soft-tissue]
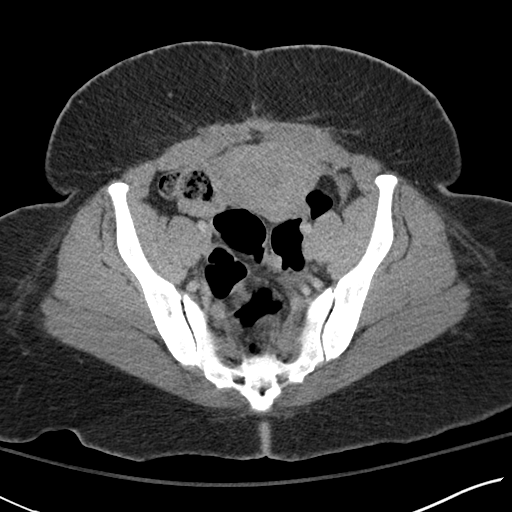
[im 35/86  soft-tissue]
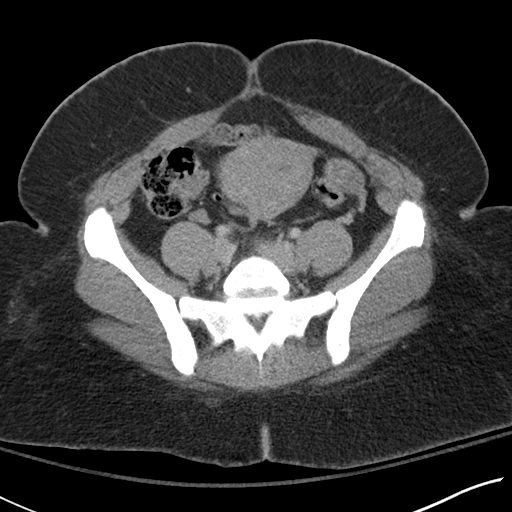
[im 46/86  soft-tissue]
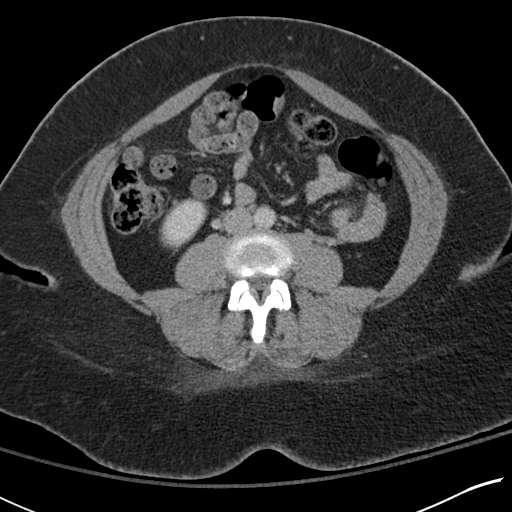
[im 52/86  soft-tissue]
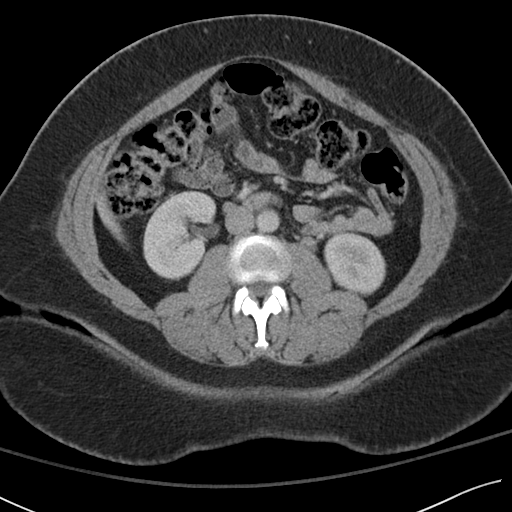
[im 57/86  soft-tissue]
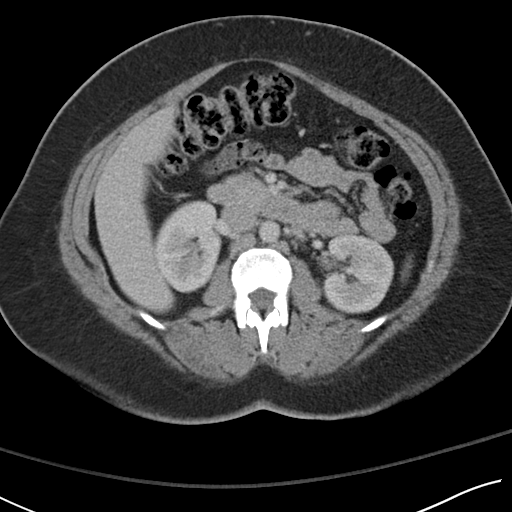
[im 57/86  bone]
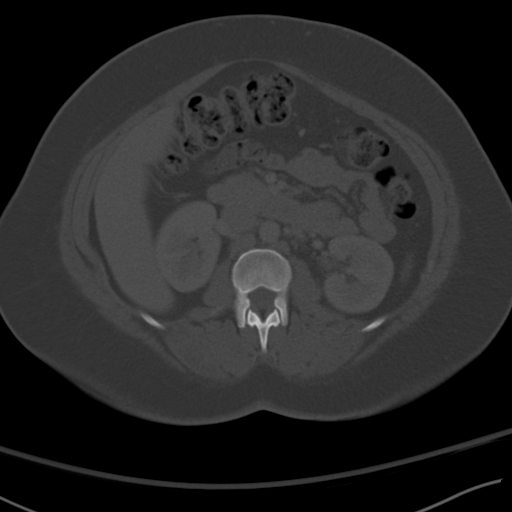
[im 63/86  soft-tissue]
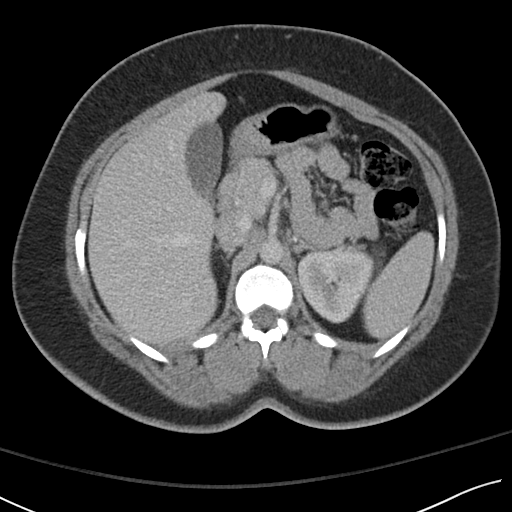
[im 69/86  soft-tissue]
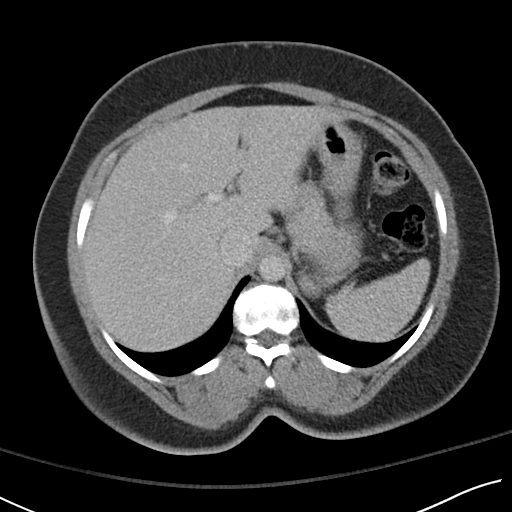
[im 74/86  soft-tissue]
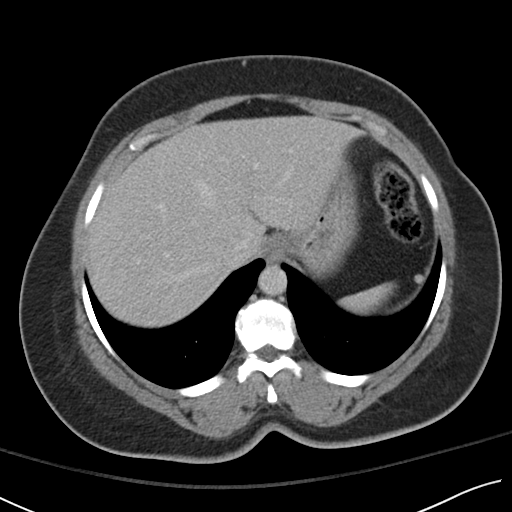
[im 80/86  soft-tissue]
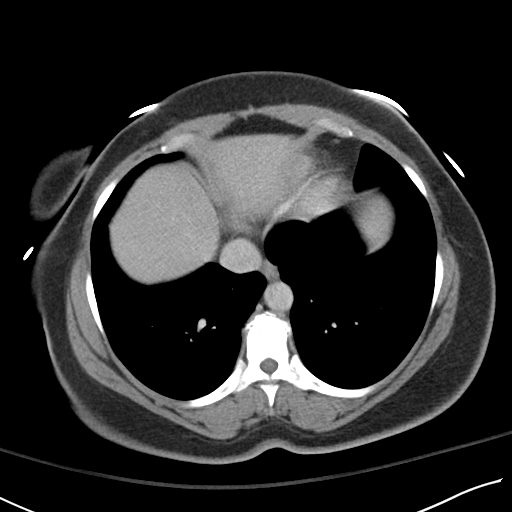

[Series 5: coronal st · coronal · 0.75mm/px · 3 of 107 slices shown]
[im 36/107  soft-tissue]
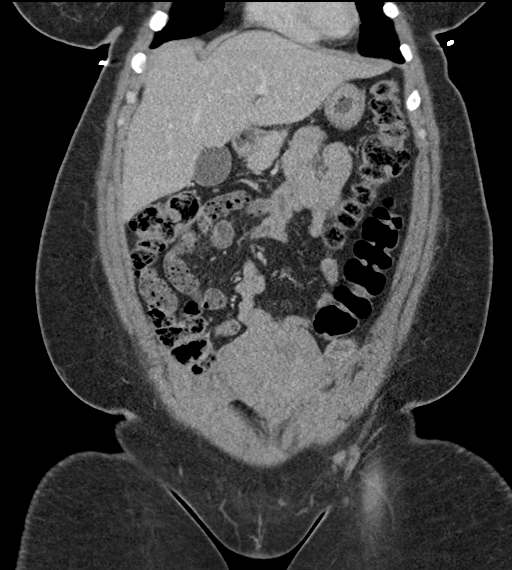
[im 48/107  soft-tissue]
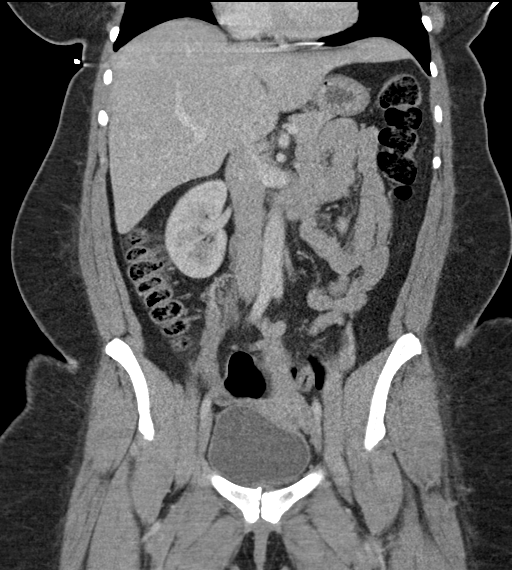
[im 59/107  soft-tissue]
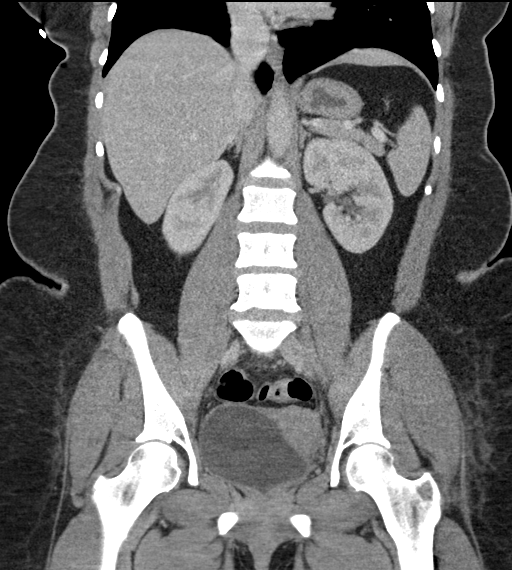

[16 of 46 positions shown; findings below may reference images not displayed]

FINDINGS: Lower chest: 5 mm subpleural pulmonary nodule in the medial aspect
of the right lower lobe. No significant interval change compared to
05/14/2017. The visualized lower lungs are otherwise clear. The
visualized cardiac structures are normal in appearance as is the
distal thoracic esophagus.

Hepatobiliary: Normal hepatic contour and morphology. No discrete
hepatic lesions. Normal appearance of the gallbladder. No intra or
extrahepatic biliary ductal dilatation.

Pancreas: Unremarkable. No pancreatic ductal dilatation or
surrounding inflammatory changes.

Spleen: Normal in size without focal abnormality.

Adrenals/Urinary Tract: Adrenal glands are unremarkable. Kidneys are
normal, without renal calculi, focal lesion, or hydronephrosis.
Bladder is unremarkable.

Stomach/Bowel: No evidence of obstruction or focal bowel wall
thickening. Normal appendix in the right lower quadrant. The
terminal ileum is unremarkable.

Vascular/Lymphatic: No significant vascular findings are present. No
enlarged abdominal or pelvic lymph nodes.

Reproductive: Similar appearance of the uterus which is adherent to
the anterior abdominal wall in the region of a prior Caesarean
section scar. No focal adnexal mass.

Other: No abdominal wall hernia or abnormality. No abdominopelvic
ascites.

Musculoskeletal: No acute fracture or aggressive appearing lytic or
blastic osseous lesion. Mild bilateral L4-L5 facet arthropathy.
IMPRESSION: 1. No acute abnormality within the abdomen or pelvis.
2. Similar appearance of the uterus which appears adherent to the
anterior abdominal wall in the region of a prior Caesarean section
scar.
3. Mild bilateral L4-L5 facet arthropathy.
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
## 2020-12-30 ENCOUNTER — Ambulatory Visit (INDEPENDENT_AMBULATORY_CARE_PROVIDER_SITE_OTHER): Payer: 59 | Admitting: Clinical

## 2020-12-30 ENCOUNTER — Ambulatory Visit
Admission: RE | Admit: 2020-12-30 | Discharge: 2020-12-30 | Disposition: A | Discharge status: Home / Self Care | Payer: 59 | Source: Ambulatory Visit | Attending: Primary Care | Admitting: Primary Care

## 2020-12-30 ENCOUNTER — Other Ambulatory Visit: Payer: Self-pay

## 2020-12-30 DIAGNOSIS — F332 Major depressive disorder, recurrent severe without psychotic features: Secondary | ICD-10-CM | POA: Diagnosis not present

## 2020-12-30 DIAGNOSIS — Z1231 Encounter for screening mammogram for malignant neoplasm of breast: Secondary | ICD-10-CM
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
## 2021-01-19 ENCOUNTER — Other Ambulatory Visit: Payer: Self-pay

## 2021-01-19 ENCOUNTER — Ambulatory Visit: Payer: 59 | Attending: Primary Care | Admitting: Clinical

## 2021-01-19 DIAGNOSIS — F332 Major depressive disorder, recurrent severe without psychotic features: Secondary | ICD-10-CM
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
## 2021-01-26 ENCOUNTER — Ambulatory Visit (INDEPENDENT_AMBULATORY_CARE_PROVIDER_SITE_OTHER): Payer: 59 | Admitting: Pulmonary Disease

## 2021-01-26 ENCOUNTER — Other Ambulatory Visit: Payer: Self-pay

## 2021-01-26 ENCOUNTER — Encounter: Payer: Self-pay | Admitting: Pulmonary Disease

## 2021-01-26 VITALS — BP 120/70 | HR 78 | Temp 98.7°F | Ht 61.0 in | Wt 201.6 lb

## 2021-01-26 DIAGNOSIS — N3000 Acute cystitis without hematuria: Secondary | ICD-10-CM

## 2021-01-26 DIAGNOSIS — Z87891 Personal history of nicotine dependence: Secondary | ICD-10-CM | POA: Diagnosis not present

## 2021-01-26 DIAGNOSIS — R911 Solitary pulmonary nodule: Secondary | ICD-10-CM

## 2021-01-26 MED ORDER — SULFAMETHOXAZOLE-TRIMETHOPRIM 800-160 MG PO TABS: 1.0000 | ORAL_TABLET | Freq: Two times a day (BID) | ORAL | 0 refills | Status: AC

## 2021-01-26 NOTE — Patient Instructions (Addendum)
Thank you for visiting Dr. Valeta Harms at [facility]. Today we recommend the following:\  Meds ordered this encounter  Medications   sulfamethoxazole-trimethoprim (BACTRIM DS) 800-160 MG tablet    Sig: Take 1 tablet by mouth 2 (two) times daily for 7 days.    Dispense:  14 tablet    Refill:  0   Return if symptoms worsen or fail to improve.    Please do your part to reduce the spread of COVID-19.

## 2021-01-26 NOTE — Progress Notes (Signed)
Synopsis: Referred in Dec 2022 for lung nodules by Kerin Perna, NP  Subjective:   PATIENT ID: Michele Trevino GENDER: female DOB: 12/24/1975, MRN: [id number]  Chief Complaint  Patient presents with   Consult    Patient is here to talk about lung nodule.    This is a 45 year old female, past medical history of asthma, obesity, spinal stenosis, carpal tunnel syndrome.  Also has a history of recurrent urinary tract infections.  Her biggest complaint today is actually lower abdominal pain urinary hesitancy and dysuria.  This has been going on for couple of days.  She has been using cranberry juice and over-the-counter medications.  Urinary tract infection E. coli has been resistant to Macrobid and Keflex.  So she was put on Bactrim in the past.  This information was gleaned from her primary care office notes.  She was referred today for evaluation of a lung nodule.  She has a 5 mm right lower lobe lung nodule that is against the pleura.  This was also seen on a 2019 CT scan documented stability since 2019 consistent with a benign etiology.  We reviewed her CT images with her today in the office.   Past Medical History:  Diagnosis Date   Arthritis    Asthma    Carpal tunnel syndrome    Morbid obesity ([place])    Spinal stenosis      Family History  Problem Relation Age of Onset   Lung cancer Mother    Diabetes Father    Hypertension Father    Throat cancer Maternal Aunt      Past Surgical History:  Procedure Laterality Date   CESAREAN SECTION  [phone]   x 3   TUBAL LIGATION      Social History   Socioeconomic History   Marital status: Married    Spouse name: Not on file   Number of children: 4   Years of education: Not on file   Highest education level: Some college, no degree  Occupational History    Comment: NA  Tobacco Use   Smoking status: Former    Types: Cigarettes    Quit date: 2005    Years since quitting: 17.9   Smokeless tobacco: Never   Vaping Use   Vaping Use: Never used  Substance and Sexual Activity   Alcohol use: Yes    Comment: socially    Drug use: No   Sexual activity: Yes    Birth control/protection: Surgical  Other Topics Concern   Not on file  Social History Narrative   Lives with family   Social Determinants of Health   Financial Resource Strain: Not on file  Food Insecurity: Not on file  Transportation Needs: Not on file  Physical Activity: Not on file  Stress: Not on file  Social Connections: Not on file  Intimate Partner Violence: Not on file     Allergies  Allergen Reactions   Latex Swelling     Outpatient Medications Prior to Visit  Medication Sig Dispense Refill   albuterol (VENTOLIN HFA) 108 (90 Base) MCG/ACT inhaler Inhale 1-2 puffs into the lungs every 6 (six) hours as needed for wheezing or shortness of breath. 8 g 1   cetirizine-pseudoephedrine (ZYRTEC-D ALLERGY & CONGESTION) 5-120 MG tablet Take 1 tablet by mouth 2 (two) times daily. 60 tablet 1   fluticasone (FLONASE) 50 MCG/ACT nasal spray Place 2 sprays into both nostrils daily. 16 g 6   sertraline (ZOLOFT) 25 MG tablet TAKE 1  TABLET BY MOUTH AT BEDTIME 90 tablet 0   albuterol (PROVENTIL) (2.5 MG/3ML) 0.083% nebulizer solution Take 3 mLs (2.5 mg total) by nebulization every 6 (six) hours as needed for wheezing or shortness of breath. 75 mL 12   No facility-administered medications prior to visit.    Review of Systems  Constitutional:  Negative for chills, fever, malaise/fatigue and weight loss.  HENT:  Negative for hearing loss, sore throat and tinnitus.   Eyes:  Negative for blurred vision and double vision.  Respiratory:  Negative for cough, hemoptysis, sputum production, shortness of breath, wheezing and stridor.   Cardiovascular:  Negative for chest pain, palpitations, orthopnea, leg swelling and PND.  Gastrointestinal:  Negative for abdominal pain, constipation, diarrhea, heartburn, nausea and vomiting.  Genitourinary:   Positive for dysuria, frequency and urgency. Negative for hematuria.  Musculoskeletal:  Negative for joint pain and myalgias.  Skin:  Negative for itching and rash.  Neurological:  Negative for dizziness, tingling, weakness and headaches.  Endo/Heme/Allergies:  Negative for environmental allergies. Does not bruise/bleed easily.  Psychiatric/Behavioral:  Negative for depression. The patient is not nervous/anxious and does not have insomnia.   All other systems reviewed and are negative.   Objective:  Physical Exam Vitals reviewed.  Constitutional:      General: She is not in acute distress.    Appearance: She is well-developed.  HENT:     Head: Normocephalic and atraumatic.  Eyes:     General: No scleral icterus.    Conjunctiva/sclera: Conjunctivae normal.     Pupils: Pupils are equal, round, and reactive to light.  Neck:     Vascular: No JVD.     Trachea: No tracheal deviation.  Cardiovascular:     Rate and Rhythm: Normal rate and regular rhythm.     Heart sounds: Normal heart sounds. No murmur heard. Pulmonary:     Effort: Pulmonary effort is normal. No tachypnea, accessory muscle usage or respiratory distress.     Breath sounds: No stridor. No wheezing, rhonchi or rales.  Abdominal:     General: There is no distension.     Palpations: Abdomen is soft.     Tenderness: There is no abdominal tenderness.  Musculoskeletal:        General: No tenderness.     Cervical back: Neck supple.  Lymphadenopathy:     Cervical: No cervical adenopathy.  Skin:    General: Skin is warm and dry.     Capillary Refill: Capillary refill takes less than 2 seconds.     Findings: No rash.  Neurological:     Mental Status: She is alert and oriented to person, place, and time.  Psychiatric:        Behavior: Behavior normal.     Vitals:   01/26/21 1502  BP: 120/70  Pulse: 78  Temp: 98.7 F (37.1 C)  TempSrc: Oral  SpO2: 99%  Weight: 201 lb 9.6 oz (91.4 kg)  Height: 5\' 1"  (1.549 m)    99% on RA BMI Readings from Last 3 Encounters:  01/26/21 38.09 kg/m  12/23/20 37.68 kg/m  12/09/20 37.70 kg/m   Wt Readings from Last 3 Encounters:  01/26/21 201 lb 9.6 oz (91.4 kg)  12/23/20 199 lb 6.4 oz (90.4 kg)  12/09/20 199 lb 8 oz (90.5 kg)     CBC    Component Value Date/Time   WBC 9.6 12/09/2020 1633   WBC 8.6 08/13/2019 2100   RBC 4.13 12/09/2020 1633   RBC 4.05 08/13/2019 2100  HGB 10.9 (L) 12/09/2020 1633   HCT 33.7 (L) 12/09/2020 1633   PLT 459 (H) 12/09/2020 1633   MCV 82 12/09/2020 1633   MCH 26.4 (L) 12/09/2020 1633   MCH 23.7 (L) 08/13/2019 2100   MCHC 32.3 12/09/2020 1633   MCHC 30.1 08/13/2019 2100   RDW 13.5 12/09/2020 1633   LYMPHSABS 4.5 (H) 12/09/2020 1633   MONOABS 0.3 08/13/2019 2100   EOSABS 0.3 12/09/2020 1633   BASOSABS 0.0 12/09/2020 1633      Chest Imaging: October 2022 CT abdomen pelvis: Stable 5 mm lung nodule on the right lower lobe periphery medial portion of the pleura.  No change since 2019.  No additional follow-up needed. The patient's images have been independently reviewed by me.    Pulmonary Functions Testing Results: No flowsheet data found.  FeNO:   Pathology:   Echocardiogram:   Heart Catheterization:     Assessment & Plan:     ICD-10-CM   1. Acute cystitis without hematuria  N30.00     2. Lung nodule  R91.1     3. Former smoker  Z87.891       Discussion:  This is a 45 year old female, history of recurrent urinary tract infections presents today with dysuria pain and hesitancy.  Is been getting worse over the past couple days.  She does have a primary care provider follow-up planned for tomorrow.  She is a former smoker however quit 20+ years ago.  The lung nodule that she has is stable in size and has documented stability for greater than 2 years since her 2019 scan.  Plan: As for lung nodule no additional follow-up needed. Encouraged her to continue to stay smoke-free. I will start her with 7  days of Bactrim and encouraged her to follow-up with her primary care provider regarding urinary tract symptoms.  We appreciate consultation.   Current Outpatient Medications:    albuterol (VENTOLIN HFA) 108 (90 Base) MCG/ACT inhaler, Inhale 1-2 puffs into the lungs every 6 (six) hours as needed for wheezing or shortness of breath., Disp: 8 g, Rfl: 1   cetirizine-pseudoephedrine (ZYRTEC-D ALLERGY & CONGESTION) 5-120 MG tablet, Take 1 tablet by mouth 2 (two) times daily., Disp: 60 tablet, Rfl: 1   fluticasone (FLONASE) 50 MCG/ACT nasal spray, Place 2 sprays into both nostrils daily., Disp: 16 g, Rfl: 6   sertraline (ZOLOFT) 25 MG tablet, TAKE 1 TABLET BY MOUTH AT BEDTIME, Disp: 90 tablet, Rfl: 0   sulfamethoxazole-trimethoprim (BACTRIM DS) 800-160 MG tablet, Take 1 tablet by mouth 2 (two) times daily for 7 days., Disp: 14 tablet, Rfl: 0   albuterol (PROVENTIL) (2.5 MG/3ML) 0.083% nebulizer solution, Take 3 mLs (2.5 mg total) by nebulization every 6 (six) hours as needed for wheezing or shortness of breath., Disp: 75 mL, Rfl: 12   Garner Nash, DO [facility] 01/26/2021 3:38 PM

## 2021-01-31 ENCOUNTER — Ambulatory Visit: Payer: 59 | Attending: Primary Care | Admitting: Clinical

## 2021-01-31 ENCOUNTER — Other Ambulatory Visit: Payer: Self-pay

## 2021-01-31 DIAGNOSIS — F331 Major depressive disorder, recurrent, moderate: Secondary | ICD-10-CM | POA: Diagnosis not present
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
## 2021-02-08 ENCOUNTER — Ambulatory Visit (INDEPENDENT_AMBULATORY_CARE_PROVIDER_SITE_OTHER): Payer: Self-pay | Admitting: *Deleted

## 2021-02-08 NOTE — Telephone Encounter (Signed)
Pt needs appt asap for carpal tunnel   Called patient to review symptoms of carpel tunnel issues. No answer, left message on voicemail to call clinic back [phone].

## 2021-02-08 NOTE — Telephone Encounter (Signed)
Second attempt to call patient- left message to call office. 

## 2021-02-08 NOTE — Telephone Encounter (Signed)
Patient called, left VM to return the call to the office to discuss symptoms with a nurse. Unable to reach patient after 3 attempts by [facility] NT, routing to the provider for resolution per protocol.   Summary: pt needs an appt for carpal tunnel asap   Pt needs appt asap for carpal tunnel   ----- Message from Pendleton Callas sent at 02/08/2021 12:26 PM EST -----  Pt called saying her Carpal Tunnel both wrist is hurting and wants to see Juluis Mire.  The first appt is in Jan.  She says she needs to be seen sooner than that.   Cb #  [phone]

## 2021-02-08 NOTE — Telephone Encounter (Signed)
Pt has appt on 12/27.

## 2021-02-15 ENCOUNTER — Encounter (INDEPENDENT_AMBULATORY_CARE_PROVIDER_SITE_OTHER): Payer: Self-pay | Admitting: Primary Care

## 2021-02-15 ENCOUNTER — Ambulatory Visit (INDEPENDENT_AMBULATORY_CARE_PROVIDER_SITE_OTHER): Payer: 59 | Admitting: Primary Care

## 2021-02-15 ENCOUNTER — Other Ambulatory Visit: Payer: Self-pay

## 2021-02-15 VITALS — BP 145/92 | HR 64 | Temp 97.7°F | Ht 61.0 in | Wt 202.6 lb

## 2021-02-15 DIAGNOSIS — F332 Major depressive disorder, recurrent severe without psychotic features: Secondary | ICD-10-CM

## 2021-02-15 DIAGNOSIS — Z1331 Encounter for screening for depression: Secondary | ICD-10-CM

## 2021-02-15 DIAGNOSIS — R030 Elevated blood-pressure reading, without diagnosis of hypertension: Secondary | ICD-10-CM

## 2021-02-15 DIAGNOSIS — G5603 Carpal tunnel syndrome, bilateral upper limbs: Secondary | ICD-10-CM | POA: Diagnosis not present

## 2021-02-15 MED ORDER — SERTRALINE HCL 25 MG PO TABS: ORAL_TABLET | ORAL | 1 refills | Status: DC

## 2021-02-15 NOTE — Progress Notes (Signed)
Michele Trevino, is a 45 y.o. female  UMP:[id number]  QMG:[id number]  DOB - 05/27/1975  Chief Complaint  Patient presents with   Carpal Tunnel       Subjective:   Michele Trevino is a 45 y.o. female here today for a follow up visit on anxiety. She feels better and contributes it to her new medication- Zoloft denies any problems or side effects. She does have for concern about carpal tunnel. Previously was treated with Cortizone injections.   Patient has No headache, No chest pain, No abdominal pain - No Nausea, No new weakness tingling or numbness, No Cough - SOB.  No problems updated.  ALLERGIES: Allergies  Allergen Reactions   Latex Swelling    PAST MEDICAL HISTORY: Past Medical History:  Diagnosis Date   Arthritis    Asthma    Carpal tunnel syndrome    Morbid obesity ([place])    Spinal stenosis     MEDICATIONS AT HOME: Prior to Admission medications   Medication Sig Start Date End Date Taking? Authorizing Provider  albuterol (VENTOLIN HFA) 108 (90 Base) MCG/ACT inhaler Inhale 1-2 puffs into the lungs every 6 (six) hours as needed for wheezing or shortness of breath. 05/18/20  Yes Kerin Perna, NP  cetirizine-pseudoephedrine (ZYRTEC-D ALLERGY & CONGESTION) 5-120 MG tablet Take 1 tablet by mouth 2 (two) times daily. 09/14/20  Yes Kerin Perna, NP  fluticasone (FLONASE) 50 MCG/ACT nasal spray Place 2 sprays into both nostrils daily. 09/14/20  Yes Kerin Perna, NP  sertraline (ZOLOFT) 25 MG tablet TAKE 1 TABLET BY MOUTH AT BEDTIME 12/25/20  Yes Kerin Perna, NP  albuterol (PROVENTIL) (2.5 MG/3ML) 0.083% nebulizer solution Take 3 mLs (2.5 mg total) by nebulization every 6 (six) hours as needed for wheezing or shortness of breath. 07/02/19 07/01/20  Elsie Stain, MD    Objective:   Vitals:   02/15/21 1104 02/15/21 1120  BP: (!) 152/93 (!) 145/92  Pulse: 63 64  Temp: 97.7 F (36.5 C)   TempSrc: Temporal   SpO2: 100%   Weight: 202  lb 9.6 oz (91.9 kg)   Height: 5\' 1"  (1.549 m)    Exam General appearance : Awake, alert, not in any distress. Speech Clear. Not toxic looking HEENT: Atraumatic and Normocephalic, pupils equally reactive to light and accomodation Neck: Supple, no JVD. No cervical lymphadenopathy.  Chest: Good air entry bilaterally, no added sounds  CVS: S1 S2 regular, no murmurs.  Abdomen: Bowel sounds present, Non tender and not distended with no gaurding, rigidity or rebound. Extremities: B/L Lower Ext shows no edema, both legs are warm to touch Neurology: Awake alert, and oriented X 3, CN II-XII intact, Non focal Skin: No Rash  Data Review Lab Results  Component Value Date   HGBA1C 5.5 05/18/2020   HGBA1C 5.9 (A) 08/14/2018   HGBA1C 5.7 (H) 12/28/2017    Assessment & Plan  Mikelle was seen today for carpal tunnel.  Diagnoses and all orders for this visit:  Bilateral carpal tunnel syndrome -     Ambulatory referral to Orthopedic Surgery  Severe episode of recurrent major depressive disorder, without psychotic features ([place]) She is feeling a lot better feels Zoloft 10mg  is the correct dose and has been helping.   Elevated BP without diagnosis of hypertension Counseled on blood pressure goal of less than 130/80, low-sodium, DASH diet, medication compliance, 150 minutes of moderate intensity exercise per week. Discussed on f/u if lifestyle modifications did not reduce her Bp then we  will have to discuss medication- also discussed risk factors   Positive depression screening -     sertraline (ZOLOFT) 25 MG tablet; TAKE 1 TABLET BY MOUTH AT BEDTIME    Patient have been counseled extensively about nutrition and exercise. Other issues discussed during this visit include: low cholesterol diet, weight control and daily exercise, foot care, annual eye examinations at Ophthalmology, importance of adherence with medications and regular follow-up. We also discussed long term complications of uncontrolled  diabetes and hypertension.   Return in about 2 months (around 04/18/2021) for Bp f/u.  The patient was given clear instructions to go to ER or return to medical center if symptoms don't improve, worsen or new problems develop. The patient verbalized understanding. The patient was told to call to get lab results if they haven't heard anything in the next week.   This note has been created with Surveyor, quantity. Any transcriptional errors are unintentional.    Kerin Perna, 02/15/2021, 4:46 PM

## 2021-02-15 NOTE — Patient Instructions (Signed)

## 2021-02-23 ENCOUNTER — Ambulatory Visit: Payer: 59 | Admitting: Clinical

## 2021-03-12 ENCOUNTER — Ambulatory Visit (HOSPITAL_COMMUNITY)
Admission: EM | Admit: 2021-03-12 | Discharge: 2021-03-12 | Disposition: A | Discharge status: Home / Self Care | Payer: Commercial Managed Care - HMO | Attending: Emergency Medicine | Admitting: Emergency Medicine

## 2021-03-12 ENCOUNTER — Ambulatory Visit (INDEPENDENT_AMBULATORY_CARE_PROVIDER_SITE_OTHER): Payer: Commercial Managed Care - HMO

## 2021-03-12 ENCOUNTER — Encounter (HOSPITAL_COMMUNITY): Payer: Self-pay

## 2021-03-12 ENCOUNTER — Other Ambulatory Visit: Payer: Self-pay

## 2021-03-12 DIAGNOSIS — M79672 Pain in left foot: Secondary | ICD-10-CM | POA: Diagnosis not present

## 2021-03-12 LAB — CBC WITH DIFFERENTIAL/PLATELET
Abs Immature Granulocytes: 0.03 10*3/uL (ref 0.00–0.07)
Basophils Absolute: 0 10*3/uL (ref 0.0–0.1)
Basophils Relative: 0 %
Eosinophils Absolute: 0.3 10*3/uL (ref 0.0–0.5)
Eosinophils Relative: 3 %
HCT: 33.6 % — ABNORMAL LOW (ref 36.0–46.0)
Hemoglobin: 10.8 g/dL — ABNORMAL LOW (ref 12.0–15.0)
Immature Granulocytes: 0 %
Lymphocytes Relative: 42 %
Lymphs Abs: 4.1 10*3/uL — ABNORMAL HIGH (ref 0.7–4.0)
MCH: 27 pg (ref 26.0–34.0)
MCHC: 32.1 g/dL (ref 30.0–36.0)
MCV: 84 fL (ref 80.0–100.0)
Monocytes Absolute: 0.6 10*3/uL (ref 0.1–1.0)
Monocytes Relative: 6 %
Neutro Abs: 4.6 10*3/uL (ref 1.7–7.7)
Neutrophils Relative %: 49 %
Platelets: 461 10*3/uL — ABNORMAL HIGH (ref 150–400)
RBC: 4 MIL/uL (ref 3.87–5.11)
RDW: 15.3 % (ref 11.5–15.5)
WBC: 9.7 10*3/uL (ref 4.0–10.5)
nRBC: 0 % (ref 0.0–0.2)

## 2021-03-12 LAB — URIC ACID: Uric Acid, Serum: 3.7 mg/dL (ref 2.5–7.1)

## 2021-03-12 LAB — SEDIMENTATION RATE: Sed Rate: 13 mm/hr (ref 0–22)

## 2021-03-12 MED ORDER — KETOROLAC TROMETHAMINE 60 MG/2ML IM SOLN
60.0000 mg | Freq: Once | INTRAMUSCULAR | Status: AC
  Administered 2021-03-12: 60 mg via INTRAMUSCULAR

## 2021-03-12 MED ORDER — KETOROLAC TROMETHAMINE 60 MG/2ML IM SOLN
INTRAMUSCULAR | Status: AC
  Filled 2021-03-12: qty 2

## 2021-03-12 MED ORDER — INDOMETHACIN 50 MG PO CAPS: 50.0000 mg | ORAL_CAPSULE | Freq: Three times a day (TID) | ORAL | 1 refills | Status: DC | PRN

## 2021-03-12 NOTE — Discharge Instructions (Addendum)
Your x-ray was concerning for a small bone spur, a bony bunion and possible gout.  For this reason, we are performing blood tests to look for signs of inflammation, possible infection and elevated uric acid level (which causes gout).  These results take 24 to 48 hours to completed, initially they are posted to your MyChart and if they are abnormal, you will receive a phone call with further instructions.  In the meantime please begin indomethacin, a potent anti-inflammatory medication that should significantly address your pain and swelling.  You can take this medication 3 times daily as needed for pain and swelling.  Once your pain and swelling are resolved, you can discontinue.  It is okay to skip doses, just take it as needed.  Thank you for visiting urgent care today.  I appreciate the opportunity to participate in your care.

## 2021-03-12 NOTE — ED Provider Notes (Signed)
[facility]    CSN: [id number] Arrival date & time: 03/12/21  1518    HISTORY   Chief Complaint  Patient presents with   Foot Pain   HPI Michele Trevino is a 46 y.o. female. Patient presents with pain and significant swelling on the inside of her left foot for the past 3 days, patient denies recent trauma to foot.  Patient reports a history of carpal tunnel syndrome and arthritis, states she has an upcoming appointment with primary care for evaluation of these issues.  Patient states she has never had swelling in either foot like this before.  Patient has normal vital signs on arrival today.  Patient is sitting in wheelchair to enter the room due to not be able to walk secondary to pain.  Patient states she is unaware of any prior diagnosis autoimmune disease, history of gout or previous trauma to her left foot.  Patient states initially she could walk on her left foot with some discomfort but was able to tolerate this by walking on the outside of her left foot, states that she is now unable to do so.  EMR reviewed, patient does have a history of prediabetes, A1c last checked 2 years ago, she is also had some intermittent elevation of serum creatinine, most recently it was normal.  Patient also has a recent history of microcytic anemia.  Patient is morbidly obese.  The history is provided by the patient.  Past Medical History:  Diagnosis Date   Arthritis    Asthma    Carpal tunnel syndrome    Morbid obesity ([place])    Spinal stenosis    Patient Active Problem List   Diagnosis Date Noted   GERD (gastroesophageal reflux disease) 07/28/2019   History of COVID-19 07/02/2019   Anxiety 07/02/2019   Bronchiolitis 07/02/2019   Acute non-recurrent maxillary sinusitis 07/02/2019   Morbid obesity ([place])    Mixed incontinence 03/21/2019   UTERINE FIBROID 04/19/2006   Past Surgical History:  Procedure Laterality Date   CESAREAN SECTION  [phone]   x 3   TUBAL  LIGATION     OB History     Gravida  8   Para  4   Term      Preterm      AB  4   Living  4      SAB      IAB      Ectopic      Multiple      Live Births  4          Home Medications    Prior to Admission medications   Medication Sig Start Date End Date Taking? Authorizing Provider  albuterol (PROVENTIL) (2.5 MG/3ML) 0.083% nebulizer solution Take 3 mLs (2.5 mg total) by nebulization every 6 (six) hours as needed for wheezing or shortness of breath. 07/02/19 07/01/20  Elsie Stain, MD  albuterol (VENTOLIN HFA) 108 (90 Base) MCG/ACT inhaler Inhale 1-2 puffs into the lungs every 6 (six) hours as needed for wheezing or shortness of breath. 05/18/20   Kerin Perna, NP  cetirizine-pseudoephedrine (ZYRTEC-D ALLERGY & CONGESTION) 5-120 MG tablet Take 1 tablet by mouth 2 (two) times daily. 09/14/20   Kerin Perna, NP  fluticasone (FLONASE) 50 MCG/ACT nasal spray Place 2 sprays into both nostrils daily. 09/14/20   Kerin Perna, NP  sertraline (ZOLOFT) 25 MG tablet TAKE 1 TABLET BY MOUTH AT BEDTIME 02/15/21   Kerin Perna, NP  Family History Family History  Problem Relation Age of Onset   Lung cancer Mother    Diabetes Father    Hypertension Father    Throat cancer Maternal Aunt    Social History Social History   Tobacco Use   Smoking status: Former    Types: Cigarettes    Quit date: 2005    Years since quitting: 18.0   Smokeless tobacco: Never  Vaping Use   Vaping Use: Never used  Substance Use Topics   Alcohol use: Yes    Comment: socially    Drug use: No   Allergies   Latex  Review of Systems Review of Systems Pertinent findings noted in history of present illness.   Physical Exam Triage Vital Signs ED Triage Vitals  Enc Vitals Group     BP 12/17/20 0827 (!) 147/82     Pulse Rate 12/17/20 0827 72     Resp 12/17/20 0827 18     Temp 12/17/20 0827 98.3 F (36.8 C)     Temp Source 12/17/20 0827 Oral     SpO2  12/17/20 0827 98 %     Weight --      Height --      Head Circumference --      Peak Flow --      Pain Score 12/17/20 0826 5     Pain Loc --      Pain Edu? --      Excl. in [place]? --   No data found.  Updated Vital Signs BP 128/81 (BP Location: Right Arm)    Pulse 71    Temp 98.4 F (36.9 C) (Oral)    Resp 18    SpO2 97%   Physical Exam Vitals and nursing note reviewed.  Constitutional:      General: She is not in acute distress.    Appearance: Normal appearance. She is not ill-appearing.  HENT:     Head: Normocephalic and atraumatic.  Eyes:     General: Lids are normal.        Right eye: No discharge.        Left eye: No discharge.     Extraocular Movements: Extraocular movements intact.     Conjunctiva/sclera: Conjunctivae normal.     Right eye: Right conjunctiva is not injected.     Left eye: Left conjunctiva is not injected.  Neck:     Trachea: Trachea and phonation normal.  Cardiovascular:     Rate and Rhythm: Normal rate and regular rhythm.     Pulses: Normal pulses.     Heart sounds: Normal heart sounds. No murmur heard.   No friction rub. No gallop.  Pulmonary:     Effort: Pulmonary effort is normal. No accessory muscle usage, prolonged expiration or respiratory distress.     Breath sounds: Normal breath sounds. No stridor, decreased air movement or transmitted upper airway sounds. No decreased breath sounds, wheezing, rhonchi or rales.  Chest:     Chest wall: No tenderness.  Musculoskeletal:        General: Swelling and tenderness present.     Cervical back: Normal range of motion and neck supple. Normal range of motion.  Lymphadenopathy:     Cervical: No cervical adenopathy.  Skin:    General: Skin is warm and dry.     Findings: No erythema or rash.  Neurological:     General: No focal deficit present.     Mental Status: She is alert and oriented to person, place,  and time.  Psychiatric:        Mood and Affect: Mood normal.        Behavior: Behavior  normal.    Visual Acuity Right Eye Distance:   Left Eye Distance:   Bilateral Distance:    Right Eye Near:   Left Eye Near:    Bilateral Near:     [facility] Couse / Diagnostics / Procedures:    EKG  Radiology DG Foot Complete Left  Result Date: 03/12/2021 CLINICAL DATA:  Acute onset left foot swelling EXAM: LEFT FOOT - COMPLETE 3+ VIEW COMPARISON:  None. FINDINGS: Bony bunion noted along with a well corticated 0.4 by 0.2 cm bony ossicle along the medial margin of the bony bunion. Small lucencies along the bony bunion, difficult to totally exclude the possibility of early erosion or gout. No malalignment at the Lisfranc joint. No acute fracture identified. Plantar calcaneal spur. Spurring of the first metatarsal head. IMPRESSION: 1. Bony bunion along the first metatarsal head with a small well corticated adjacent medial ossicle. 2. Small lucencies in the bony bunion, difficult to exclude small erosions/gout. Electronically Signed   By: Van Clines M.D.   On: 03/12/2021 17:18    Procedures Procedures (including critical care time)  UC Diagnoses / Final Clinical Impressions(s)   I have reviewed the triage vital signs and the nursing notes.  Pertinent labs & imaging results that were available during my care of the patient were reviewed by me and considered in my medical decision making (see chart for details).    Final diagnoses:  Foot pain, left   X-ray concerning for possible gout, labs obtained, begin indomethacin, patient to follow-up with primary care.  ED Prescriptions     Medication Sig Dispense Auth. Provider   indomethacin (INDOCIN) 50 MG capsule Take 1 capsule (50 mg total) by mouth 3 (three) times daily as needed for moderate pain. 42 capsule Lynden Oxford Scales, PA-C      PDMP not reviewed this encounter.  Pending results:  Labs Reviewed  CBC WITH DIFFERENTIAL/PLATELET  URIC ACID  SEDIMENTATION RATE    Medications Ordered in UC: Medications  ketorolac  (TORADOL) injection 60 mg (60 mg Intramuscular Given 03/12/21 1709)    Disposition Upon Discharge:  Condition: stable for discharge home Home: take medications as prescribed; routine discharge instructions as discussed; follow up as advised.  Patient presented with an acute illness with associated systemic symptoms and significant discomfort requiring urgent management. In my opinion, this is a condition that a prudent lay person (someone who possesses an average knowledge of health and medicine) may potentially expect to result in complications if not addressed urgently such as respiratory distress, impairment of bodily function or dysfunction of bodily organs.   Routine symptom specific, illness specific and/or disease specific instructions were discussed with the patient and/or caregiver at length.   As such, the patient has been evaluated and assessed, work-up was performed and treatment was provided in alignment with urgent care protocols and evidence based medicine.  Patient/parent/caregiver has been advised that the patient may require follow up for further testing and treatment if the symptoms continue in spite of treatment, as clinically indicated and appropriate.  If the patient was tested for COVID-19, Influenza and/or RSV, then the patient/parent/guardian was advised to isolate at home pending the results of his/her diagnostic coronavirus test and potentially longer if theyre positive. I have also advised pt that if his/her COVID-19 test returns positive, it's recommended to self-isolate for at least 10 days  after symptoms first appeared AND until fever-free for 24 hours without fever reducer AND other symptoms have improved or resolved. Discussed self-isolation recommendations as well as instructions for household member/close contacts as per the [facility] and [place] DHHS, and also gave patient the [place] packet with this information.  Patient/parent/caregiver has been advised to return to the [facility]  or PCP in 3-5 days if no better; to PCP or the Emergency Department if new signs and symptoms develop, or if the current signs or symptoms continue to change or worsen for further workup, evaluation and treatment as clinically indicated and appropriate  The patient will follow up with their current PCP if and as advised. If the patient does not currently have a PCP we will assist them in obtaining one.   The patient may need specialty follow up if the symptoms continue, in spite of conservative treatment and management, for further workup, evaluation, consultation and treatment as clinically indicated and appropriate.   Patient/parent/caregiver verbalized understanding and agreement of plan as discussed.  All questions were addressed during visit.  Please see discharge instructions below for further details of plan.  Discharge Instructions:   Discharge Instructions      Your x-ray was concerning for a small bone spur, a bony bunion and possible gout.  For this reason, we are performing blood tests to look for signs of inflammation, possible infection and elevated uric acid level (which causes gout).  These results take 24 to 48 hours to completed, initially they are posted to your MyChart and if they are abnormal, you will receive a phone call with further instructions.  In the meantime please begin indomethacin, a potent anti-inflammatory medication that should significantly address your pain and swelling.  You can take this medication 3 times daily as needed for pain and swelling.  Once your pain and swelling are resolved, you can discontinue.  It is okay to skip doses, just take it as needed.  Thank you for visiting urgent care today.  I appreciate the opportunity to participate in your care.      This office note has been dictated using Museum/gallery curator.  Unfortunately, and despite my best efforts, this method of dictation can sometimes lead to occasional  typographical or grammatical errors.  I apologize in advance if this occurs.     Lynden Oxford Scales, PA-C 03/12/21 1745

## 2021-03-12 NOTE — ED Triage Notes (Signed)
Pt presents with left foot pain with no injury involved X 3 days.

## 2021-03-14 ENCOUNTER — Ambulatory Visit (INDEPENDENT_AMBULATORY_CARE_PROVIDER_SITE_OTHER): Payer: Self-pay

## 2021-03-14 NOTE — Telephone Encounter (Signed)
°  Chief Complaint: L foot pain Symptoms: L foot pain, unable to bear weight, swelling Frequency: 5 days Pertinent Negatives: NA Disposition: [] ED /[] Urgent Care (no appt availability in office) / [] Appointment(In office/virtual)/ []  [facility]/ [] Home Care/ [] Refused Recommended Disposition /[x] [facility]/ []  Follow-up with PCP Additional Notes: Pt went to UC on 03/12/21, had xray and labs done. Was prescribed meds for gout but she states meds aren't helping and labs came back normal. Attempted to schedule with office but nothing available until 03/28/21. Pt given location details for [facility].    Reason for Disposition  [1] MODERATE pain (e.g., interferes with normal activities, limping) AND [2] present > 3 days  Answer Assessment - Initial Assessment Questions 1. ONSET: "When did the pain start?"      Thursday 2. LOCATION: "Where is the pain located?"      L foot, near ankle and heel, medial side 3. PAIN: "How bad is the pain?"    (Scale 1-10; or mild, moderate, severe)  - MILD (1-3): doesn't interfere with normal activities.   - MODERATE (4-7): interferes with normal activities (e.g., work or school) or awakens from sleep, limping.   - SEVERE (8-10): excruciating pain, unable to do any normal activities, unable to walk.      6 6. OTHER SYMPTOMS: "Do you have any other symptoms?" (e.g., leg pain, rash, fever, numbness)     Swelling, unable to put full weight on foot  Protocols used: Foot Pain-A-AH

## 2021-03-14 NOTE — Telephone Encounter (Signed)
Summary: Gout   Pt called in stating she may have a gout break out on her foot, and is unable to walk, please advise.     Called pt and LM on VM to call back to discuss sx.

## 2021-03-14 NOTE — Telephone Encounter (Signed)
Called pt and LM on VM to call back to discuss sx.

## 2021-03-15 ENCOUNTER — Ambulatory Visit: Payer: Managed Care, Other (non HMO) | Admitting: Physician Assistant

## 2021-03-15 ENCOUNTER — Other Ambulatory Visit: Payer: Self-pay

## 2021-03-15 VITALS — BP 144/81 | HR 84 | Temp 98.2°F | Resp 18 | Ht 61.0 in | Wt 206.0 lb

## 2021-03-15 DIAGNOSIS — M25472 Effusion, left ankle: Secondary | ICD-10-CM | POA: Diagnosis not present

## 2021-03-15 DIAGNOSIS — M79672 Pain in left foot: Secondary | ICD-10-CM | POA: Diagnosis not present

## 2021-03-15 NOTE — Progress Notes (Signed)
Continue  Established Patient Office Visit  Subjective:  Patient ID: Michele Trevino, female    DOB: [birth year]  Age: 46 y.o. MRN: [id number]  CC:  Chief Complaint  Patient presents with   Follow-up    Gout L foot    HPI LUCEAL HOLLIBAUGH states that she was seen at Urgent Care on 03/12/21.  States that she had imaging completed  Radiology DG Foot Complete Left   Result Date: 03/12/2021 CLINICAL DATA:  Acute onset left foot swelling EXAM: LEFT FOOT - COMPLETE 3+ VIEW COMPARISON:  None. FINDINGS: Bony bunion noted along with a well corticated 0.4 by 0.2 cm bony ossicle along the medial margin of the bony bunion. Small lucencies along the bony bunion, difficult to totally exclude the possibility of early erosion or gout. No malalignment at the Lisfranc joint. No acute fracture identified. Plantar calcaneal spur. Spurring of the first metatarsal head. IMPRESSION: 1. Bony bunion along the first metatarsal head with a small well corticated adjacent medial ossicle. 2. Small lucencies in the bony bunion, difficult to exclude small erosions/gout. Electronically Signed   By: Van Clines M.D.   On: 03/12/2021 17:18    And that she was started on indomethacin  and given a dose of toradol.  States today that her foot pain started on the 18th, denies injury or trauma. States that she did not start the indomethacin after calling the UC and learning that her uric acid levels were not elevated.  States that her pain is better in the morning, swelling is reduced.  States that as the day goes on her swelling does increase, pain makes it difficult for her to step on her foot.  Has not tried anything for relief.   Past Medical History:  Diagnosis Date   Arthritis    Asthma    Carpal tunnel syndrome    Morbid obesity ([place])    Spinal stenosis     Past Surgical History:  Procedure Laterality Date   CESAREAN SECTION  [phone]   x 3   TUBAL LIGATION      Family History   Problem Relation Age of Onset   Lung cancer Mother    Diabetes Father    Hypertension Father    Throat cancer Maternal Aunt     Social History   Socioeconomic History   Marital status: Married    Spouse name: Not on file   Number of children: 4   Years of education: Not on file   Highest education level: Some college, no degree  Occupational History    Comment: NA  Tobacco Use   Smoking status: Former    Types: Cigarettes    Quit date: 2005    Years since quitting: 18.0   Smokeless tobacco: Never  Vaping Use   Vaping Use: Never used  Substance and Sexual Activity   Alcohol use: Yes    Comment: socially    Drug use: No   Sexual activity: Yes    Birth control/protection: Surgical  Other Topics Concern   Not on file  Social History Narrative   Lives with family   Social Determinants of Health   Financial Resource Strain: Not on file  Food Insecurity: Not on file  Transportation Needs: Not on file  Physical Activity: Not on file  Stress: Not on file  Social Connections: Not on file  Intimate Partner Violence: Not on file    Outpatient Medications Prior to Visit  Medication Sig Dispense Refill   albuterol (VENTOLIN  HFA) 108 (90 Base) MCG/ACT inhaler Inhale 1-2 puffs into the lungs every 6 (six) hours as needed for wheezing or shortness of breath. 8 g 1   cetirizine-pseudoephedrine (ZYRTEC-D ALLERGY & CONGESTION) 5-120 MG tablet Take 1 tablet by mouth 2 (two) times daily. 60 tablet 1   fluticasone (FLONASE) 50 MCG/ACT nasal spray Place 2 sprays into both nostrils daily. 16 g 6   sertraline (ZOLOFT) 25 MG tablet TAKE 1 TABLET BY MOUTH AT BEDTIME 90 tablet 1   albuterol (PROVENTIL) (2.5 MG/3ML) 0.083% nebulizer solution Take 3 mLs (2.5 mg total) by nebulization every 6 (six) hours as needed for wheezing or shortness of breath. 75 mL 12   indomethacin (INDOCIN) 50 MG capsule Take 1 capsule (50 mg total) by mouth 3 (three) times daily as needed for moderate pain.  (Patient not taking: Reported on 03/15/2021) 42 capsule 1   No facility-administered medications prior to visit.    Allergies  Allergen Reactions   Latex Swelling    ROS Review of Systems  Constitutional:  Negative for chills and fever.  HENT: Negative.    Eyes: Negative.   Respiratory: Negative.    Cardiovascular:  Negative for chest pain.  Gastrointestinal: Negative.   Endocrine: Negative.   Genitourinary: Negative.   Musculoskeletal:  Positive for joint swelling.  Skin:  Negative for color change.  Allergic/Immunologic: Negative.   Neurological: Negative.   Hematological: Negative.   Psychiatric/Behavioral: Negative.       Objective:    Physical Exam Vitals and nursing note reviewed.  Constitutional:      Appearance: Normal appearance.  HENT:     Head: Normocephalic and atraumatic.     Right Ear: External ear normal.     Left Ear: External ear normal.     Nose: Nose normal.     Mouth/Throat:     Mouth: Mucous membranes are moist.     Pharynx: Oropharynx is clear.  Eyes:     Extraocular Movements: Extraocular movements intact.     Conjunctiva/sclera: Conjunctivae normal.     Pupils: Pupils are equal, round, and reactive to light.  Cardiovascular:     Rate and Rhythm: Normal rate and regular rhythm.     Heart sounds: Normal heart sounds.  Pulmonary:     Effort: Pulmonary effort is normal.     Breath sounds: Normal breath sounds.  Musculoskeletal:        General: Normal range of motion.     Cervical back: Normal range of motion and neck supple.  Feet:     Comments: See photos ; slight swelling noted left ankle, tender to light touch; all pulses intact; pain with ROM testing Skin:    General: Skin is warm and dry.  Neurological:     General: No focal deficit present.     Mental Status: She is alert and oriented to person, place, and time.  Psychiatric:        Mood and Affect: Mood normal.        Thought Content: Thought content normal.        Judgment:  Judgment normal.     Verbal consent given by patient to have photo included in chart   BP (!) 144/81 (BP Location: Left Arm, Patient Position: Sitting, Cuff Size: Large)    Pulse 84    Temp 98.2 F (36.8 C) (Oral)    Resp 18    Ht $R'5\' 1"'ql$  (1.549 m)    Wt 206 lb (93.4 kg)    LMP  03/13/2021    SpO2 100%    BMI 38.92 kg/m  Wt Readings from Last 3 Encounters:  03/15/21 206 lb (93.4 kg)  02/15/21 202 lb 9.6 oz (91.9 kg)  01/26/21 201 lb 9.6 oz (91.4 kg)     Health Maintenance Due  Topic Date Due   COVID-19 Vaccine (1) Never done   Hepatitis C Screening  Never done    There are no preventive care reminders to display for this patient.  Lab Results  Component Value Date   TSH 2.530 12/09/2020   Lab Results  Component Value Date   WBC 9.7 03/12/2021   HGB 10.8 (L) 03/12/2021   HCT 33.6 (L) 03/12/2021   MCV 84.0 03/12/2021   PLT 461 (H) 03/12/2021   Lab Results  Component Value Date   NA 138 05/20/2020   K 4.5 05/20/2020   CO2 23 05/20/2020   GLUCOSE 92 05/20/2020   BUN 5 (L) 05/20/2020   CREATININE 0.75 05/20/2020   BILITOT 0.3 05/20/2020   ALKPHOS 55 05/20/2020   AST 19 05/20/2020   ALT 15 05/20/2020   PROT 6.9 05/20/2020   ALBUMIN 4.1 05/20/2020   CALCIUM 9.1 05/20/2020   ANIONGAP 9 08/13/2019   EGFR 99 05/20/2020   Lab Results  Component Value Date   CHOL 184 05/20/2020   Lab Results  Component Value Date   HDL 63 05/20/2020   Lab Results  Component Value Date   LDLCALC 107 (H) 05/20/2020   Lab Results  Component Value Date   TRIG 78 05/20/2020   Lab Results  Component Value Date   CHOLHDL 2.9 05/20/2020   Lab Results  Component Value Date   HGBA1C 5.5 05/18/2020      Assessment & Plan:   Problem List Items Addressed This Visit   None Visit Diagnoses     Left foot pain    -  Primary   Relevant Orders   Ambulatory referral to Orthopedic Surgery   Left ankle swelling           No orders of the defined types were placed in this  encounter. 1. Left foot pain Patient has already picked up prescription for indomethacin, indomethacin can also be used to treat acute pain, encouraged patient to do with trial of indomethacin, patient education given on supportive care, red flags for prompt reevaluation - Ambulatory referral to Orthopedic Surgery  2. Left ankle swelling    I have reviewed the patient's medical history (PMH, PSH, Social History, Family History, Medications, and allergies) , and have been updated if relevant. I spent 20 minutes reviewing chart and  face to face time with patient.     Follow-up: Return if symptoms worsen or fail to improve.    Loraine Grip Mayers, PA-C

## 2021-03-15 NOTE — Progress Notes (Signed)
Patient has not eaten or taken medication today. Patient reports L foot pain from gout being present and scaled at a 6.

## 2021-03-15 NOTE — Patient Instructions (Signed)
I do encourage you to use the indomethacin as directed, use some bracing and keep your feet elevated when possible.  I have started a referral for you to be seen by orthopedics for further evaluation.  Please let us know if there is anything else we can do for you.  Kennieth Rad, PA-C Physician Assistant [facility] [email or web address]   Ankle Pain The ankle joint holds your body weight and allows you to move around. Ankle pain can occur on either side or the back of one ankle or both ankles. Ankle pain may be sharp and burning or dull and aching. There may be tenderness, stiffness, redness, or warmth around the ankle. Many things can cause ankle pain, including an injury to the area and overuse of the ankle. Follow these instructions at home: Activity Rest your ankle as told by your health care provider. Avoid any activities that cause ankle pain. Do not use the injured limb to support your body weight until your health care provider says that you can. Use crutches as told by your health care provider. Do exercises as told by your health care provider. Ask your health care provider when it is safe to drive if you have a brace on your ankle. If you have a brace: Wear the brace as told by your health care provider. Remove it only as told by your health care provider. Loosen the brace if your toes tingle, become numb, or turn cold and blue. Keep the brace clean. If the brace is not waterproof: Do not let it get wet. Cover it with a watertight covering when you take a bath or shower. If you were given an elastic bandage:  Remove it when you take a bath or a shower. Try not to move your ankle very much, but wiggle your toes from time to time. This helps to prevent swelling. Adjust the bandage to make it more comfortable if it feels too tight. Loosen the bandage if you have numbness or tingling in your foot or if your foot turns cold  and blue. Managing pain, stiffness, and swelling  If directed, put ice on the painful area. If you have a removable brace or elastic bandage, remove it as told by your health care provider. Put ice in a plastic bag. Place a towel between your skin and the bag. Leave the ice on for 20 minutes, 2-3 times a day. Move your toes often to avoid stiffness and to lessen swelling. Raise (elevate) your ankle above the level of your heart while you are sitting or lying down. General instructions Record information about your pain. Writing down the following may be helpful for you and your health care provider: How often you have ankle pain. Where the pain is located. What the pain feels like. If treatment involves wearing a prescribed shoe or insole, make sure you wear it correctly and for as long as told by your health care provider. Take over-the-counter and prescription medicines only as told by your health care provider. Keep all follow-up visits as told by your health care provider. This is important. Contact a health care provider if: Your pain gets worse. Your pain is not relieved with medicines. You have a fever or chills. You are having more trouble with walking. You have new symptoms. Get help right away if: Your foot, leg, toes, or ankle: Tingles or becomes numb. Becomes swollen. Turns pale or blue. Summary Ankle pain can occur on either side or the back of one  ankle or both ankles. Ankle pain may be sharp and burning or dull and aching. Rest your ankle as told by your health care provider. If told, apply ice to the area. Take over-the-counter and prescription medicines only as told by your health care provider. This information is not intended to replace advice given to you by your health care provider. Make sure you discuss any questions you have with your health care provider. Document Revised: 04/01/2020 Document Reviewed: 04/01/2020 Elsevier Patient Education  2022 [employer].

## 2021-03-22 ENCOUNTER — Telehealth (INDEPENDENT_AMBULATORY_CARE_PROVIDER_SITE_OTHER): Payer: Self-pay | Admitting: Primary Care

## 2021-03-22 NOTE — Telephone Encounter (Signed)
Per CRM Pt requesting to have appt with Ms. Leilani Merl

## 2021-03-23 ENCOUNTER — Ambulatory Visit: Payer: Managed Care, Other (non HMO) | Admitting: Family

## 2021-03-24 ENCOUNTER — Other Ambulatory Visit: Payer: Self-pay

## 2021-03-24 ENCOUNTER — Encounter (INDEPENDENT_AMBULATORY_CARE_PROVIDER_SITE_OTHER): Payer: Self-pay | Admitting: Primary Care

## 2021-03-24 ENCOUNTER — Ambulatory Visit (INDEPENDENT_AMBULATORY_CARE_PROVIDER_SITE_OTHER): Payer: Managed Care, Other (non HMO) | Admitting: Primary Care

## 2021-03-24 VITALS — BP 140/93 | HR 61 | Temp 97.9°F | Ht 61.0 in | Wt 204.0 lb

## 2021-03-24 DIAGNOSIS — Z1331 Encounter for screening for depression: Secondary | ICD-10-CM

## 2021-03-24 DIAGNOSIS — I10 Essential (primary) hypertension: Secondary | ICD-10-CM

## 2021-03-24 DIAGNOSIS — M2550 Pain in unspecified joint: Secondary | ICD-10-CM | POA: Diagnosis not present

## 2021-03-24 MED ORDER — SERTRALINE HCL 50 MG PO TABS: ORAL_TABLET | ORAL | 0 refills | Status: DC

## 2021-03-24 MED ORDER — HYDROCHLOROTHIAZIDE 25 MG PO TABS: 25.0000 mg | ORAL_TABLET | Freq: Every day | ORAL | 3 refills | Status: DC

## 2021-03-24 NOTE — Patient Instructions (Signed)

## 2021-03-26 LAB — RHEUMATOID ARTHRITIS PROFILE
Cyclic Citrullin Peptide Ab: 3 units (ref 0–19)
Rheumatoid fact SerPl-aCnc: <10 IU/mL (ref <14.0)

## 2021-03-28 NOTE — Progress Notes (Signed)
[place], is a 46 y.o. female  ENI:[id number]  IRW:[id number]  DOB - [birth year]  Chief Complaint  Patient presents with   Medication Problem    Feels horrible on zoloft it was helping but is no longer helping    Annual Exam       Subjective:   Michele Trevino is a 46 y.o. female here today for a follow up visit. Patient has No headache, No chest pain, No abdominal pain - No Nausea, No new weakness tingling or numbness, No Cough - SOB.  No problems updated.  ALLERGIES: Allergies  Allergen Reactions   Latex Swelling    PAST MEDICAL HISTORY: Past Medical History:  Diagnosis Date   Arthritis    Asthma    Carpal tunnel syndrome    Morbid obesity ([place])    Spinal stenosis     MEDICATIONS AT HOME: Prior to Admission medications   Medication Sig Start Date End Date Taking? Authorizing Provider  albuterol (VENTOLIN HFA) 108 (90 Base) MCG/ACT inhaler Inhale 1-2 puffs into the lungs every 6 (six) hours as needed for wheezing or shortness of breath. 05/18/20  Yes Kerin Perna, NP  cetirizine-pseudoephedrine (ZYRTEC-D ALLERGY & CONGESTION) 5-120 MG tablet Take 1 tablet by mouth 2 (two) times daily. 09/14/20  Yes Kerin Perna, NP  fluticasone (FLONASE) 50 MCG/ACT nasal spray Place 2 sprays into both nostrils daily. 09/14/20  Yes Kerin Perna, NP  hydrochlorothiazide (HYDRODIURIL) 25 MG tablet Take 1 tablet (25 mg total) by mouth daily. 03/24/21  Yes Kerin Perna, NP  albuterol (PROVENTIL) (2.5 MG/3ML) 0.083% nebulizer solution Take 3 mLs (2.5 mg total) by nebulization every 6 (six) hours as needed for wheezing or shortness of breath. 07/02/19 07/01/20  Elsie Stain, MD  sertraline (ZOLOFT) 50 MG tablet TAKE 1 TABLET BY MOUTH AT BEDTIME 03/24/21   Kerin Perna, NP    Objective:   Vitals:   03/24/21 1504  BP: (!) 140/93  Pulse: 61  Temp: 97.9 F (36.6 C)  TempSrc: Oral  SpO2: 95%  Weight: 204 lb  (92.5 kg)  Height: 5\' 1"  (1.549 m)   Exam General appearance : Awake, alert, not in any distress. Speech Clear. Not toxic looking HEENT: Atraumatic and Normocephalic, pupils equally reactive to light and accomodation Neck: Supple, no JVD. No cervical lymphadenopathy.  Chest: Good air entry bilaterally, no added sounds  CVS: S1 S2 regular, no murmurs.  Abdomen: Bowel sounds present, Non tender and not distended with no gaurding, rigidity or rebound. Extremities: B/L Lower Ext shows no edema, both legs are warm to touch Neurology: Awake alert, and oriented X 3, CN II-XII intact, Non focal Skin: No Rash  Data Review Lab Results  Component Value Date   HGBA1C 5.5 05/18/2020   HGBA1C 5.9 (A) 08/14/2018   HGBA1C 5.7 (H) 12/28/2017    Assessment & Plan   1. Positive depression screening - sertraline (ZOLOFT) 50 MG tablet; TAKE 1 TABLET BY MOUTH AT BEDTIME  Dispense: 90 tablet; Refill: 0  2. Arthralgia, unspecified joint - Rheumatoid Arthritis Profile  3. Essential hypertension BP goal - < 130/80 Explained that having normal blood pressure is the goal and medications are helping to get to goal and maintain normal blood pressure. DIET: Limit salt intake, read nutrition labels to check salt content, limit fried and high fatty foods  Avoid using multisymptom OTC cold preparations that generally contain sudafed which can rise BP. Consult with pharmacist on best cold relief  products to use for persons with HTN EXERCISE Discussed incorporating exercise such as walking - 30 minutes most days of the week and can do in 10 minute intervals    - hydrochlorothiazide (HYDRODIURIL) 25 MG tablet; Take 1 tablet (25 mg total) by mouth daily.  Dispense: 90 tablet; Refill: 3  Patient have been counseled extensively about nutrition and exercise. Other issues discussed during this visit include: low cholesterol diet, weight control and daily exercise, foot care, annual eye examinations at Ophthalmology,  importance of adherence with medications and regular follow-up. We also discussed long term complications of uncontrolled diabetes and hypertension.   Return in about 4 weeks (around 04/21/2021) for BP f/u.  The patient was given clear instructions to go to ER or return to medical center if symptoms don't improve, worsen or new problems develop. The patient verbalized understanding. The patient was told to call to get lab results if they haven't heard anything in the next week.   This note has been created with Surveyor, quantity. Any transcriptional errors are unintentional.   Kerin Perna, NP 03/28/2021, 11:19 AM

## 2021-03-29 ENCOUNTER — Ambulatory Visit: Payer: Managed Care, Other (non HMO) | Admitting: Family

## 2021-04-07 ENCOUNTER — Ambulatory Visit (INDEPENDENT_AMBULATORY_CARE_PROVIDER_SITE_OTHER): Payer: Managed Care, Other (non HMO) | Admitting: Clinical

## 2021-04-21 ENCOUNTER — Ambulatory Visit (INDEPENDENT_AMBULATORY_CARE_PROVIDER_SITE_OTHER): Payer: Managed Care, Other (non HMO) | Admitting: Primary Care

## 2021-04-21 ENCOUNTER — Ambulatory Visit (INDEPENDENT_AMBULATORY_CARE_PROVIDER_SITE_OTHER): Payer: Managed Care, Other (non HMO) | Admitting: Clinical

## 2021-04-28 ENCOUNTER — Other Ambulatory Visit: Payer: Self-pay

## 2021-04-28 ENCOUNTER — Ambulatory Visit (INDEPENDENT_AMBULATORY_CARE_PROVIDER_SITE_OTHER): Payer: Managed Care, Other (non HMO) | Admitting: Orthopedic Surgery

## 2021-04-28 DIAGNOSIS — M25572 Pain in left ankle and joints of left foot: Secondary | ICD-10-CM

## 2021-04-28 DIAGNOSIS — M76822 Posterior tibial tendinitis, left leg: Secondary | ICD-10-CM

## 2021-05-01 ENCOUNTER — Encounter: Payer: Self-pay | Admitting: Orthopedic Surgery

## 2021-05-01 NOTE — Progress Notes (Signed)
? ?Office Visit Note ?  ?Patient: Michele Trevino           ?Date of Birth: 1975-10-29           ?MRN: [id number] ?Visit Date: 04/28/2021 ?             ?Requested by: Mayers, Loraine Grip, PA-C ?[address] ?[address] ?[place],  [place] [address] ?PCP: Kerin Perna, NP ? ?Chief Complaint  ?Patient presents with  ? Left Foot - Pain  ? ? ? ? ?HPI: ?Patient is a 46 year old woman who is seen for initial evaluation for left foot pain.  She was seen at urgent care on January 21 and radiographs were obtained.  Patient denies any specific injury she states she woke up with pain and swelling.  Patient's uric acid was 3.7 rheumatoid factor less than 10 sed rate 13. ? ?Assessment & Plan: ?Visit Diagnoses:  ?1. Pain in left ankle and joints of left foot   ? ? ?Plan: Patient has posterior tibial tendon insufficiency.  We will place her in a fracture boot recommended Voltaren gel. ? ?Follow-Up Instructions: Return in about 4 weeks (around 05/26/2021).  ? ?Ortho Exam ? ?Patient is alert, oriented, no adenopathy, well-dressed, normal affect, normal respiratory effort. ?Examination patient has good pulses there is no pain to palpation over the plantar fascia she does have a pronated valgus foot with pes planus.  Patient has pain to palpation over the posterior tibial tendon and swelling.  She cannot do a single limb heel raise. ? ?Imaging: ?No results found. ?No images are attached to the encounter. ? ?Labs: ?Lab Results  ?Component Value Date  ? HGBA1C 5.5 05/18/2020  ? HGBA1C 5.9 (A) 08/14/2018  ? HGBA1C 5.7 (H) 12/28/2017  ? ESRSEDRATE 13 03/12/2021  ? LABURIC 3.7 03/12/2021  ? REPTSTATUS 10/05/2020 FINAL 10/04/2020  ? CULT MULTIPLE SPECIES PRESENT, SUGGEST RECOLLECTION (A) 10/04/2020  ? LABORGA ENTEROBACTER AEROGENES (A) 12/01/2018  ? LABORGA ESCHERICHIA COLI (A) 12/01/2018  ? ? ? ?Lab Results  ?Component Value Date  ? ALBUMIN 4.1 05/20/2020  ? ALBUMIN 3.5 08/13/2019  ? ALBUMIN 3.5 12/01/2018  ? ? ?No results found for:  MG ?Lab Results  ?Component Value Date  ? VD25OH 41.3 12/09/2020  ? VD25OH 23.4 (L) 08/14/2018  ? ? ?No results found for: PREALBUMIN ?CBC EXTENDED Latest Ref Rng & Units 03/12/2021 12/09/2020 05/20/2020  ?WBC 4.0 - 10.5 K/uL 9.7 9.6 8.3  ?RBC 3.87 - 5.11 MIL/uL 4.00 4.13 3.96  ?HGB 12.0 - 15.0 g/dL 10.8(L) 10.9(L) 10.0(L)  ?HCT 36.0 - 46.0 % 33.6(L) 33.7(L) 32.2(L)  ?PLT 150 - 400 K/uL 461(H) 459(H) 459(H)  ?NEUTROABS 1.7 - 7.7 K/uL 4.6 3.8 3.7  ?LYMPHSABS 0.7 - 4.0 K/uL 4.1(H) 4.5(H) 3.6(H)  ? ? ? ?There is no height or weight on file to calculate BMI. ? ?Orders:  ?No orders of the defined types were placed in this encounter. ? ?No orders of the defined types were placed in this encounter. ? ? ? Procedures: ?No procedures performed ? ?Clinical Data: ?No additional findings. ? ?ROS: ? ?All other systems negative, except as noted in the HPI. ?Review of Systems ? ?Objective: ?Vital Signs: There were no vitals taken for this visit. ? ?Specialty Comments:  ?No specialty comments available. ? ?PMFS History: ?Patient Active Problem List  ? Diagnosis Date Noted  ? GERD (gastroesophageal reflux disease) 07/28/2019  ? History of COVID-19 07/02/2019  ? Anxiety 07/02/2019  ? Bronchiolitis 07/02/2019  ? Acute non-recurrent maxillary  sinusitis 07/02/2019  ? Morbid obesity ([place])   ? Mixed incontinence 03/21/2019  ? UTERINE FIBROID 04/19/2006  ? ?Past Medical History:  ?Diagnosis Date  ? Arthritis   ? Asthma   ? Carpal tunnel syndrome   ? Morbid obesity ([place])   ? Spinal stenosis   ?  ?Family History  ?Problem Relation Age of Onset  ? Lung cancer Mother   ? Diabetes Father   ? Hypertension Father   ? Throat cancer Maternal Aunt   ?  ?Past Surgical History:  ?Procedure Laterality Date  ? CESAREAN SECTION  [id number]  ? x 3  ? TUBAL LIGATION    ? ?Social History  ? ?Occupational History  ?  Comment: NA  ?Tobacco Use  ? Smoking status: Former  ?  Types: Cigarettes  ?  Quit date: 2005  ?  Years since quitting: 18.2  ? Smokeless  tobacco: Never  ?Vaping Use  ? Vaping Use: Never used  ?Substance and Sexual Activity  ? Alcohol use: Yes  ?  Comment: socially   ? Drug use: No  ? Sexual activity: Yes  ?  Birth control/protection: Surgical  ? ? ? ? ? ?

## 2021-05-04 ENCOUNTER — Ambulatory Visit (INDEPENDENT_AMBULATORY_CARE_PROVIDER_SITE_OTHER): Payer: Managed Care, Other (non HMO) | Admitting: Primary Care

## 2021-08-02 ENCOUNTER — Other Ambulatory Visit (INDEPENDENT_AMBULATORY_CARE_PROVIDER_SITE_OTHER): Payer: Self-pay | Admitting: Primary Care

## 2021-08-02 DIAGNOSIS — Z1331 Encounter for screening for depression: Secondary | ICD-10-CM

## 2021-08-02 NOTE — Telephone Encounter (Signed)
Routed to PCP 

## 2021-11-02 ENCOUNTER — Ambulatory Visit (INDEPENDENT_AMBULATORY_CARE_PROVIDER_SITE_OTHER): Payer: Self-pay | Admitting: *Deleted

## 2021-11-02 ENCOUNTER — Ambulatory Visit (INDEPENDENT_AMBULATORY_CARE_PROVIDER_SITE_OTHER): Payer: Commercial Managed Care - HMO | Admitting: Primary Care

## 2021-11-02 NOTE — Telephone Encounter (Signed)
  Chief Complaint: UTI symptoms Symptoms: 2 weeks, dysuria Frequency: 2 weeks Pertinent Negatives: Patient denies fever Disposition: '[]'$ ED /'[]'$ Urgent Care (no appt availability in office) / '[x]'$ Appointment(In office/virtual)/ '[]'$  [facility]/ '[]'$ Home Care/ '[]'$ Refused Recommended Disposition /'[]'$ [facility]/ '[]'$  Follow-up with PCP Additional Notes: Appt this morning, home care discussed.   Reason for Disposition  [1] Can't control passage of urine (i.e., urinary incontinence) AND [2] new-onset (< 2 weeks) or worsening  Answer Assessment - Initial Assessment Questions 1. SYMPTOM: "What's the main symptom you're concerned about?" (e.g., frequency, incontinence)     pain 2. ONSET: "When did the  UTI  start?"     2 weeks 3. PAIN: "Is there any pain?" If Yes, ask: "How bad is it?" (Scale: 1-10; mild, moderate, severe)     Not bad but will not go away 4. CAUSE: "What do you think is causing the symptoms?"     uti 5. OTHER SYMPTOMS: "Do you have any other symptoms?" (e.g., blood in urine, fever, flank pain, pain with urination)     No blood, no fever, just pain 6. PREGNANCY: "Is there any chance you are pregnant?" "When was your last menstrual period?"     No  Protocols used: Urinary Symptoms-A-AH

## 2021-11-02 NOTE — Telephone Encounter (Signed)
Pt called for following:  Summary: poss UTI   Pt has pain w/ urination and would like abx called into her pharmacy.      Voicemail left

## 2021-11-18 ENCOUNTER — Encounter (HOSPITAL_COMMUNITY): Payer: Self-pay | Admitting: *Deleted

## 2021-11-18 ENCOUNTER — Ambulatory Visit (HOSPITAL_COMMUNITY)
Admission: EM | Admit: 2021-11-18 | Discharge: 2021-11-18 | Disposition: A | Discharge status: Home / Self Care | Payer: Medicaid Other | Attending: Family Medicine | Admitting: Family Medicine

## 2021-11-18 DIAGNOSIS — N644 Mastodynia: Secondary | ICD-10-CM

## 2021-11-18 DIAGNOSIS — Z3202 Encounter for pregnancy test, result negative: Secondary | ICD-10-CM

## 2021-11-18 LAB — POC URINE PREG, ED: Preg Test, Ur: NEGATIVE

## 2021-11-18 MED ORDER — CEPHALEXIN 500 MG PO CAPS: 500.0000 mg | ORAL_CAPSULE | Freq: Three times a day (TID) | ORAL | 0 refills | Status: AC

## 2021-11-18 NOTE — ED Triage Notes (Signed)
Pt states she has a black mark on her right breast on the underneath she noticed it Monday and it is tender to the touch.

## 2021-11-18 NOTE — Discharge Instructions (Addendum)
The pregnancy test was negative  Take cephalexin 500 mg--1 capsule 3 times daily for 7 days  Diagnostic mammogram of the right side is ordered.  Lease follow-up with your primary care, especially if this is not improving

## 2021-11-18 NOTE — ED Provider Notes (Signed)
[place]    CSN: [id number] Arrival date & time: 11/18/21  0845      History   Chief Complaint Chief Complaint  Patient presents with   Breast Pain    HPI Michele Trevino is a 46 y.o. female.   HPI Here for a sore spot on her right medial breast.  She first noticed it on September 25.  She first noticed a hyper pigmented area on the right lower medial breast.  It is tender to the touch in that area.  No ulceration and no other skin changes.  No nipple discharge.  No fever or chills since this began  Last mammogram was November of 2022.  Last menstrual cycle was June or July.  No hot flashes.  She has had a tubal ligation   Past Medical History:  Diagnosis Date   Arthritis    Asthma    Carpal tunnel syndrome    Morbid obesity ([place])    Spinal stenosis     Patient Active Problem List   Diagnosis Date Noted   GERD (gastroesophageal reflux disease) 07/28/2019   History of COVID-19 07/02/2019   Anxiety 07/02/2019   Bronchiolitis 07/02/2019   Acute non-recurrent maxillary sinusitis 07/02/2019   Morbid obesity ([place])    Mixed incontinence 03/21/2019   UTERINE FIBROID 04/19/2006    Past Surgical History:  Procedure Laterality Date   CESAREAN SECTION  [phone]   x 3   TUBAL LIGATION      OB History     Gravida  8   Para  4   Term      Preterm      AB  4   Living  4      SAB      IAB      Ectopic      Multiple      Live Births  4            Home Medications    Prior to Admission medications   Medication Sig Start Date End Date Taking? Authorizing Provider  albuterol (VENTOLIN HFA) 108 (90 Base) MCG/ACT inhaler Inhale 1-2 puffs into the lungs every 6 (six) hours as needed for wheezing or shortness of breath. 05/18/20  Yes Kerin Perna, NP  cephALEXin (KEFLEX) 500 MG capsule Take 1 capsule (500 mg total) by mouth 3 (three) times daily for 7 days. 11/18/21 11/25/21 Yes Barrett Henle, MD  sertraline  (ZOLOFT) 50 MG tablet TAKE 1 TABLET BY MOUTH AT BEDTIME 08/03/21  Yes Kerin Perna, NP  albuterol (PROVENTIL) (2.5 MG/3ML) 0.083% nebulizer solution Take 3 mLs (2.5 mg total) by nebulization every 6 (six) hours as needed for wheezing or shortness of breath. 07/02/19 07/01/20  Elsie Stain, MD  cetirizine-pseudoephedrine (ZYRTEC-D ALLERGY & CONGESTION) 5-120 MG tablet Take 1 tablet by mouth 2 (two) times daily. 09/14/20   Kerin Perna, NP  fluticasone (FLONASE) 50 MCG/ACT nasal spray Place 2 sprays into both nostrils daily. 09/14/20   Kerin Perna, NP  hydrochlorothiazide (HYDRODIURIL) 25 MG tablet Take 1 tablet (25 mg total) by mouth daily. 03/24/21   Kerin Perna, NP    Family History Family History  Problem Relation Age of Onset   Lung cancer Mother    Diabetes Father    Hypertension Father    Throat cancer Maternal Aunt     Social History Social History   Tobacco Use   Smoking status: Former    Types: Cigarettes  Quit date: 2005    Years since quitting: 18.7   Smokeless tobacco: Never  Vaping Use   Vaping Use: Never used  Substance Use Topics   Alcohol use: Yes    Comment: socially    Drug use: No     Allergies   Latex   Review of Systems Review of Systems   Physical Exam Triage Vital Signs ED Triage Vitals  Enc Vitals Group     BP 11/18/21 0944 (!) 149/91     Pulse Rate 11/18/21 0944 66     Resp 11/18/21 0944 18     Temp 11/18/21 0944 98.6 F (37 C)     Temp Source 11/18/21 0944 Oral     SpO2 11/18/21 0944 97 %     Weight --      Height --      Head Circumference --      Peak Flow --      Pain Score 11/18/21 0941 3     Pain Loc --      Pain Edu? --      Excl. in [place]? --    No data found.  Updated Vital Signs BP (!) 149/91 (BP Location: Left Arm)   Pulse 66   Temp 98.6 F (37 C) (Oral)   Resp 18   LMP 03/13/2021   SpO2 97%   Visual Acuity Right Eye Distance:   Left Eye Distance:   Bilateral Distance:    Right  Eye Near:   Left Eye Near:    Bilateral Near:     Physical Exam Vitals reviewed.  Constitutional:      General: She is not in acute distress.    Appearance: She is not ill-appearing, toxic-appearing or diaphoretic.  Chest:       Comments: There is an area that is slightly hyperpigmented on her right breast on the medial lower quadrant.  The hyperpigmentation is about half a centimeter in diameter.  There is no erythema or induration of the skin, but it is tender in area about 2 cm in diameter.  I am unable to appreciate a mass in this exam Neurological:     Mental Status: She is alert and oriented to person, place, and time.  Psychiatric:        Behavior: Behavior normal.      UC Treatments / Results  Labs (all labs ordered are listed, but only abnormal results are displayed) Labs Reviewed  POC URINE PREG, ED    EKG   Radiology No results found.  Procedures Procedures (including critical care time)  Medications Ordered in UC Medications - No data to display  Initial Impression / Assessment and Plan / UC Course  I have reviewed the triage vital signs and the nursing notes.  Pertinent labs & imaging results that were available during my care of the patient were reviewed by me and considered in my medical decision making (see chart for details).       UPT is negative Dx mmg ordered. Keflex rx'd in case this is mastitis. Discussed it could also be fibrocystic in origin. She will followup with her pcp Final Clinical Impressions(s) / UC Diagnoses   Final diagnoses:  Breast pain, right     Discharge Instructions      The pregnancy test was negative  Take cephalexin 500 mg--1 capsule 3 times daily for 7 days  Diagnostic mammogram of the right side is ordered.  Lease follow-up with your primary care, especially if this is  not improving     ED Prescriptions     Medication Sig Dispense Auth. Provider   cephALEXin (KEFLEX) 500 MG capsule Take 1 capsule  (500 mg total) by mouth 3 (three) times daily for 7 days. 21 capsule Barrett Henle, MD      PDMP not reviewed this encounter.   Barrett Henle, MD 11/18/21 1046

## 2021-11-22 ENCOUNTER — Other Ambulatory Visit (HOSPITAL_COMMUNITY): Payer: Self-pay | Admitting: Family Medicine

## 2021-11-22 DIAGNOSIS — N644 Mastodynia: Secondary | ICD-10-CM

## 2021-11-23 ENCOUNTER — Other Ambulatory Visit: Payer: Self-pay | Admitting: *Deleted

## 2021-11-23 DIAGNOSIS — N6310 Unspecified lump in the right breast, unspecified quadrant: Secondary | ICD-10-CM

## 2021-12-13 ENCOUNTER — Other Ambulatory Visit: Payer: Medicaid Other

## 2021-12-13 ENCOUNTER — Ambulatory Visit: Payer: Medicaid Other

## 2022-01-03 ENCOUNTER — Ambulatory Visit: Payer: Self-pay | Admitting: *Deleted

## 2022-01-03 ENCOUNTER — Ambulatory Visit
Admission: RE | Admit: 2022-01-03 | Discharge: 2022-01-03 | Disposition: A | Discharge status: Home / Self Care | Payer: Medicaid Other | Source: Ambulatory Visit | Attending: Obstetrics and Gynecology | Admitting: Obstetrics and Gynecology

## 2022-01-03 ENCOUNTER — Ambulatory Visit
Admission: RE | Admit: 2022-01-03 | Discharge: 2022-01-03 | Disposition: A | Discharge status: Home / Self Care | Payer: No Typology Code available for payment source | Source: Ambulatory Visit | Attending: Obstetrics and Gynecology | Admitting: Obstetrics and Gynecology

## 2022-01-03 VITALS — BP 131/86 | Wt 204.0 lb

## 2022-01-03 DIAGNOSIS — N6310 Unspecified lump in the right breast, unspecified quadrant: Secondary | ICD-10-CM

## 2022-01-03 DIAGNOSIS — N644 Mastodynia: Secondary | ICD-10-CM

## 2022-01-03 DIAGNOSIS — Z1239 Encounter for other screening for malignant neoplasm of breast: Secondary | ICD-10-CM

## 2022-01-03 NOTE — Patient Instructions (Signed)
Explained breast self awareness with [name]. Patient did not need a Pap smear today due to last Pap smear and HPV typing  was 03/17/2019. Let her know BCCCP will cover Pap smears and HPV typing every 5 years unless has a history of abnormal Pap smears. Referred patient to the [facility] for a diagnostic mammogram. Appointment scheduled Tuesday, January 03, 2022 at 1400. Patient aware of appointment and will be there. Darrol Poke [place] verbalized understanding.  Guy Seese, Arvil Chaco, RN 12:33 PM

## 2022-01-03 NOTE — Progress Notes (Signed)
[name] is a 46 y.o. female who presents to [facility] clinic today with complaint of right inner breast superficial lump that is darkened x 3-4 weeks. Patient denied any pain or discharge.    Pap Smear: Pap smear not completed today. Last Pap smear was 03/17/2019 at [facility] clinic and was normal with negative HPV. Patient has a history of a normal Pap smear that was positive for HPV on 06/28/2017. Last two Pap smear results are available in Epic.    Physical exam: Breasts Breasts symmetrical. No skin abnormalities left breast. Observed a slightly darkened area right inner breast at 9 o'clock. No nipple retraction bilateral breasts. No nipple discharge bilateral breasts. No lymphadenopathy. No lumps palpated bilateral breasts. Unable to palpate a lump in patients area of concern. Complaints of bilateral inner breast pain on exam.      MM 3D SCREEN BREAST BILATERAL  Result Date: 12/30/2020 CLINICAL DATA:  Screening. EXAM: DIGITAL SCREENING BILATERAL MAMMOGRAM WITH TOMOSYNTHESIS AND CAD TECHNIQUE: Bilateral screening digital craniocaudal and mediolateral oblique mammograms were obtained. Bilateral screening digital breast tomosynthesis was performed. The images were evaluated with computer-aided detection. COMPARISON:  Previous exam(s). ACR Breast Density Category b: There are scattered areas of fibroglandular density. FINDINGS: There are no findings suspicious for malignancy. IMPRESSION: No mammographic evidence of malignancy. A result letter of this screening mammogram will be mailed directly to the patient. RECOMMENDATION: Screening mammogram in one year. (Code:SM-B-01Y) BI-RADS CATEGORY  1: Negative. Electronically Signed   By: Ileana Roup M.D.   On: 12/30/2020 11:08   MS DIGITAL DIAG TOMO BILAT  Result Date: 09/16/2019 CLINICAL DATA:  46 year old patient with bilateral diffuse breast pain. EXAM: DIGITAL DIAGNOSTIC BILATERAL MAMMOGRAM WITH TOMO AND CAD COMPARISON:  Previous exam(s). ACR  Breast Density Category b: There are scattered areas of fibroglandular density. FINDINGS: No mass, architectural distortion, or suspicious microcalcification is identified to suggest malignancy in either breast. Mammographic images were processed with CAD. IMPRESSION: No evidence of malignancy in either breast. RECOMMENDATION: Screening mammogram in one year.(Code:SM-B-01Y) I have discussed the findings and recommendations with the patient. If applicable, a reminder letter will be sent to the patient regarding the next appointment. BI-RADS CATEGORY  1: Negative. Electronically Signed   By: Curlene Dolphin M.D.   On: 09/16/2019 16:08   MS DIGITAL SCREENING TOMO BILATERAL  Result Date: 02/12/2019 CLINICAL DATA:  Screening. EXAM: DIGITAL SCREENING BILATERAL MAMMOGRAM WITH TOMO AND CAD COMPARISON:  Previous exam(s). ACR Breast Density Category b: There are scattered areas of fibroglandular density. FINDINGS: There are no findings suspicious for malignancy. Images were processed with CAD. IMPRESSION: No mammographic evidence of malignancy. A result letter of this screening mammogram will be mailed directly to the patient. RECOMMENDATION: Screening mammogram in one year. (Code:SM-B-01Y) BI-RADS CATEGORY  1: Negative. Electronically Signed   By: Kristopher Oppenheim M.D.   On: 02/12/2019 09:54   MM DIAG BREAST TOMO BILATERAL  Result Date: 11/20/2017 CLINICAL DATA:  46 year old patient recently seen in the [facility] clinic for evaluation of some recent erythema of the skin of the superior right breast. Patient reports that the skin changes have improved. Today, she shows me a photograph on her phone of several small rounded erythematous lesions on the skin of the upper right breast. No mass is palpated in either breast. EXAM: DIGITAL DIAGNOSTIC BILATERAL MAMMOGRAM WITH CAD AND TOMO COMPARISON:  None available. The patient reports a mammogram approximately 1 year ago in [place]. ACR Breast Density Category b: There are  scattered areas of  fibroglandular density. FINDINGS: No mass, architectural distortion, or suspicious microcalcification is identified to suggest malignancy in either breast. On physical exam, there is an approximately 1 cm focal area of slight hyperpigmentation of the far upper inner right breast. This is flat and smooth 2 palpation. There is no erythema, and no sign of infection. Mammographic images were processed with CAD. IMPRESSION: No evidence of malignancy in either breast. Improving focal skin changes in the upper inner right breast, of uncertain etiology, per patient report. RECOMMENDATION: Screening mammogram in one year.(Code:SM-B-01Y) I have discussed the findings and recommendations with the patient. Results were also provided in writing at the conclusion of the visit. If applicable, a reminder letter will be sent to the patient regarding the next appointment. BI-RADS CATEGORY  1: Negative. Electronically Signed   By: Curlene Dolphin M.D.   On: 11/20/2017 14:41     Pelvic/Bimanual Pap is not indicated today per BCCCP guidelines.   Smoking History: Patient has never smoked.   Patient Navigation: Patient education provided. Access to services provided for patient through [facility] program.   Colorectal Cancer Screening: Per patient had had a colonoscopy completed in November 2022 at [place]. No complaints today.    Breast and Cervical Cancer Risk Assessment: Patient does not have family history of breast cancer, known genetic mutations, or radiation treatment to the chest before age 69. Patient does not have history of cervical dysplasia, immunocompromised, or DES exposure in-utero.  Risk Assessment   No risk assessment data for the current encounter  Risk Scores       09/16/2019   Last edited by: Demetrius Revel, LPN   5-year risk: 0.9 %   Lifetime risk: 9.2 %            A: BCCCP exam without pap smear Complaint of right breast lump and skin discoloration.  P: Referred  patient to the [facility] for a diagnostic mammogram. Appointment scheduled Tuesday, January 03, 2022 at 1400.  Loletta Parish, RN 01/03/2022 12:34 PM

## 2022-04-18 ENCOUNTER — Encounter: Payer: Self-pay | Admitting: Gastroenterology

## 2022-05-03 ENCOUNTER — Encounter (INDEPENDENT_AMBULATORY_CARE_PROVIDER_SITE_OTHER): Payer: Self-pay

## 2022-08-23 ENCOUNTER — Encounter (HOSPITAL_COMMUNITY): Payer: Self-pay | Admitting: Emergency Medicine

## 2022-08-23 ENCOUNTER — Ambulatory Visit (HOSPITAL_COMMUNITY)
Admission: EM | Admit: 2022-08-23 | Discharge: 2022-08-23 | Disposition: A | Discharge status: Home / Self Care | Payer: No Typology Code available for payment source | Attending: Family Medicine | Admitting: Family Medicine

## 2022-08-23 ENCOUNTER — Other Ambulatory Visit: Payer: Self-pay

## 2022-08-23 DIAGNOSIS — Z1152 Encounter for screening for COVID-19: Secondary | ICD-10-CM | POA: Insufficient documentation

## 2022-08-23 DIAGNOSIS — R5383 Other fatigue: Secondary | ICD-10-CM | POA: Insufficient documentation

## 2022-08-23 DIAGNOSIS — R519 Headache, unspecified: Secondary | ICD-10-CM | POA: Insufficient documentation

## 2022-08-23 DIAGNOSIS — R1030 Lower abdominal pain, unspecified: Secondary | ICD-10-CM | POA: Insufficient documentation

## 2022-08-23 LAB — COMPREHENSIVE METABOLIC PANEL
ALT: 21 U/L (ref 0–44)
AST: 22 U/L (ref 15–41)
Albumin: 3.8 g/dL (ref 3.5–5.0)
Alkaline Phosphatase: 51 U/L (ref 38–126)
Anion gap: 8 (ref 5–15)
BUN: 7 mg/dL (ref 6–20)
CO2: 27 mmol/L (ref 22–32)
Calcium: 9.1 mg/dL (ref 8.9–10.3)
Chloride: 102 mmol/L (ref 98–111)
Creatinine, Ser: 0.94 mg/dL (ref 0.44–1.00)
GFR, Estimated: >60 mL/min (ref >60)
Glucose, Bld: 97 mg/dL (ref 70–99)
Potassium: 4.1 mmol/L (ref 3.5–5.1)
Sodium: 137 mmol/L (ref 135–145)
Total Bilirubin: 0.4 mg/dL (ref 0.3–1.2)
Total Protein: 7.4 g/dL (ref 6.5–8.1)

## 2022-08-23 LAB — POCT URINE PREGNANCY: Preg Test, Ur: NEGATIVE

## 2022-08-23 LAB — CBC WITH DIFFERENTIAL/PLATELET
Abs Immature Granulocytes: 0.02 10*3/uL (ref 0.00–0.07)
Basophils Absolute: 0 10*3/uL (ref 0.0–0.1)
Basophils Relative: 0 %
Eosinophils Absolute: 0.3 10*3/uL (ref 0.0–0.5)
Eosinophils Relative: 3 %
HCT: 35.9 % — ABNORMAL LOW (ref 36.0–46.0)
Hemoglobin: 11.1 g/dL — ABNORMAL LOW (ref 12.0–15.0)
Immature Granulocytes: 0 %
Lymphocytes Relative: 45 %
Lymphs Abs: 4 10*3/uL (ref 0.7–4.0)
MCH: 25.6 pg — ABNORMAL LOW (ref 26.0–34.0)
MCHC: 30.9 g/dL (ref 30.0–36.0)
MCV: 82.9 fL (ref 80.0–100.0)
Monocytes Absolute: 0.7 10*3/uL (ref 0.1–1.0)
Monocytes Relative: 8 %
Neutro Abs: 3.8 10*3/uL (ref 1.7–7.7)
Neutrophils Relative %: 44 %
Platelets: 490 10*3/uL — ABNORMAL HIGH (ref 150–400)
RBC: 4.33 MIL/uL (ref 3.87–5.11)
RDW: 14.6 % (ref 11.5–15.5)
WBC: 8.8 10*3/uL (ref 4.0–10.5)
nRBC: 0 % (ref 0.0–0.2)

## 2022-08-23 LAB — POCT URINALYSIS DIP (MANUAL ENTRY)
Bilirubin, UA: NEGATIVE
Glucose, UA: NEGATIVE mg/dL
Leukocytes, UA: NEGATIVE
Nitrite, UA: NEGATIVE
Protein Ur, POC: NEGATIVE mg/dL
Spec Grav, UA: 1.025 (ref 1.010–1.025)
Urobilinogen, UA: 1 E.U./dL
pH, UA: 7 (ref 5.0–8.0)

## 2022-08-23 LAB — LIPASE, BLOOD: Lipase: 30 U/L (ref 11–51)

## 2022-08-23 NOTE — ED Triage Notes (Signed)
Pt states she is having body aches, HA and feeling fatigue. Pt states she thinks she has a UTI too due to having vaginal secretion.

## 2022-08-23 NOTE — Discharge Instructions (Signed)
You have had labs (COVID testing and blood work) sent today. We will call you with any significant abnormalities or if there is need to begin or change treatment or pursue further follow up.  You may also review your test results online through MyChart. If you do not have a MyChart account, instructions to sign up should be on your discharge paperwork.  You have been seen today for abdominal pain. Your evaluation was not suggestive of any emergent condition requiring medical intervention at this time. However, some abdominal problems make take more time to appear. Therefore, it is very important for you to pay attention to any new symptoms or worsening of your current condition.  Please return here or to the Emergency Department immediately should you begin to feel worse in any way or have any of the following symptoms: worsening abdominal pain, vomiting, inability to drink fluids, fevers, or shaking chills.

## 2022-08-24 LAB — SARS CORONAVIRUS 2 (TAT 6-24 HRS): SARS Coronavirus 2: NEGATIVE

## 2022-08-24 NOTE — ED Provider Notes (Signed)
[facility]    ASSESSMENT & PLAN:  1. Other fatigue   2. Acute intractable headache, unspecified headache type   3. Lower abdominal pain    U/A without obvious signs of infection. Mild anemia on CBC. Unclear cause of fatigue/HA. Might be viral. COVID negative. OTC tx as needed. Monitor symptoms.  Labs Reviewed  CBC WITH DIFFERENTIAL/PLATELET - Abnormal; Notable for the following components:      Result Value   Hemoglobin 11.1 (*)    HCT 35.9 (*)    MCH 25.6 (*)    Platelets 490 (*)    All other components within normal limits  POCT URINALYSIS DIP (MANUAL ENTRY) - Abnormal; Notable for the following components:   Ketones, POC UA trace (5) (*)    Blood, UA trace-intact (*)    All other components within normal limits  SARS CORONAVIRUS 2 (TAT 6-24 HRS)  COMPREHENSIVE METABOLIC PANEL  LIPASE, BLOOD  POCT URINE PREGNANCY   Urine culture sent. Will notify patient of any significant results. Will follow up with her PCP or here if not showing improvement over the next 48 hours, sooner if needed.  Outlined signs and symptoms indicating need for more acute intervention. Patient verbalized understanding. After Visit Summary given.  SUBJECTIVE:  Michele Trevino is a 47 y.o. female who complains of body aches, HA and feeling fatigue. Pt states she thinks she has a UTI too; mild vaginal discharge. Denies abd pain. No gross hematuria. Denies fever/n/v/d. Mild generalized HA today.  LMP: No LMP recorded. (Menstrual status: Oophorectomy).  OBJECTIVE:  Vitals:   08/23/22 1456 08/23/22 1457  BP: (!) 140/80   Pulse: 79   Resp: 18   Temp: 98 F (36.7 C)   TempSrc: Oral   SpO2: 98%   Weight:  93 kg  Height:  5\' 1"  (1.549 m)   General appearance: alert; no distress HENT: oropharynx: moist Lungs: unlabored respirations Abdomen: soft, non-tender; bowel sounds normal; no masses or organomegaly; no guarding or rebound tenderness Back: no CVA  tenderness Extremities: no edema; symmetrical with no gross deformities Skin: warm and dry Neurologic: normal gait Psychological: alert and cooperative; normal mood and affect  Results for orders placed or performed during the hospital encounter of 08/23/22  SARS CORONAVIRUS 2 (TAT 6-24 HRS) Anterior Nasal Swab   Specimen: Anterior Nasal Swab  Result Value Ref Range   SARS Coronavirus 2 NEGATIVE NEGATIVE  CBC with Differential/Platelet  Result Value Ref Range   WBC 8.8 4.0 - 10.5 K/uL   RBC 4.33 3.87 - 5.11 MIL/uL   Hemoglobin 11.1 (L) 12.0 - 15.0 g/dL   HCT 16.1 (L) 09.6 - 04.5 %   MCV 82.9 80.0 - 100.0 fL   MCH 25.6 (L) 26.0 - 34.0 pg   MCHC 30.9 30.0 - 36.0 g/dL   RDW 40.9 81.1 - 91.4 %   Platelets 490 (H) 150 - 400 K/uL   nRBC 0.0 0.0 - 0.2 %   Neutrophils Relative % 44 %   Neutro Abs 3.8 1.7 - 7.7 K/uL   Lymphocytes Relative 45 %   Lymphs Abs 4.0 0.7 - 4.0 K/uL   Monocytes Relative 8 %   Monocytes Absolute 0.7 0.1 - 1.0 K/uL   Eosinophils Relative 3 %   Eosinophils Absolute 0.3 0.0 - 0.5 K/uL   Basophils Relative 0 %   Basophils Absolute 0.0 0.0 - 0.1 K/uL   Immature Granulocytes 0 %   Abs Immature Granulocytes 0.02 0.00 - 0.07 K/uL  Comprehensive  metabolic panel  Result Value Ref Range   Sodium 137 135 - 145 mmol/L   Potassium 4.1 3.5 - 5.1 mmol/L   Chloride 102 98 - 111 mmol/L   CO2 27 22 - 32 mmol/L   Glucose, Bld 97 70 - 99 mg/dL   BUN 7 6 - 20 mg/dL   Creatinine, Ser 1.61 0.44 - 1.00 mg/dL   Calcium 9.1 8.9 - 09.6 mg/dL   Total Protein 7.4 6.5 - 8.1 g/dL   Albumin 3.8 3.5 - 5.0 g/dL   AST 22 15 - 41 U/L   ALT 21 0 - 44 U/L   Alkaline Phosphatase 51 38 - 126 U/L   Total Bilirubin 0.4 0.3 - 1.2 mg/dL   GFR, Estimated >04 >54 mL/min   Anion gap 8 5 - 15  Lipase, blood  Result Value Ref Range   Lipase 30 11 - 51 U/L  POC urinalysis dipstick  Result Value Ref Range   Color, UA yellow yellow   Clarity, UA clear clear   Glucose, UA negative negative  mg/dL   Bilirubin, UA negative negative   Ketones, POC UA trace (5) (A) negative mg/dL   Spec Grav, UA 0.981 1.914 - 1.025   Blood, UA trace-intact (A) negative   pH, UA 7.0 5.0 - 8.0   Protein Ur, POC negative negative mg/dL   Urobilinogen, UA 1.0 0.2 or 1.0 E.U./dL   Nitrite, UA Negative Negative   Leukocytes, UA Negative Negative  POCT urine pregnancy  Result Value Ref Range   Preg Test, Ur Negative Negative     Allergies  Allergen Reactions   Latex Swelling    Past Medical History:  Diagnosis Date   Arthritis    Asthma    Carpal tunnel syndrome    Morbid obesity (HCC)    Spinal stenosis    Social History   Socioeconomic History   Marital status: Married    Spouse name: Not on file   Number of children: 4   Years of education: Not on file   Highest education level: Some college, no degree  Occupational History    Comment: NA  Tobacco Use   Smoking status: Former    Types: Cigars    Quit date: 12/22/2003    Years since quitting: 18.6   Smokeless tobacco: Never   Tobacco comments:    Not an everyday smoker.   Vaping Use   Vaping Use: Never used  Substance and Sexual Activity   Alcohol use: Yes    Comment: socially    Drug use: No   Sexual activity: Yes    Birth control/protection: Surgical  Other Topics Concern   Not on file  Social History Narrative   Lives with family   Social Determinants of Health   Financial Resource Strain: Not on file  Food Insecurity: No Food Insecurity (01/03/2022)   Hunger Vital Sign    Worried About Running Out of Food in the Last Year: Never true    Ran Out of Food in the Last Year: Never true  Transportation Needs: No Transportation Needs (01/03/2022)   PRAPARE - Administrator, Civil Service (Medical): No    Lack of Transportation (Non-Medical): No  Physical Activity: Not on file  Stress: Not on file  Social Connections: Not on file  Intimate Partner Violence: Not on file   Family History  Problem  Relation Age of Onset   Lung cancer Mother    Diabetes Father    Hypertension  Father    Throat cancer Maternal Renato Gails, MD 08/24/22 [phone]

## 2022-09-07 ENCOUNTER — Telehealth: Payer: Self-pay

## 2022-09-07 NOTE — Telephone Encounter (Signed)
Called and spoke with patient to get her scheduled for Colon/EUS for July/August. Patient requested to come and see MD first. Patient has been scheduled for 11/22/22 at 10:30 am to see Dr Meridee Score.

## 2022-09-26 ENCOUNTER — Ambulatory Visit (HOSPITAL_COMMUNITY)
Admission: EM | Admit: 2022-09-26 | Discharge: 2022-09-26 | Disposition: A | Discharge status: Home / Self Care | Payer: No Typology Code available for payment source

## 2022-10-04 ENCOUNTER — Telehealth (INDEPENDENT_AMBULATORY_CARE_PROVIDER_SITE_OTHER): Payer: Self-pay | Admitting: Primary Care

## 2022-10-05 ENCOUNTER — Encounter (INDEPENDENT_AMBULATORY_CARE_PROVIDER_SITE_OTHER): Payer: No Typology Code available for payment source | Admitting: Primary Care

## 2022-10-09 NOTE — Telephone Encounter (Signed)
Spoke to pt. Will be at apt.  

## 2022-10-10 ENCOUNTER — Ambulatory Visit (INDEPENDENT_AMBULATORY_CARE_PROVIDER_SITE_OTHER): Payer: Self-pay | Admitting: Primary Care

## 2022-10-10 ENCOUNTER — Other Ambulatory Visit (HOSPITAL_COMMUNITY)
Admission: RE | Admit: 2022-10-10 | Discharge: 2022-10-10 | Disposition: A | Discharge status: Home / Self Care | Payer: Self-pay | Source: Ambulatory Visit | Attending: Primary Care | Admitting: Primary Care

## 2022-10-10 VITALS — BP 138/91 | HR 69 | Resp 16 | Ht 61.0 in | Wt 200.6 lb

## 2022-10-10 DIAGNOSIS — Z1159 Encounter for screening for other viral diseases: Secondary | ICD-10-CM

## 2022-10-10 DIAGNOSIS — Z1211 Encounter for screening for malignant neoplasm of colon: Secondary | ICD-10-CM

## 2022-10-10 DIAGNOSIS — Z124 Encounter for screening for malignant neoplasm of cervix: Secondary | ICD-10-CM | POA: Insufficient documentation

## 2022-10-10 DIAGNOSIS — I10 Essential (primary) hypertension: Secondary | ICD-10-CM

## 2022-10-10 DIAGNOSIS — Z114 Encounter for screening for human immunodeficiency virus [HIV]: Secondary | ICD-10-CM

## 2022-10-10 DIAGNOSIS — Z13220 Encounter for screening for lipoid disorders: Secondary | ICD-10-CM

## 2022-10-11 LAB — LIPID PANEL
Chol/HDL Ratio: 3.4 ratio (ref 0.0–4.4)
Cholesterol, Total: 168 mg/dL (ref 100–199)
HDL: 50 mg/dL (ref >39)
LDL Chol Calc (NIH): 96 mg/dL (ref 0–99)
Triglycerides: 122 mg/dL (ref 0–149)
VLDL Cholesterol Cal: 22 mg/dL (ref 5–40)

## 2022-10-11 LAB — CBC WITH DIFFERENTIAL/PLATELET
Basophils Absolute: 0 10*3/uL (ref 0.0–0.2)
Basos: 1 %
EOS (ABSOLUTE): 0.2 10*3/uL (ref 0.0–0.4)
Eos: 4 %
Hematocrit: 35.9 % (ref 34.0–46.6)
Hemoglobin: 10.8 g/dL — ABNORMAL LOW (ref 11.1–15.9)
Immature Grans (Abs): 0 10*3/uL (ref 0.0–0.1)
Immature Granulocytes: 0 %
Lymphocytes Absolute: 3.3 10*3/uL — ABNORMAL HIGH (ref 0.7–3.1)
Lymphs: 55 %
MCH: 24.5 pg — ABNORMAL LOW (ref 26.6–33.0)
MCHC: 30.1 g/dL — ABNORMAL LOW (ref 31.5–35.7)
MCV: 82 fL (ref 79–97)
Monocytes Absolute: 0.4 10*3/uL (ref 0.1–0.9)
Monocytes: 7 %
Neutrophils Absolute: 2 10*3/uL (ref 1.4–7.0)
Neutrophils: 33 %
Platelets: 440 10*3/uL (ref 150–450)
RBC: 4.4 x10E6/uL (ref 3.77–5.28)
RDW: 14.6 % (ref 11.7–15.4)
WBC: 6 10*3/uL (ref 3.4–10.8)

## 2022-10-11 LAB — HCV INTERPRETATION

## 2022-10-11 LAB — HCV AB W REFLEX TO QUANT PCR: HCV Ab: NONREACTIVE

## 2022-10-11 NOTE — Progress Notes (Signed)
[facility]  Michele Trevino is a 47 y.o. female presents to office today for annual physical exam examination.    Concerns today include: 1. HTN , Marital status: M, Substance use: None Diet: None, Exercise: sometimes  Past Medical History:  Diagnosis Date   Arthritis    Asthma    Carpal tunnel syndrome    Morbid obesity (HCC)    Spinal stenosis     Health Maintenance  Topic Date Due   Colonoscopy  05/25/2021   COVID-19 Vaccine (1 - 2023-24 season) Never done   PAP SMEAR-Modifier  03/16/2022   INFLUENZA VACCINE  09/21/2022   DTaP/Tdap/Td (2 - Td or Tdap) 03/26/2028   Hepatitis C Screening  Completed   HIV Screening  Completed   HPV VACCINES  Aged Out     Past Medical History:  Diagnosis Date   Arthritis    Asthma    Carpal tunnel syndrome    Morbid obesity (HCC)    Spinal stenosis    Social History   Socioeconomic History   Marital status: Married    Spouse name: Not on file   Number of children: 4   Years of education: Not on file   Highest education level: Some college, no degree  Occupational History    Comment: NA  Tobacco Use   Smoking status: Former    Types: Cigars    Quit date: 12/22/2003    Years since quitting: 18.8   Smokeless tobacco: Never   Tobacco comments:    Not an everyday smoker.   Vaping Use   Vaping status: Never Used  Substance and Sexual Activity   Alcohol use: Yes    Comment: socially    Drug use: No   Sexual activity: Yes    Birth control/protection: Surgical  Other Topics Concern   Not on file  Social History Narrative   Lives with family   Social Determinants of Health   Financial Resource Strain: Not on file  Food Insecurity: No Food Insecurity (01/03/2022)   Hunger Vital Sign    Worried About Running Out of Food in the Last Year: Never true    Ran Out of Food in the Last Year: Never true  Transportation Needs: No Transportation Needs (01/03/2022)   PRAPARE - Scientist, research (physical sciences) (Medical): No    Lack of Transportation (Non-Medical): No  Physical Activity: Not on file  Stress: Not on file  Social Connections: Not on file  Intimate Partner Violence: Not on file   Past Surgical History:  Procedure Laterality Date   CESAREAN SECTION  [phone]   x 3   TUBAL LIGATION     Family History  Problem Relation Age of Onset   Lung cancer Mother    Diabetes Father    Hypertension Father    Throat cancer Maternal Aunt     Current Outpatient Medications:    albuterol (VENTOLIN HFA) 108 (90 Base) MCG/ACT inhaler, Inhale 1-2 puffs into the lungs every 6 (six) hours as needed for wheezing or shortness of breath., Disp: 8 g, Rfl: 1   cetirizine-pseudoephedrine (ZYRTEC-D ALLERGY & CONGESTION) 5-120 MG tablet, Take 1 tablet by mouth 2 (two) times daily., Disp: 60 tablet, Rfl: 1   albuterol (PROVENTIL) (2.5 MG/3ML) 0.083% nebulizer solution, Take 3 mLs (2.5 mg total) by nebulization every 6 (six) hours as needed for wheezing or shortness of breath., Disp: 75 mL, Rfl: 12   fluticasone (FLONASE) 50 MCG/ACT nasal spray, Place 2 sprays  into both nostrils daily. (Patient not taking: Reported on 10/10/2022), Disp: 16 g, Rfl: 6   hydrochlorothiazide (HYDRODIURIL) 25 MG tablet, Take 1 tablet (25 mg total) by mouth daily. (Patient not taking: Reported on 10/10/2022), Disp: 90 tablet, Rfl: 3   sertraline (ZOLOFT) 50 MG tablet, TAKE 1 TABLET BY MOUTH AT BEDTIME (Patient not taking: Reported on 10/10/2022), Disp: 90 tablet, Rfl: 1 Outpatient Encounter Medications as of 10/10/2022  Medication Sig Note   albuterol (VENTOLIN HFA) 108 (90 Base) MCG/ACT inhaler Inhale 1-2 puffs into the lungs every 6 (six) hours as needed for wheezing or shortness of breath.    cetirizine-pseudoephedrine (ZYRTEC-D ALLERGY & CONGESTION) 5-120 MG tablet Take 1 tablet by mouth 2 (two) times daily. 10/04/2020: PRN   albuterol (PROVENTIL) (2.5 MG/3ML) 0.083% nebulizer solution Take 3 mLs (2.5 mg total) by  nebulization every 6 (six) hours as needed for wheezing or shortness of breath.    fluticasone (FLONASE) 50 MCG/ACT nasal spray Place 2 sprays into both nostrils daily. (Patient not taking: Reported on 10/10/2022) 10/04/2020: PRN   hydrochlorothiazide (HYDRODIURIL) 25 MG tablet Take 1 tablet (25 mg total) by mouth daily. (Patient not taking: Reported on 10/10/2022)    sertraline (ZOLOFT) 50 MG tablet TAKE 1 TABLET BY MOUTH AT BEDTIME (Patient not taking: Reported on 10/10/2022)    No facility-administered encounter medications on file as of 10/10/2022.    Allergies  Allergen Reactions   Latex Swelling     ROS: Review of Systems A comprehensive review of systems was negative.    Physical exam Physical exam: General: Vital signs reviewed.  Patient is well-developed and well-nourished, obese female in no acute distress and cooperative with exam. Head: Normocephalic and atraumatic. Eyes: EOMI, conjunctivae normal, no scleral icterus. Neck: Supple, trachea midline, normal ROM, no JVD, masses, thyromegaly, or carotid bruit present. Cardiovascular: RRR, S1 normal, S2 normal, no murmurs, gallops, or rubs. Pulmonary/Chest: Clear to auscultation bilaterally, no wheezes, rales, or rhonchi. Abdominal: Soft, non-tender, non-distended, BS +, no masses, organomegaly, or guarding present. Musculoskeletal: No joint deformities, erythema, or stiffness, ROM full and nontender. Extremities: No lower extremity edema bilaterally,  pulses symmetric and intact bilaterally. No cyanosis or clubbing. Neurological: A&O x3, Strength is normal Skin: Warm, dry and intact. No rashes or erythema. Psychiatric: Normal mood and affect. speech and behavior is normal. Cognition and memory are normal.      Assessment/ Plan: Michele Trevino here for annual physical exam.  Michele Trevino was seen today for annual exam.  Diagnoses and all orders for this visit:  Essential hypertension -     CBC with  Differential/Platelet -     CMP14+EGFR  Need for hepatitis C screening test -     HCV Ab w Reflex to Quant PCR  Colon cancer screening -     Fecal occult blood, imunochemical -     Ambulatory referral to Gastroenterology  Screening cholesterol level -     Lipid panel  Cervical cancer screening -     Cervicovaginal ancillary only -     Cytology - PAP(So-Hi)  Encounter for screening for HIV -     Cancel: HIV antibody (with reflex) -     HIV antibody (with reflex); Future  Other orders -     Interpretation:     Counseled on healthy lifestyle choices, including diet (rich in fruits, vegetables and lean meats and low in salt and simple carbohydrates) and exercise (at least 30 minutes of moderate physical activity daily).  Patient to  follow up in 1 year for annual exam or sooner if needed.  The above assessment and management plan was discussed with the patient. The patient verbalized understanding of and has agreed to the management plan. Patient is aware to call the clinic if symptoms persist or worsen. Patient is aware when to return to the clinic for a follow-up visit. Patient educated on when it is appropriate to go to the emergency department.   This note has been created with Education officer, environmental. Any transcriptional errors are unintentional.   Grayce Sessions, NP 10/11/2022, 5:25 PM

## 2022-10-12 ENCOUNTER — Other Ambulatory Visit (INDEPENDENT_AMBULATORY_CARE_PROVIDER_SITE_OTHER): Payer: Self-pay | Admitting: Primary Care

## 2022-10-12 DIAGNOSIS — B9689 Other specified bacterial agents as the cause of diseases classified elsewhere: Secondary | ICD-10-CM

## 2022-10-12 LAB — CMP14+EGFR
ALT: 13 IU/L (ref 0–32)
AST: 21 IU/L (ref 0–40)
Albumin: 4.1 g/dL (ref 3.9–4.9)
Alkaline Phosphatase: 64 IU/L (ref 44–121)
BUN/Creatinine Ratio: 7 — ABNORMAL LOW (ref 9–23)
BUN: 6 mg/dL (ref 6–24)
Bilirubin Total: <0.2 mg/dL (ref 0.0–1.2)
CO2: 22 mmol/L (ref 20–29)
Calcium: 9.3 mg/dL (ref 8.7–10.2)
Chloride: 104 mmol/L (ref 96–106)
Creatinine, Ser: 0.85 mg/dL (ref 0.57–1.00)
Globulin, Total: 2.9 g/dL (ref 1.5–4.5)
Glucose: 91 mg/dL (ref 70–99)
Potassium: 4.8 mmol/L (ref 3.5–5.2)
Sodium: 142 mmol/L (ref 134–144)
Total Protein: 7 g/dL (ref 6.0–8.5)
eGFR: 84 mL/min/{1.73_m2} (ref >59)

## 2022-10-12 LAB — CERVICOVAGINAL ANCILLARY ONLY
Bacterial Vaginitis (gardnerella): POSITIVE — AB
Candida Glabrata: NEGATIVE
Candida Vaginitis: NEGATIVE
Chlamydia: NEGATIVE
Comment: NEGATIVE
Comment: NEGATIVE
Comment: NEGATIVE
Comment: NEGATIVE
Comment: NEGATIVE
Comment: NORMAL
Neisseria Gonorrhea: NEGATIVE
Trichomonas: NEGATIVE

## 2022-10-12 LAB — SPECIMEN STATUS REPORT

## 2022-10-12 MED ORDER — METRONIDAZOLE 500 MG PO TABS: 500.0000 mg | ORAL_TABLET | Freq: Two times a day (BID) | ORAL | 0 refills | Status: DC

## 2022-10-18 LAB — CYTOLOGY - PAP
Comment: NEGATIVE
Comment: NEGATIVE
Diagnosis: UNDETERMINED — AB
High risk HPV: NEGATIVE
Trichomonas: NEGATIVE

## 2022-11-22 ENCOUNTER — Encounter: Payer: Self-pay | Admitting: Gastroenterology

## 2022-11-22 ENCOUNTER — Ambulatory Visit (INDEPENDENT_AMBULATORY_CARE_PROVIDER_SITE_OTHER): Payer: Self-pay | Admitting: Gastroenterology

## 2022-11-22 VITALS — BP 120/80 | HR 72 | Ht 60.25 in | Wt 200.4 lb

## 2022-11-22 DIAGNOSIS — R1319 Other dysphagia: Secondary | ICD-10-CM

## 2022-11-22 DIAGNOSIS — R1310 Dysphagia, unspecified: Secondary | ICD-10-CM

## 2022-11-22 DIAGNOSIS — K639 Disease of intestine, unspecified: Secondary | ICD-10-CM

## 2022-11-22 MED ORDER — NA SULFATE-K SULFATE-MG SULF 17.5-3.13-1.6 GM/177ML PO SOLN: 1.0000 | ORAL | 0 refills | Status: DC

## 2022-11-22 NOTE — Progress Notes (Unsigned)
GASTROENTEROLOGY OUTPATIENT CLINIC VISIT   Primary Care Provider Michele Sessions, NP [address] [place] [address] [phone]  Referring Provider Michele Sessions, NP [address] [place],  [place] [address] [phone]  Patient Profile: Michele Trevino is a 47 y.o. female with a pmh significant for  The patient presents to the [facility] for an evaluation and management of problem(s) noted below:  Problem List No diagnosis found.  History of Present Illness    The patient does/does not take NSAIDs or BC/Goody Powder. Patient has/has not had an EGD. Patient has/has not had a Colonoscopy.  GI Review of Systems Positive as above Negative for  Pyrosis; Reflux; Regurgitation; Dysphagia; Odynophagia; Globus; Post-prandial cough; Nocturnal cough; Nasal regurgitation; Epigastric pain; Nausea; Vomiting; Hematemesis; Jaundice; Change in Appetite; Early satiety; Abdominal pain; Abdominal bloating; Eructation; Flatulence; Change in BM Frequency; Change in BM Consistency; Constipation; Diarrhea; Incontinence; Urgency; Tenesmus; Hematochezia; Melena  Review of Systems General: Denies fevers/chills/weight loss/night sweats HEENT: Denies oral lesions/sore throat/headaches/visual changes Cardiovascular: Denies chest pain/palpitations Pulmonary: Denies shortness of breath/cough Gastroenterological: See HPI Genitourinary: Denies darkened urine or hematuria Hematological: Denies easy bruising/bleeding Endocrine: Denies temperature intolerance Dermatological: Denies skin changes Psychological: Mood is stable Allergy & Immunology: Denies severe allergic reactions Musculoskeletal: Denies new arthralgias   Medications Current Outpatient Medications  Medication Sig Dispense Refill   albuterol (PROVENTIL) (2.5 MG/3ML) 0.083% nebulizer solution Take 3 mLs (2.5 mg total) by nebulization every 6 (six) hours as needed for wheezing or  shortness of breath. 75 mL 12   albuterol (VENTOLIN HFA) 108 (90 Base) MCG/ACT inhaler Inhale 1-2 puffs into the lungs every 6 (six) hours as needed for wheezing or shortness of breath. 8 g 1   AMBULATORY NON FORMULARY MEDICATION [place] 1 capsule by mouth daily     AMBULATORY NON FORMULARY MEDICATION URO vaginal probiotic 1 capsule by mouth daily     cetirizine-pseudoephedrine (ZYRTEC-D ALLERGY & CONGESTION) 5-120 MG tablet Take 1 tablet by mouth 2 (two) times daily. 60 tablet 1   Cholecalciferol (VITAMIN D-3 PO) Take 1 capsule by mouth daily.     COLLAGEN PO Take 1 capsule by mouth daily.     Cyanocobalamin (VITAMIN B 12 PO) Take 1 tablet by mouth daily.     ferrous sulfate 325 (65 FE) MG EC tablet Take 325 mg by mouth daily.     fluticasone (FLONASE) 50 MCG/ACT nasal spray Place 2 sprays into both nostrils daily. 16 g 6   Multiple Vitamin (MULTIVITAMIN) capsule Take 1 capsule by mouth daily.     No current facility-administered medications for this visit.    Allergies Allergies  Allergen Reactions   Latex Swelling    Histories Past Medical History:  Diagnosis Date   Arthritis    Asthma    Carpal tunnel syndrome    Morbid obesity (HCC)    Spinal stenosis    Past Surgical History:  Procedure Laterality Date   CESAREAN SECTION  [phone]   x 3   TUBAL LIGATION     Social History   Socioeconomic History   Marital status: Married    Spouse name: Not on file   Number of children: 4   Years of education: Not on file   Highest education level: Some college, no degree  Occupational History    Comment: NA  Tobacco Use   Smoking status: Former    Types: Cigars    Quit date: 12/22/2003    Years since quitting: 18.9  Smokeless tobacco: Never   Tobacco comments:    Not an everyday smoker.   Vaping Use   Vaping status: Never Used  Substance and Sexual Activity   Alcohol use: Yes    Comment: socially    Drug use: No   Sexual activity: Yes    Birth  control/protection: Surgical  Other Topics Concern   Not on file  Social History Narrative   Lives with family   Social Determinants of Health   Financial Resource Strain: Not on file  Food Insecurity: No Food Insecurity (01/03/2022)   Hunger Vital Sign    Worried About Running Out of Food in the Last Year: Never true    Ran Out of Food in the Last Year: Never true  Transportation Needs: No Transportation Needs (01/03/2022)   PRAPARE - Administrator, Civil Service (Medical): No    Lack of Transportation (Non-Medical): No  Physical Activity: Not on file  Stress: Not on file  Social Connections: Not on file  Intimate Partner Violence: Not on file   Family History  Problem Relation Age of Onset   Lung cancer Mother    Diabetes Father    Hypertension Father    Throat cancer Maternal Aunt    I have reviewed her medical, social, and family history in detail and updated the electronic medical record as necessary.    PHYSICAL EXAMINATION  BP 120/80 (BP Location: Left Arm, Patient Position: Sitting, Cuff Size: Normal)   Pulse 72   Ht 5' 0.25" (1.53 m) Comment: height measured without shoes  Wt 200 lb 6 oz (90.9 kg)   BMI 38.81 kg/m  Wt Readings from Last 3 Encounters:  11/22/22 200 lb 6 oz (90.9 kg)  10/10/22 200 lb 9.6 oz (91 kg)  08/23/22 205 lb 0.4 oz (93 kg)   GEN: NAD, appears stated age, doesn't appear chronically ill PSYCH: Cooperative, without pressured speech EYE: Conjunctivae pink, sclerae anicteric ENT: MMM, without oral ulcers, no erythema or exudates noted NECK: Supple CV: RR without R/Gs  RESP: CTAB posteriorly, without wheezing GI: NABS, soft, NT/ND, without rebound or guarding, no HSM appreciated GU: DRE shows MSK/EXT: _ edema, no palmar erythema SKIN: No jaundice, no spider angiomata, no concerning rashes NEURO:  Alert & Oriented x 3, no focal deficits, no evidence of asterixis   REVIEW OF DATA  I reviewed the following data at the time  of this encounter:  GI Procedures and Studies  ***  Laboratory Studies  ***  Imaging Studies  ***   ASSESSMENT  [name] is a 47 y.o. female with a pmh significant for The patient is seen today for evaluation and management of:  No diagnosis found.  ***   PLAN  There are no diagnoses linked to this encounter.   No orders of the defined types were placed in this encounter.   New Prescriptions   No medications on file   Modified Medications   No medications on file    Planned Follow Up No follow-ups on file.   Total Time in Face-to-Face and in Coordination of Care for patient including independent/personal interpretation/review of prior testing, medical history, examination, medication adjustment, communicating results with the patient directly, and documentation within the EHR is ***.   Corliss Parish, MD  Gastroenterology Advanced Endoscopy Office # [phone]

## 2022-11-22 NOTE — Patient Instructions (Signed)
We have sent the following medications to your pharmacy for you to pick up at your convenience: Suprep   You have been scheduled for a colonoscopy. Please follow written instructions given to you at your visit today.   Please pick up your prep supplies at the pharmacy within the next 1-3 days.  If you use inhalers (even only as needed), please bring them with you on the day of your procedure.  DO NOT TAKE 7 DAYS PRIOR TO TEST- Trulicity (dulaglutide) Ozempic, Wegovy (semaglutide) Mounjaro (tirzepatide) Bydureon Bcise (exanatide extended release)  DO NOT TAKE 1 DAY PRIOR TO YOUR TEST Rybelsus (semaglutide) Adlyxin (lixisenatide) Victoza (liraglutide) Byetta (exanatide) ___________________________________________________________________________  Due to recent changes in healthcare laws, you may see the results of your imaging and laboratory studies on MyChart before your provider has had a chance to review them.  We understand that in some cases there may be results that are confusing or concerning to you. Not all laboratory results come back in the same time frame and the provider may be waiting for multiple results in order to interpret others.  Please give [place] 48 hours in order for your provider to thoroughly review all the results before contacting the office for clarification of your results.   _______________________________________________________  If your blood pressure at your visit was 140/90 or greater, please contact your primary care physician to follow up on this.  _______________________________________________________  If you are age 58 or older, your body mass index should be between 23-30. Your Body mass index is 38.81 kg/m. If this is out of the aforementioned range listed, please consider follow up with your Primary Care Provider.  If you are age 60 or younger, your body mass index should be between 19-25. Your Body mass index is 38.81 kg/m. If this is out of the  aformentioned range listed, please consider follow up with your Primary Care Provider.   ________________________________________________________  The [facility] providers would like to encourage you to use [facility] to communicate with providers for non-urgent requests or questions.  Due to long hold times on the telephone, sending your provider a message by [facility] may be a faster and more efficient way to get a response.  Please allow 48 business hours for a response.  Please remember that this is for non-urgent requests.  _______________________________________________________  Thank you for choosing me and [facility].  Dr. Meridee Score

## 2022-11-23 ENCOUNTER — Encounter: Payer: Self-pay | Admitting: Gastroenterology

## 2022-11-23 DIAGNOSIS — R1319 Other dysphagia: Secondary | ICD-10-CM | POA: Insufficient documentation

## 2022-11-23 DIAGNOSIS — K639 Disease of intestine, unspecified: Secondary | ICD-10-CM | POA: Insufficient documentation

## 2022-12-01 ENCOUNTER — Telehealth (INDEPENDENT_AMBULATORY_CARE_PROVIDER_SITE_OTHER): Payer: Self-pay

## 2022-12-01 NOTE — Telephone Encounter (Signed)
Copied from CRM [id number]. Topic: General - Inquiry >> Nov 30, 2022  2:04 PM Haroldine Laws wrote: Reason for CRM: pt was in for her Physical in August and she wants to know if there is something she can take to balance out her hormones .  She is also having hemorrhoids and would like something to help that.  [phone]

## 2022-12-05 ENCOUNTER — Encounter (INDEPENDENT_AMBULATORY_CARE_PROVIDER_SITE_OTHER): Payer: Self-pay | Admitting: Primary Care

## 2022-12-05 DIAGNOSIS — N951 Menopausal and female climacteric states: Secondary | ICD-10-CM

## 2022-12-08 ENCOUNTER — Other Ambulatory Visit: Payer: Self-pay

## 2022-12-08 ENCOUNTER — Telehealth: Payer: Self-pay | Admitting: Gastroenterology

## 2022-12-08 ENCOUNTER — Emergency Department (HOSPITAL_COMMUNITY): Payer: Self-pay

## 2022-12-08 ENCOUNTER — Emergency Department (HOSPITAL_COMMUNITY)
Admission: EM | Admit: 2022-12-08 | Discharge: 2022-12-09 | Discharge status: Against Medical Advice | Payer: Self-pay | Attending: Emergency Medicine | Admitting: Emergency Medicine

## 2022-12-08 DIAGNOSIS — M542 Cervicalgia: Secondary | ICD-10-CM | POA: Insufficient documentation

## 2022-12-08 DIAGNOSIS — Z5321 Procedure and treatment not carried out due to patient leaving prior to being seen by health care provider: Secondary | ICD-10-CM | POA: Insufficient documentation

## 2022-12-08 NOTE — Telephone Encounter (Signed)
Patient called would like to discuss benefits with someone prior to her procedure in Dec so she can prepare.

## 2022-12-08 NOTE — ED Triage Notes (Signed)
Patient reports persistent pain at posterior neck radiating down to mid back onset Monday , denies injury or fall , pain increases with movement /changing positions , denies fever or chills.

## 2022-12-09 NOTE — ED Notes (Signed)
Pt called for room x2 with no answer. Pt not seen in the lobby. Security stated they saw Pt leave almost an hour ago.

## 2023-01-12 NOTE — Telephone Encounter (Signed)
Error

## 2023-01-16 IMAGING — XA Imaging study
2 series · 2 of 2 positions shown · non-contrast
Comparison: none

CLINICAL DATA: Lumbosacral spondylosis without myelopathy. Pain in
the low back and legs with spasms, currently greater on the right.
60% improvement following the epidural injection in [DATE].

[Series 1: ortho adipose · 1 of 1 slices shown (1 of 2)]
[im 1/1]
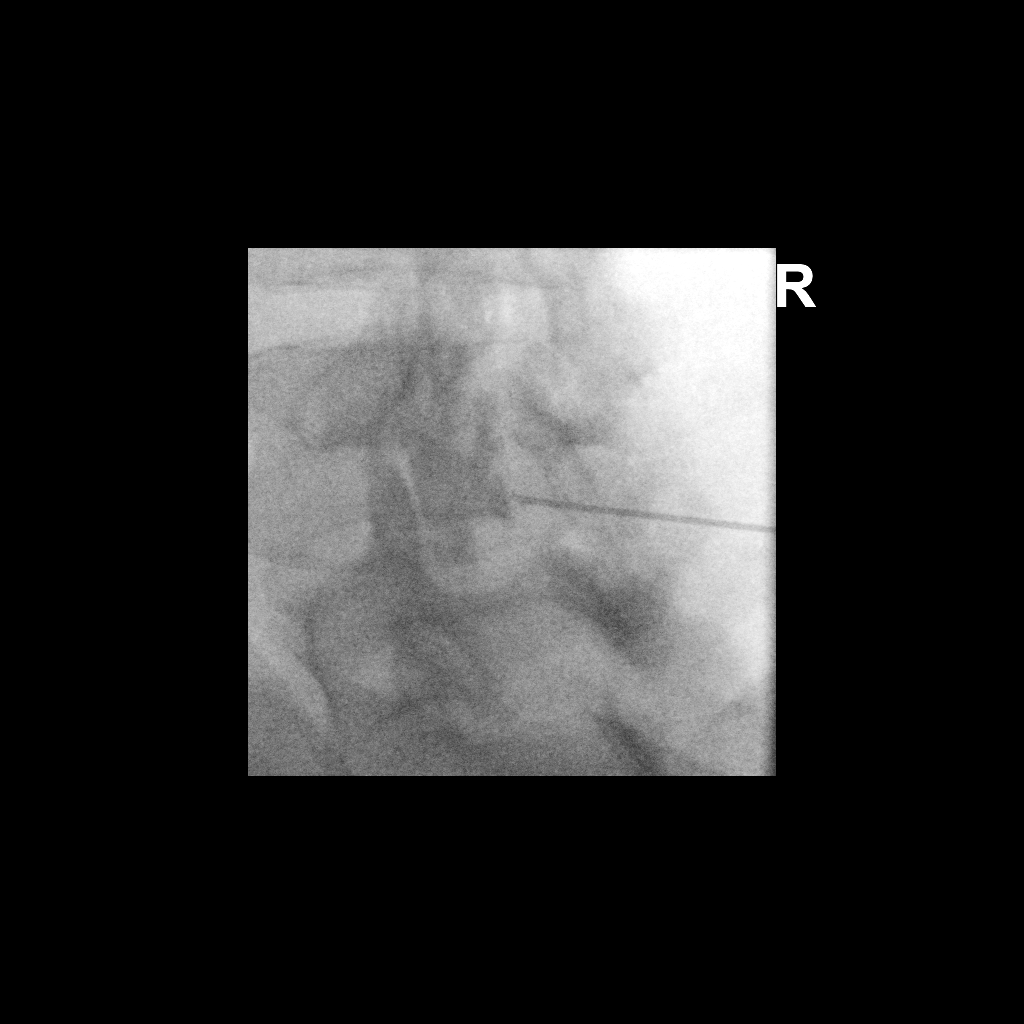

[Series 2: ortho adipose · 1 of 1 slices shown (2 of 2)]
[im 1/1]
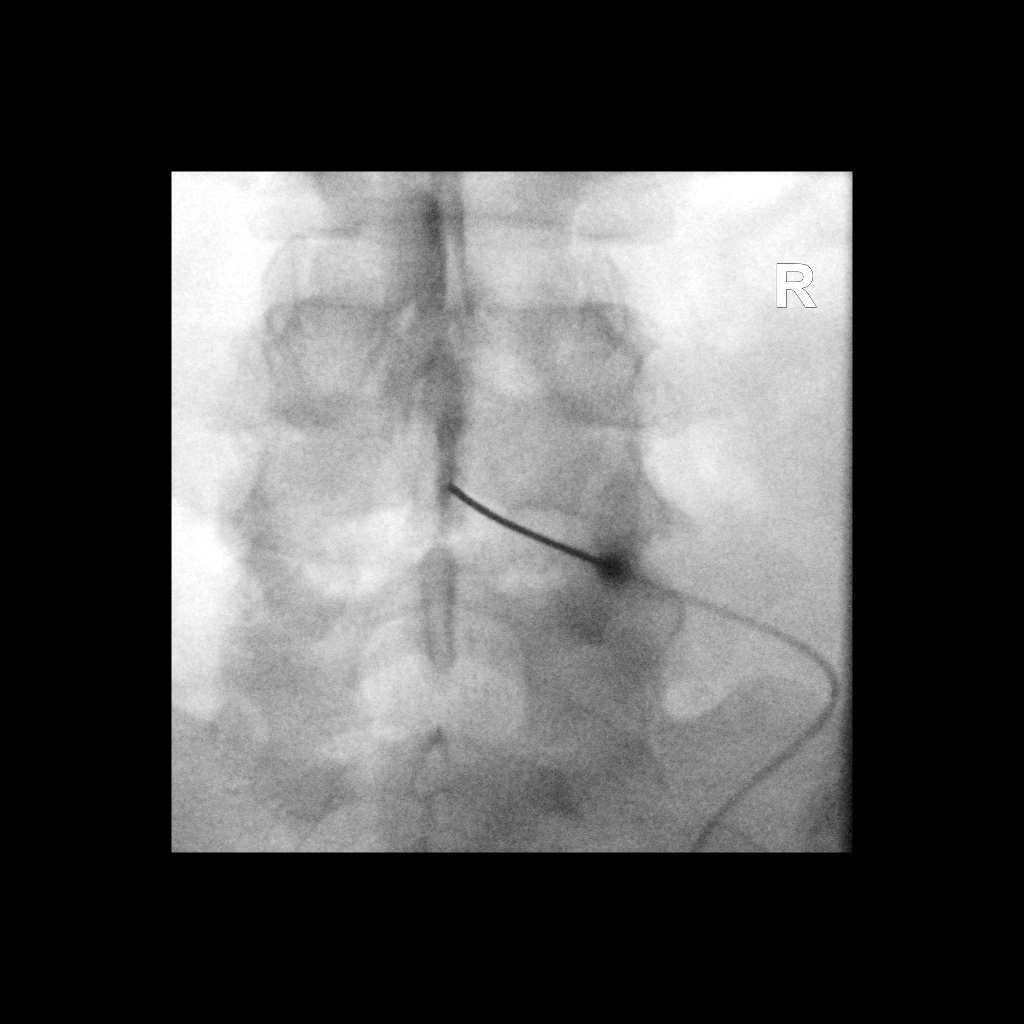

[2 of 2 positions shown; findings below may reference images not displayed]

FLUOROSCOPY TIME:  Fluoroscopy Time: 27 seconds

Radiation Exposure Index: 38.95 microGray*m^2

PROCEDURE:
The procedure, risks, benefits, and alternatives were explained to
the patient. Questions regarding the procedure were encouraged and
answered. The patient understands and consents to the procedure.

LUMBAR EPIDURAL INJECTION:

An interlaminar approach was performed on the right at L4-5. The
overlying skin was cleansed and anesthetized. A 6 inch 20 gauge
epidural needle was advanced using loss-of-resistance technique.

DIAGNOSTIC EPIDURAL INJECTION:

Injection of Isovue-M 200 shows a good epidural pattern with spread
above and below the level of needle placement bilaterally. No
vascular opacification is seen.

THERAPEUTIC EPIDURAL INJECTION:

120 mg of Depo-Medrol mixed with 3 mL of 1% lidocaine were
instilled. The procedure was well-tolerated, and the patient was
discharged thirty minutes following the injection in good condition.

COMPLICATIONS:
None
IMPRESSION: Technically successful interlaminar epidural injection on the right
at L4-5.

## 2023-01-25 ENCOUNTER — Encounter (HOSPITAL_COMMUNITY): Payer: Self-pay | Admitting: Gastroenterology

## 2023-01-25 NOTE — Progress Notes (Signed)
Attempted to obtain medical history for pre op call via telephone, unable to reach at this time. HIPAA compliant voicemail message left requesting return call to pre surgical testing department.

## 2023-01-31 NOTE — Anesthesia Preprocedure Evaluation (Signed)
Anesthesia Evaluation  Patient identified by MRN, date of birth, ID band Patient awake    Reviewed: Allergy & Precautions, H&P , NPO status , Patient's Chart, lab work & pertinent test results  Airway Mallampati: III  TM Distance: >3 FB Neck ROM: Full    Dental no notable dental hx. (+) Teeth Intact, Dental Advisory Given   Pulmonary asthma , former smoker   Pulmonary exam normal breath sounds clear to auscultation       Cardiovascular Exercise Tolerance: Good negative cardio ROS  Rhythm:Regular Rate:Normal     Neuro/Psych   Anxiety     negative neurological ROS     GI/Hepatic Neg liver ROS,GERD  ,,  Endo/Other    Class 3 obesity  Renal/GU negative Renal ROS  negative genitourinary   Musculoskeletal  (+) Arthritis , Osteoarthritis,    Abdominal   Peds  Hematology negative hematology ROS (+)   Anesthesia Other Findings   Reproductive/Obstetrics negative OB ROS                              Anesthesia Physical Anesthesia Plan  ASA: 3  Anesthesia Plan: MAC   Post-op Pain Management: Minimal or no pain anticipated   Induction: Intravenous  PONV Risk Score and Plan: 2 and Propofol infusion and Treatment may vary due to age or medical condition  Airway Management Planned: Natural Airway and Simple Face Mask  Additional Equipment:   Intra-op Plan:   Post-operative Plan:   Informed Consent: I have reviewed the patients History and Physical, chart, labs and discussed the procedure including the risks, benefits and alternatives for the proposed anesthesia with the patient or authorized representative who has indicated his/her understanding and acceptance.     Dental advisory given  Plan Discussed with: CRNA  Anesthesia Plan Comments:         Anesthesia Quick Evaluation

## 2023-02-01 ENCOUNTER — Encounter (HOSPITAL_COMMUNITY)
Admission: RE | Disposition: A | Discharge status: Home / Self Care | Payer: Self-pay | Source: Home / Self Care | Attending: Gastroenterology

## 2023-02-01 ENCOUNTER — Ambulatory Visit (HOSPITAL_BASED_OUTPATIENT_CLINIC_OR_DEPARTMENT_OTHER): Payer: Self-pay | Admitting: Anesthesiology

## 2023-02-01 ENCOUNTER — Encounter (HOSPITAL_COMMUNITY): Payer: Self-pay | Admitting: Gastroenterology

## 2023-02-01 ENCOUNTER — Telehealth: Payer: Self-pay

## 2023-02-01 ENCOUNTER — Other Ambulatory Visit: Payer: Self-pay

## 2023-02-01 ENCOUNTER — Ambulatory Visit (HOSPITAL_COMMUNITY)
Admission: RE | Admit: 2023-02-01 | Discharge: 2023-02-01 | Disposition: A | Discharge status: Home / Self Care | Payer: Self-pay | Attending: Gastroenterology | Admitting: Gastroenterology

## 2023-02-01 ENCOUNTER — Ambulatory Visit (HOSPITAL_COMMUNITY): Payer: Self-pay | Admitting: Anesthesiology

## 2023-02-01 DIAGNOSIS — K642 Third degree hemorrhoids: Secondary | ICD-10-CM

## 2023-02-01 DIAGNOSIS — Z87891 Personal history of nicotine dependence: Secondary | ICD-10-CM | POA: Insufficient documentation

## 2023-02-01 DIAGNOSIS — K6389 Other specified diseases of intestine: Secondary | ICD-10-CM | POA: Insufficient documentation

## 2023-02-01 DIAGNOSIS — R1310 Dysphagia, unspecified: Secondary | ICD-10-CM

## 2023-02-01 DIAGNOSIS — K648 Other hemorrhoids: Secondary | ICD-10-CM

## 2023-02-01 DIAGNOSIS — K644 Residual hemorrhoidal skin tags: Secondary | ICD-10-CM | POA: Insufficient documentation

## 2023-02-01 DIAGNOSIS — E66813 Obesity, class 3: Secondary | ICD-10-CM | POA: Insufficient documentation

## 2023-02-01 DIAGNOSIS — K2289 Other specified disease of esophagus: Secondary | ICD-10-CM | POA: Insufficient documentation

## 2023-02-01 DIAGNOSIS — J45909 Unspecified asthma, uncomplicated: Secondary | ICD-10-CM | POA: Insufficient documentation

## 2023-02-01 DIAGNOSIS — K641 Second degree hemorrhoids: Secondary | ICD-10-CM | POA: Insufficient documentation

## 2023-02-01 DIAGNOSIS — Z6838 Body mass index (BMI) 38.0-38.9, adult: Secondary | ICD-10-CM | POA: Insufficient documentation

## 2023-02-01 DIAGNOSIS — D123 Benign neoplasm of transverse colon: Secondary | ICD-10-CM

## 2023-02-01 DIAGNOSIS — Z860100 Personal history of colon polyps, unspecified: Secondary | ICD-10-CM

## 2023-02-01 DIAGNOSIS — K635 Polyp of colon: Secondary | ICD-10-CM

## 2023-02-01 HISTORY — PX: SAVORY DILATION: SHX5439

## 2023-02-01 HISTORY — PX: EUS: SHX5427

## 2023-02-01 HISTORY — PX: ESOPHAGOGASTRODUODENOSCOPY: SHX5428

## 2023-02-01 HISTORY — PX: COLONOSCOPY WITH PROPOFOL: SHX5780

## 2023-02-01 HISTORY — PX: POLYPECTOMY: SHX5525

## 2023-02-01 SURGERY — COLONOSCOPY WITH PROPOFOL
Anesthesia: Monitor Anesthesia Care

## 2023-02-01 MED ORDER — ONDANSETRON HCL 4 MG/2ML IJ SOLN
INTRAMUSCULAR | Status: DC | PRN
  Administered 2023-02-01: 4 mg via INTRAVENOUS

## 2023-02-01 MED ORDER — GLYCOPYRROLATE 0.2 MG/ML IJ SOLN
INTRAMUSCULAR | Status: DC | PRN
  Administered 2023-02-01: .2 mg via INTRAVENOUS

## 2023-02-01 MED ORDER — PROPOFOL 10 MG/ML IV BOLUS
INTRAVENOUS | Status: DC | PRN
  Administered 2023-02-01: 200 ug/kg/min via INTRAVENOUS

## 2023-02-01 MED ORDER — HYDROCORTISONE ACETATE 25 MG RE SUPP: 25.0000 mg | Freq: Every evening | RECTAL | 1 refills | Status: AC

## 2023-02-01 MED ORDER — SUCRALFATE 1 GM/10ML PO SUSP: 1.0000 g | Freq: Two times a day (BID) | ORAL | 0 refills | Status: DC

## 2023-02-01 MED ORDER — SODIUM CHLORIDE 0.9 % IV SOLN: INTRAVENOUS | Status: DC | PRN

## 2023-02-01 MED ORDER — LIDOCAINE 2% (20 MG/ML) 5 ML SYRINGE
INTRAMUSCULAR | Status: DC | PRN
  Administered 2023-02-01: 40 mg via INTRAVENOUS
  Administered 2023-02-01: 60 mg via INTRAVENOUS

## 2023-02-01 SURGICAL SUPPLY — 21 items

## 2023-02-01 NOTE — Op Note (Signed)
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 Estimated Blood Loss:     Estimated blood loss was minimal. Procedure:                Pre-Anesthesia Assessment:                           - Prior to the procedure, a History and Physical                            was performed, and patient medications and                            allergies were reviewed. The patient's tolerance of                            previous anesthesia was also reviewed. The risks                            and benefits of the procedure and the sedation                            options and risks were discussed with the patient.                            All questions were answered, and informed consent                            was obtained. Prior Anticoagulants: The patient has                            taken no anticoagulant or antiplatelet agents. ASA                            Grade Assessment: III - A patient with severe  systemic disease. After reviewing the risks and                            benefits, the patient was deemed in satisfactory                            condition to undergo the procedure.                           After obtaining informed consent, the endoscope was                            passed under direct vision. Throughout the                             procedure, the patient's blood pressure, pulse, and                            oxygen saturations were monitored continuously. The                            CF-HQ190L ([id number] Olympus colonoscope was                            introduced through the anus and advanced to the 3                            cm into the ileum. The was introduced through the                            anus and advanced to the the cecum, identified by                            its appearance for ultrasound. The lower EUS was                            accomplished without difficulty. The patient                            tolerated the procedure. The quality of the bowel                            preparation was good. Scope In: 9:35:38 AM Scope Out: 9:54:23 AM Scope Withdrawal Time: 0 hours 16 minutes 17 seconds  Total Procedure Duration: 0 hours 18 minutes 45 seconds  Findings:      The digital rectal exam findings include hemorrhoids. Pertinent       negatives include no palpable rectal lesions.      Skin tags were found on perianal exam.      ENDOSCOPIC FINDING: :      The terminal ileum and ileocecal valve appeared normal.      A 2 mm polyp was found in the transverse colon. The polyp was sessile.       The polyp was removed with a cold snare. Resection  and retrieval were       complete.      One 8 mm subepithelial nodule was found in the transverse colon.      Normal mucosa was found in the entire colon otherwise.      Non-bleeding non-thrombosed external and internal hemorrhoids were found       during retroflexion, during perianal exam and during digital exam. The       hemorrhoids were Grade II (internal hemorrhoids that prolapse but reduce       spontaneously).      ENDOSONOGRAPHIC FINDING: :      A round intramural (subepithelial) lesion was found in the transverse       colon. The lesion was hypoechoic. Sonographically, the origin appeared       to be within the muscularis propria (Layer  4). No additional wall layers       were involved. The lesion measured up to 6 mm in thickness. The       endosonographic borders were well-defined. Impression:               COLON impression:                           - Hemorrhoids found on digital rectal exam.                           - Perianal skin tags found on perianal exam.                           - The examined portion of the ileum was normal.                           - One 2 mm polyp in the transverse colon, removed                            with a cold snare. Resected and retrieved.                           - Subepithelial nodule in the transverse colon.                           - Normal mucosa in the entire examined colon.                           - Non-bleeding non-thrombosed external and internal                            hemorrhoids.                           EUS impression:                           - An intramural (subepithelial) lesion was                            visualized endosonographically in the transverse  colon. Sonographically, the origin appeared to be                            within the muscularis propria (Layer 4). Tissue has                            not been obtained. However, the endosonographic                            appearance is consistent with a stromal cell                            (smooth muscle) lesion Nieoplasm, of indeterminate                            biological behavior. Moderate Sedation:      Not Applicable - Patient had care per Anesthesia. Recommendation:           - Proceed to scheduled EGD.                           - Await path results.                           - Will discuss timing of repeat colonoscopy +/- EUS                            with patient. This will be dependent on the                            pathology of the polyp as well. Will discuss case                            at multidisciplinary conference as well, but with                             the lesion not changing in size over a few years, I                            am considering repeat colonoscopy with EUS in 3 to                            5 years. Will update patient if that changes after                            discussion with [facility].                           - The findings and recommendations were discussed                            with the patient.                           -  The findings and recommendations were discussed                            with the patient's family. Procedure Code(s):        --- Professional ---                           [phone], Colonoscopy, flexible; with endoscopic                            ultrasound examination limited to the rectum,                            sigmoid, descending, transverse, or ascending colon                            and cecum, and adjacent structures                           [id number], Colonoscopy, flexible; with removal of                            tumor(s), polyp(s), or other lesion(s) by snare                            technique Diagnosis Code(s):        --- Professional ---                           K64.1, Second degree hemorrhoids                           D12.3, Benign neoplasm of transverse colon (hepatic                            flexure or splenic flexure)                           K63.89, Other specified diseases of intestine                           K64.4, Residual hemorrhoidal skin tags CPT copyright 2022 American Medical Association. All rights reserved. The codes documented in this report are preliminary and upon coder review may  be revised to meet current compliance requirements. Corliss Parish, MD 02/01/2023 10:10:46 AM Number of Addenda: 0

## 2023-02-01 NOTE — H&P (Addendum)
GASTROENTEROLOGY PROCEDURE H&P NOTE   Primary Care Physician: Grayce Sessions, NP  HPI: Michele Trevino is a 47 y.o. female who presents for Colonoscopy with EUS for SEL of the lesion and EGD for dysphagia.  Past Medical History:  Diagnosis Date   Arthritis    Asthma    Carpal tunnel syndrome    Morbid obesity (HCC)    Spinal stenosis    Past Surgical History:  Procedure Laterality Date   CESAREAN SECTION  [phone]   x 3   TUBAL LIGATION     No current facility-administered medications for this encounter.   No current facility-administered medications for this encounter. Allergies  Allergen Reactions   Latex Swelling   Family History  Problem Relation Age of Onset   Lung cancer Mother    Diabetes Father    Hypertension Father    Throat cancer Maternal Aunt    Colon cancer Neg Hx    Esophageal cancer Neg Hx    Inflammatory bowel disease Neg Hx    Liver disease Neg Hx    Pancreatic cancer Neg Hx    Rectal cancer Neg Hx    Stomach cancer Neg Hx    Social History   Socioeconomic History   Marital status: Married    Spouse name: Not on file   Number of children: 4   Years of education: Not on file   Highest education level: Some college, no degree  Occupational History    Comment: NA  Tobacco Use   Smoking status: Former    Types: Cigars    Quit date: 12/22/2003    Years since quitting: 19.1   Smokeless tobacco: Never   Tobacco comments:    Not an everyday smoker.   Vaping Use   Vaping status: Never Used  Substance and Sexual Activity   Alcohol use: Yes    Comment: socially    Drug use: No   Sexual activity: Yes    Birth control/protection: Surgical  Other Topics Concern   Not on file  Social History Narrative   Lives with family   Social Drivers of Health   Financial Resource Strain: Not on file  Food Insecurity: No Food Insecurity (01/03/2022)   Hunger Vital Sign    Worried About Running Out of Food in the Last Year:  Never true    Ran Out of Food in the Last Year: Never true  Transportation Needs: No Transportation Needs (01/03/2022)   PRAPARE - Administrator, Civil Service (Medical): No    Lack of Transportation (Non-Medical): No  Physical Activity: Not on file  Stress: Not on file  Social Connections: Not on file  Intimate Partner Violence: Not on file    Physical Exam: Today's Vitals   01/29/23 1119  Weight: 91 kg   Body mass index is 38.86 kg/m. GEN: NAD EYE: Sclerae anicteric ENT: MMM CV: Non-tachycardic GI: Soft, NT/ND NEURO:  Alert & Oriented x 3  Lab Results: No results for input(s): "WBC", "HGB", "HCT", "PLT" in the last 72 hours. BMET No results for input(s): "NA", "K", "CL", "CO2", "GLUCOSE", "BUN", "CREATININE", "CALCIUM" in the last 72 hours. LFT No results for input(s): "PROT", "ALBUMIN", "AST", "ALT", "ALKPHOS", "BILITOT", "BILIDIR", "IBILI" in the last 72 hours. PT/INR No results for input(s): "LABPROT", "INR" in the last 72 hours.   Impression / Plan: This is a 47 y.o.female  who presents for Colonoscopy with EUS for SEL of the lesion and EGD for dysphagia.  The  risks of an EUS including intestinal perforation, bleeding, infection, aspiration, and medication effects were discussed as was the possibility it may not give a definitive diagnosis if a biopsy is performed.  When a biopsy of the pancreas is done as part of the EUS, there is an additional risk of pancreatitis at the rate of about 1-2%.  It was explained that procedure related pancreatitis is typically mild, although it can be severe and even life threatening, which is why we do not perform random pancreatic biopsies and only biopsy a lesion/area we feel is concerning enough to warrant the risk.   The risks and benefits of endoscopic evaluation/treatment were discussed with the patient and/or family; these include but are not limited to the risk of perforation, infection, bleeding, missed lesions,  lack of diagnosis, severe illness requiring hospitalization, as well as anesthesia and sedation related illnesses.  The patient's history has been reviewed, patient examined, no change in status, and deemed stable for procedure.  The patient and/or family is agreeable to proceed.    Corliss Parish, MD [facility] Office # [phone]

## 2023-02-01 NOTE — Anesthesia Postprocedure Evaluation (Signed)
Anesthesia Post Note  Patient: Michele Trevino  Procedure(s) Performed: COLONOSCOPY WITH PROPOFOL LOWER ENDOSCOPIC ULTRASOUND (EUS) ESOPHAGOGASTRODUODENOSCOPY (EGD) POLYPECTOMY SAVORY DILATION     Patient location during evaluation: Endoscopy Anesthesia Type: MAC Level of consciousness: awake and alert Pain management: pain level controlled Vital Signs Assessment: post-procedure vital signs reviewed and stable Respiratory status: spontaneous breathing, nonlabored ventilation and respiratory function stable Cardiovascular status: stable and blood pressure returned to baseline Postop Assessment: no apparent nausea or vomiting Anesthetic complications: no  No notable events documented.  Last Vitals:  Vitals:   02/01/23 1041 02/01/23 1050  BP: 137/85 (!) 140/94  Pulse: 81 70  Resp: 18 15  Temp:    SpO2: 100% 100%    Last Pain:  Vitals:   02/01/23 1050  TempSrc:   PainSc: 0-No pain                 [place] Attar,W. EDMOND

## 2023-02-01 NOTE — Telephone Encounter (Signed)
-----   Message from [facility] sent at 02/01/2023 10:44 AM EST ----- Regarding: Follow-up [name], Place patient in for clinic recall with APP or myself in 3 to 4 months for evaluation of dysphagia post dilation. Thanks. GM

## 2023-02-01 NOTE — Transfer of Care (Signed)
Immediate Anesthesia Transfer of Care Note  Patient: Michele Trevino  Procedure(s) Performed: COLONOSCOPY WITH PROPOFOL LOWER ENDOSCOPIC ULTRASOUND (EUS) ESOPHAGOGASTRODUODENOSCOPY (EGD) POLYPECTOMY SAVORY DILATION  Patient Location: Endoscopy Unit  Anesthesia Type:MAC  Level of Consciousness: sedated  Airway & Oxygen Therapy: Patient Spontanous Breathing and Patient connected to face mask oxygen  Post-op Assessment: Report given to RN and Post -op Vital signs reviewed and stable  Post vital signs: Reviewed  Last Vitals:  Vitals Value Taken Time  BP    Temp    Pulse    Resp    SpO2      Last Pain:  Vitals:   02/01/23 0829  TempSrc: Temporal  PainSc: 0-No pain         Complications: No notable events documented.

## 2023-02-01 NOTE — Op Note (Signed)
[facility] Patient Name: Michele Trevino Procedure Date: 02/01/2023 MRN: [id number] Attending MD: Corliss Parish , MD, [id number] Date of Birth: 1975-11-23 CSN: [id number] Age: 47 Admit Type: Outpatient Procedure:                Upper GI endoscopy Indications:              Dysphagia (improvement a few years ago after 18 mm                            savory dilation) Providers:                Corliss Parish, MD, Norman Clay, RN, Priscella Mann, Technician Referring MD:              Medicines:                Monitored Anesthesia Care Complications:            No immediate complications. Estimated Blood Loss:     Estimated blood loss was minimal. Procedure:                Pre-Anesthesia Assessment:                           - Prior to the procedure, a History and Physical                            was performed, and patient medications and                            allergies were reviewed. The patient's tolerance of                            previous anesthesia was also reviewed. The risks                            and benefits of the procedure and the sedation                            options and risks were discussed with the patient.                            All questions were answered, and informed consent                            was obtained. Prior Anticoagulants: The patient has                            taken no anticoagulant or antiplatelet agents. ASA                            Grade Assessment: III - A patient with severe  systemic disease. After reviewing the risks and                            benefits, the patient was deemed in satisfactory                            condition to undergo the procedure.                           After obtaining informed consent, the endoscope was                            passed under direct vision. Throughout the                             procedure, the patient's blood pressure, pulse, and                            oxygen saturations were monitored continuously. The                            [id number]-[id number] ([id number]) Olympus endoscope was introduced                            through the mouth, and advanced to the second part                            of duodenum. The upper GI endoscopy was                            accomplished without difficulty. The patient                            tolerated the procedure. Scope In: Scope Out: Findings:      No gross lesions were noted in the entire esophagus. A guidewire was       placed and the scope was withdrawn. Dilation was performed with a Savary       dilator with mild resistance at 18 mm. The dilation site was examined       following endoscope reinsertion and showed just below the UES, a mild       mucosal disruption, mild improvement in luminal narrowing and no       perforation.      The Z-line was irregular and was found 37 cm from the incisors.      No gross lesions were noted in the entire examined stomach.      No gross lesions were noted in the duodenal bulb, in the first portion       of the duodenum and in the second portion of the duodenum. Impression:               - No gross lesions in the entire esophagus. Dilated                            to 18 mm savory with mucosal wrent just below the  UES.                           - Z-line irregular, 37 cm from the incisors.                           - No gross lesions in the entire stomach.                           - No gross lesions in the duodenal bulb, in the                            first portion of the duodenum and in the second                            portion of the duodenum. Moderate Sedation:      Not Applicable - Patient had care per Anesthesia. Recommendation:           - The patient will be observed post-procedure,                            until all discharge criteria are  met.                           - Discharge patient to home.                           - Patient has a contact number available for                            emergencies. The signs and symptoms of potential                            delayed complications were discussed with the                            patient. Return to normal activities tomorrow.                            Written discharge instructions were provided to the                            patient.                           - Please use Cepacol or Halls Lozenges +/-                            Chloraseptic spray for next 72-96 hours to aid in                            sore thoat should you experience this.                           - Dilation diet as per  protocol.                           - Carafate 1 g liquid slurry twice daily x 2 weeks.                           - As patient is not having GERD symptoms, we will                            hold on PPI therapy for now.                           - Continue present medications.                           - Repeat upper endoscopy PRN for retreatment, can                            consider dilating up to 19 mm or 20 mm if she has                            improvement.                           - If patient does not have improvement in her                            symptoms, consider manometry evaluation.                           - The findings and recommendations were discussed                            with the patient.                           - The findings and recommendations were discussed                            with the designated responsible adult. Procedure Code(s):        --- Professional ---                           [phone], Esophagogastroduodenoscopy, flexible,                            transoral; with insertion of guide wire followed by                            passage of dilator(s) through esophagus over guide                             wire Diagnosis Code(s):        --- Professional ---  K22.89, Other specified disease of esophagus                           R13.10, Dysphagia, unspecified CPT copyright 2022 American Medical Association. All rights reserved. The codes documented in this report are preliminary and upon coder review may  be revised to meet current compliance requirements. Corliss Parish, MD 02/01/2023 10:14:06 AM Number of Addenda: 0

## 2023-02-01 NOTE — Telephone Encounter (Signed)
Recall has been entered as ordered  

## 2023-02-01 NOTE — Discharge Instructions (Addendum)

## 2023-02-01 NOTE — Anesthesia Procedure Notes (Signed)
Procedure Name: MAC Date/Time: 02/01/2023 9:30 AM  Performed by: Lovie Chol, CRNAPre-anesthesia Checklist: Patient identified, Emergency Drugs available, Suction available and Patient being monitored Oxygen Delivery Method: Simple face mask

## 2023-02-02 LAB — SURGICAL PATHOLOGY

## 2023-02-03 ENCOUNTER — Encounter: Payer: Self-pay | Admitting: Gastroenterology

## 2023-02-05 ENCOUNTER — Encounter (HOSPITAL_COMMUNITY): Payer: Self-pay | Admitting: Gastroenterology

## 2023-02-28 ENCOUNTER — Encounter: Payer: Self-pay | Admitting: *Deleted

## 2023-02-28 ENCOUNTER — Other Ambulatory Visit: Payer: Self-pay | Admitting: *Deleted

## 2023-02-28 ENCOUNTER — Encounter: Payer: Self-pay | Admitting: Gastroenterology

## 2023-02-28 NOTE — Progress Notes (Signed)
 Erroneous encounter

## 2023-02-28 NOTE — Progress Notes (Signed)
The pt has been advised of the recommendations

## 2023-02-28 NOTE — Progress Notes (Signed)
 Case discussed at [facility]. Reviewed with colorectal surgery and radiology. Later for transverse colon subepithelial lesion either a leiomyoma or just but it is subcentimeter. At this point in time plan would remain to do monitoring. Plan for 3-year colon with EUS. If that is stable then consider 5-year colon with EUS unless she is found to have other issues or polyps that may require follow-up thereafter or sooner.  Will let the patient know that her plan as had been discussed previously will remain the same.  Aloha Finner, MD [facility] Office # [phone]

## 2023-02-28 NOTE — Progress Notes (Signed)
Recall has been entered  

## 2023-02-28 NOTE — Progress Notes (Signed)
 The proposed treatment discussed in conference is for discussion purpose only and is not a binding recommendation.  The patients have not been physically examined, or presented with their treatment options.  Therefore, final treatment plans cannot be decided.

## 2023-03-07 NOTE — Progress Notes (Signed)
GYNECOLOGY  VISIT   HPI: Michele Trevino is a 48 y.o.   Married female   [phone] here for perimenopausal symptoms.     Patient reports no period for last 6 months. Hot flashes 7-10 daily, night sweats, mood swings. Her weight is steady, hair thinning starting two years ago. Has used KY Jelly and coconut oil for vaginal dryness with no relief. She has had dyspareunia for as along as patient can remember, previously referred to pelvic floor physical therapy but was uncomfortable with it; she's willing to give it another try. Would like to know if she has BV, as she has them recurrently; having vaginal itching, does not desire STI testing.   She denies history of heart disease, stroke, DVT/PE, liver or gallbladder disease family history of breast cancer, or smoking.  GYNECOLOGIC HISTORY: No LMP recorded. (Menstrual status: Other). Contraception: tubal ligation.   Menopausal hormone therapy:  none Last mammogram: 12/24/21, no abnormalities Pap hx:  10/10/22- ASC-US, HPV neg 03/17/19- NILM, HPV neg 06/28/17- NILM, HPV+ (non-16,18,45)    OB History     Gravida  8   Para  4   Term      Preterm      AB  4   Living  4      SAB      IAB      Ectopic      Multiple      Live Births  4              Patient Active Problem List   Diagnosis Date Noted   History of colonic polyps 02/01/2023   Nodule of colon 11/23/2022   Esophageal dysphagia 11/23/2022   GERD (gastroesophageal reflux disease) 07/28/2019   History of COVID-19 07/02/2019   Anxiety 07/02/2019   Bronchiolitis 07/02/2019   Acute non-recurrent maxillary sinusitis 07/02/2019   Morbid obesity (HCC)    Mixed incontinence 03/21/2019   UTERINE FIBROID 04/19/2006    Past Medical History:  Diagnosis Date   Arthritis    Asthma    Carpal tunnel syndrome    Morbid obesity (HCC)    Spinal stenosis     Past Surgical History:  Procedure Laterality Date   CESAREAN SECTION  [phone]   x 3    COLONOSCOPY WITH PROPOFOL N/A 02/01/2023   Procedure: COLONOSCOPY WITH PROPOFOL;  Surgeon: Lemar Lofty., MD;  Location: [facility];  Service: Gastroenterology;  Laterality: N/A;   ESOPHAGOGASTRODUODENOSCOPY N/A 02/01/2023   Procedure: ESOPHAGOGASTRODUODENOSCOPY (EGD);  Surgeon: Lemar Lofty., MD;  Location: [facility];  Service: Gastroenterology;  Laterality: N/A;   EUS N/A 02/01/2023   Procedure: LOWER ENDOSCOPIC ULTRASOUND (EUS);  Surgeon: Lemar Lofty., MD;  Location: [facility];  Service: Gastroenterology;  Laterality: N/A;   POLYPECTOMY  02/01/2023   Procedure: POLYPECTOMY;  Surgeon: Meridee Score Netty Starring., MD;  Location: [facility];  Service: Gastroenterology;;   Gaspar Bidding DILATION N/A 02/01/2023   Procedure: Jacklyn Shell;  Surgeon: Lemar Lofty., MD;  Location: [facility];  Service: Gastroenterology;  Laterality: N/A;   TUBAL LIGATION      Current Outpatient Medications  Medication Sig Dispense Refill   albuterol (PROVENTIL) (2.5 MG/3ML) 0.083% nebulizer solution Take 3 mLs (2.5 mg total) by nebulization every 6 (six) hours as needed for wheezing or shortness of breath. 75 mL 12   albuterol (VENTOLIN HFA) 108 (90 Base) MCG/ACT inhaler Inhale 1-2 puffs into the lungs every 6 (six) hours as needed for wheezing or shortness of breath. 8 g  1   AMBULATORY NON FORMULARY MEDICATION [employer] 1 capsule by mouth daily     AMBULATORY NON FORMULARY MEDICATION URO vaginal probiotic 1 capsule by mouth daily     cetirizine-pseudoephedrine (ZYRTEC-D ALLERGY & CONGESTION) 5-120 MG tablet Take 1 tablet by mouth 2 (two) times daily. 60 tablet 1   Cholecalciferol (VITAMIN D-3 PO) Take 1 capsule by mouth daily.     COLLAGEN PO Take 1 capsule by mouth daily.     Cyanocobalamin (VITAMIN B 12 PO) Take 1 tablet by mouth daily.     ferrous sulfate 325 (65 FE) MG EC tablet Take 325 mg by mouth daily.     fluticasone (FLONASE) 50 MCG/ACT nasal spray Place 2  sprays into both nostrils daily. 16 g 6   hydrocortisone (ANUSOL-HC) 25 MG suppository Place 1 suppository (25 mg total) rectally at bedtime. Nightly for 4 nights in a row use as needed 12 suppository 1   Multiple Vitamin (MULTIVITAMIN) capsule Take 1 capsule by mouth daily.     sucralfate (CARAFATE) 1 GM/10ML suspension Take 10 mLs (1 g total) by mouth 2 (two) times daily. 420 mL 0   No current facility-administered medications for this visit.     ALLERGIES: Latex  Family History  Problem Relation Age of Onset   Lung cancer Mother    Diabetes Father    Hypertension Father    Throat cancer Maternal Aunt    Colon cancer Neg Hx    Esophageal cancer Neg Hx    Inflammatory bowel disease Neg Hx    Liver disease Neg Hx    Pancreatic cancer Neg Hx    Rectal cancer Neg Hx    Stomach cancer Neg Hx     Social History   Socioeconomic History   Marital status: Married    Spouse name: Not on file   Number of children: 4   Years of education: Not on file   Highest education level: Some college, no degree  Occupational History    Comment: NA  Tobacco Use   Smoking status: Former    Types: Cigars    Quit date: 12/22/2003    Years since quitting: 19.2   Smokeless tobacco: Never   Tobacco comments:    Not an everyday smoker.   Vaping Use   Vaping status: Never Used  Substance and Sexual Activity   Alcohol use: Yes    Comment: socially    Drug use: No   Sexual activity: Yes    Birth control/protection: Surgical  Other Topics Concern   Not on file  Social History Narrative   Lives with family   Social Drivers of Health   Financial Resource Strain: Not on file  Food Insecurity: No Food Insecurity (01/03/2022)   Hunger Vital Sign    Worried About Running Out of Food in the Last Year: Never true    Ran Out of Food in the Last Year: Never true  Transportation Needs: No Transportation Needs (01/03/2022)   PRAPARE - Administrator, Civil Service (Medical): No    Lack  of Transportation (Non-Medical): No  Physical Activity: Not on file  Stress: Not on file  Social Connections: Not on file  Intimate Partner Violence: Not on file    Review of Systems  PHYSICAL EXAMINATION:    There were no vitals taken for this visit.    General appearance: alert, cooperative and appears stated age Head: Normocephalic, without obvious abnormality, atraumatic Neck: no adenopathy, supple, symmetrical, trachea midline  and thyroid normal to inspection and palpation Lungs: clear to auscultation bilaterally Breasts: normal appearance, no masses or tenderness, No nipple retraction or dimpling, No nipple discharge or bleeding, No axillary or supraclavicular adenopathy Heart: regular rate and rhythm Abdomen: soft, non-tender, no masses,  no organomegaly Extremities: extremities normal, atraumatic, no cyanosis or edema Skin: Skin color, texture, turgor normal. No rashes or lesions No abnormal inguinal nodes palpated Neurologic: Grossly normal  Chaperone was present for exam  ASSESSMENT & PLAN   1. Perimenopausal (Primary)  - We discussed the treatment options for menopause as well as indications - we discussed both HRT and non-HRT.  - Discussed the benefits of each and relative effectiveness.  - Discussed goals of therapy i.e. reduction of hot flashes (not complete resolution). Reviewed full response takes up to 2-3 months, including for hormone therapy. - Discussed if HRT we do shortest amount of time at lowest dose.  - We discussed the differences in modes of therapy for HRT -- patch vs oral therapy.  - Discussed risks of HRT: Estrogen+ Progesterone = breast cancer, clotting, MI/Stroke. Discussed risk of estrogen alone.  - Patient Contraindications for oral and transdermal hormone therapy include unexplained vaginal bleeding; liver disease; prior estrogensensitive cancer (including breast cancer); prior coronary heart disease (CHD), stroke, MI, or VTE; or personal history  or inherited high risk of thromboembolic disease  - We discussed non-hormonal options with first line being SSRIs/SNRIs - Patient would like to take time to think about HRT vs non-hormonal options for perimenopausal sxs.  - Hemoglobin A1C today - Advised to f/u with PCP re: elevated BP  2. Cervical cancer screening Due 09/2023 d/t previous abnormal result  3. Vaginal itching Cervicovaginal ancillary only  4. Breast cancer screening by mammogram Overdue (since 2024) - MM 3D SCREENING MAMMOGRAM BILATERAL BREAST; Future  An After Visit Summary was printed and given to the patient.  5. Dyspareunia in female Patient with long term dyspareunia; initially uncomfortable with PFPT, but willing to try again after detailed discussion of indication and benefits.  - Ambulatory referral to Physical Therapy  - F/u 3 months  Ralene Muskrat, [place] 1/15/20257:05 PM

## 2023-03-08 ENCOUNTER — Encounter: Payer: Self-pay | Admitting: Obstetrics and Gynecology

## 2023-03-08 ENCOUNTER — Other Ambulatory Visit (HOSPITAL_COMMUNITY)
Admission: RE | Admit: 2023-03-08 | Discharge: 2023-03-08 | Disposition: A | Discharge status: Home / Self Care | Payer: Self-pay | Source: Ambulatory Visit | Attending: Obstetrics and Gynecology | Admitting: Obstetrics and Gynecology

## 2023-03-08 ENCOUNTER — Other Ambulatory Visit: Payer: Self-pay

## 2023-03-08 ENCOUNTER — Ambulatory Visit: Payer: Self-pay | Admitting: Obstetrics and Gynecology

## 2023-03-08 VITALS — BP 139/92 | HR 73 | Wt 205.0 lb

## 2023-03-08 DIAGNOSIS — N951 Menopausal and female climacteric states: Secondary | ICD-10-CM

## 2023-03-08 DIAGNOSIS — Z124 Encounter for screening for malignant neoplasm of cervix: Secondary | ICD-10-CM

## 2023-03-08 DIAGNOSIS — Z1231 Encounter for screening mammogram for malignant neoplasm of breast: Secondary | ICD-10-CM

## 2023-03-08 DIAGNOSIS — N941 Unspecified dyspareunia: Secondary | ICD-10-CM

## 2023-03-08 DIAGNOSIS — N898 Other specified noninflammatory disorders of vagina: Secondary | ICD-10-CM

## 2023-03-08 LAB — HEMOGLOBIN A1C
Est. average glucose Bld gHb Est-mCnc: 117 mg/dL
Hgb A1c MFr Bld: 5.7 % — ABNORMAL HIGH (ref 4.8–5.6)

## 2023-03-09 LAB — CERVICOVAGINAL ANCILLARY ONLY
Bacterial Vaginitis (gardnerella): NEGATIVE
Candida Glabrata: NEGATIVE
Candida Vaginitis: NEGATIVE
Comment: NEGATIVE
Comment: NEGATIVE
Comment: NEGATIVE

## 2023-03-20 ENCOUNTER — Encounter: Payer: Self-pay | Admitting: Gastroenterology

## 2023-07-02 ENCOUNTER — Telehealth: Payer: Self-pay

## 2023-07-02 NOTE — Telephone Encounter (Signed)
 Patient telephoned [facility] and completed screening process. Patient exceeds federal poverty guidelines. Advised patient to contact [facility] for self pay payment options.

## 2023-07-02 NOTE — Telephone Encounter (Addendum)
 Telephoned patient at mobile number. Left a voice message with [facility] contact information.   Patient telephoned and started [facility] screening process and will call back with spouse's income.

## 2023-08-03 ENCOUNTER — Encounter (HOSPITAL_COMMUNITY): Payer: Self-pay

## 2023-08-03 ENCOUNTER — Ambulatory Visit (HOSPITAL_COMMUNITY)
Admission: EM | Admit: 2023-08-03 | Discharge: 2023-08-03 | Disposition: A | Discharge status: Home / Self Care | Payer: Self-pay | Attending: Emergency Medicine | Admitting: Emergency Medicine

## 2023-08-03 DIAGNOSIS — N390 Urinary tract infection, site not specified: Secondary | ICD-10-CM

## 2023-08-03 LAB — POCT URINALYSIS DIP (MANUAL ENTRY)
Bilirubin, UA: NEGATIVE
Glucose, UA: NEGATIVE mg/dL
Ketones, POC UA: NEGATIVE mg/dL
Nitrite, UA: POSITIVE — AB
Spec Grav, UA: 1.02 (ref 1.010–1.025)
Urobilinogen, UA: 0.2 U/dL
pH, UA: 7 (ref 5.0–8.0)

## 2023-08-03 MED ORDER — NITROFURANTOIN MONOHYD MACRO 100 MG PO CAPS: 100.0000 mg | ORAL_CAPSULE | Freq: Two times a day (BID) | ORAL | 0 refills | Status: DC

## 2023-08-03 MED ORDER — FLUCONAZOLE 150 MG PO TABS: 150.0000 mg | ORAL_TABLET | Freq: Every day | ORAL | 0 refills | Status: AC

## 2023-08-03 NOTE — ED Triage Notes (Signed)
 Pt c/o pain on urination, abdominal pain, and diarrhea since yesterday. Denies taking any meds.

## 2023-08-03 NOTE — ED Provider Notes (Signed)
 [facility]    CSN: [id number] Arrival date & time: 08/03/23  1146      History   Chief Complaint Chief Complaint  Patient presents with   Urinary Tract Infection    HPI Michele Trevino is a 48 y.o. female.   48 year old female who presents urgent care with complaints of dysuria, abdominal pain, diarrhea, hematuria, urgency and frequency of urination.  This started yesterday.  She has had so much urgency that she actually has urinated on herself.  She is trying to stay hydrated but is concerned about drinking too much because of the frequency of urination.  She denies any fevers or chills.  She has some mild nausea but no vomiting.  She has not taken any medication for this.  She does have a history of having urinary tract infections in the past.   Urinary Tract Infection Associated symptoms: no abdominal pain, no fever and no vomiting     Past Medical History:  Diagnosis Date   Arthritis    Asthma    Carpal tunnel syndrome    Morbid obesity (HCC)    Spinal stenosis     Patient Active Problem List   Diagnosis Date Noted   History of colonic polyps 02/01/2023   Nodule of colon 11/23/2022   Esophageal dysphagia 11/23/2022   GERD (gastroesophageal reflux disease) 07/28/2019   History of COVID-19 07/02/2019   Anxiety 07/02/2019   Bronchiolitis 07/02/2019   Acute non-recurrent maxillary sinusitis 07/02/2019   Morbid obesity (HCC)    Mixed incontinence 03/21/2019   UTERINE FIBROID 04/19/2006    Past Surgical History:  Procedure Laterality Date   CESAREAN SECTION  [phone]   x 3   COLONOSCOPY WITH PROPOFOL  N/A 02/01/2023   Procedure: COLONOSCOPY WITH PROPOFOL ;  Surgeon: Normie Becton., MD;  Location: [facility];  Service: Gastroenterology;  Laterality: N/A;   ESOPHAGOGASTRODUODENOSCOPY N/A 02/01/2023   Procedure: ESOPHAGOGASTRODUODENOSCOPY (EGD);  Surgeon: Normie Becton., MD;  Location: [facility];  Service:  Gastroenterology;  Laterality: N/A;   EUS N/A 02/01/2023   Procedure: LOWER ENDOSCOPIC ULTRASOUND (EUS);  Surgeon: Normie Becton., MD;  Location: [facility];  Service: Gastroenterology;  Laterality: N/A;   POLYPECTOMY  02/01/2023   Procedure: POLYPECTOMY;  Surgeon: Brice Campi Albino Alu., MD;  Location: [facility];  Service: Gastroenterology;;   Dixie Frederickson DILATION N/A 02/01/2023   Procedure: Thurman Flores;  Surgeon: Normie Becton., MD;  Location: [facility];  Service: Gastroenterology;  Laterality: N/A;   TUBAL LIGATION      OB History     Gravida  8   Para  4   Term  4   Preterm      AB  4   Living  4      SAB      IAB      Ectopic      Multiple      Live Births  4            Home Medications    Prior to Admission medications   Medication Sig Start Date End Date Taking? Authorizing Provider  fluconazole  (DIFLUCAN ) 150 MG tablet Take 1 tablet (150 mg total) by mouth daily for 2 days. take 1 tablet at first signs of a yeast infection and then repeat in 3 days 08/03/23 08/05/23 Yes Sumaya Riedesel, Marjory Signs, PA-C  nitrofurantoin , macrocrystal-monohydrate, (MACROBID ) 100 MG capsule Take 1 capsule (100 mg total) by mouth 2 (two) times daily. 08/03/23  Yes Cristhian Vanhook A, PA-C  albuterol  (  PROVENTIL ) (2.5 MG/3ML) 0.083% nebulizer solution Take 3 mLs (2.5 mg total) by nebulization every 6 (six) hours as needed for wheezing or shortness of breath. 07/02/19 11/22/22  Vernell Goldsmith, MD  albuterol  (VENTOLIN  HFA) 108 [phone] Base) MCG/ACT inhaler Inhale 1-2 puffs into the lungs every 6 (six) hours as needed for wheezing or shortness of breath. 05/18/20   Marius Siemens, NP  AMBULATORY NON FORMULARY MEDICATION [employer] 1 capsule by mouth daily    [provider]  AMBULATORY NON FORMULARY MEDICATION URO vaginal probiotic 1 capsule by mouth daily    [provider]  cetirizine -pseudoephedrine  (ZYRTEC -D ALLERGY & CONGESTION) 5-120 MG  tablet Take 1 tablet by mouth 2 (two) times daily. 09/14/20   Marius Siemens, NP  Cholecalciferol (VITAMIN D -3 PO) Take 1 capsule by mouth daily.    [provider]  COLLAGEN PO Take 1 capsule by mouth daily.    [provider]  Cyanocobalamin (VITAMIN B 12 PO) Take 1 tablet by mouth daily.    [provider]  cyclobenzaprine  (FLEXERIL ) 5 MG tablet Take by mouth. 12/09/22   [provider]  ferrous sulfate 325 (65 FE) MG EC tablet Take 325 mg by mouth daily.    [provider]  fluticasone  (FLONASE ) 50 MCG/ACT nasal spray Place 2 sprays into both nostrils daily. 09/14/20   Marius Siemens, NP  hydrocortisone  (ANUSOL -HC) 25 MG suppository Place 1 suppository (25 mg total) rectally at bedtime. Nightly for 4 nights in a row use as needed 02/01/23 02/01/24  Mansouraty, Albino Alu., MD  meloxicam  (MOBIC ) 15 MG tablet Take 1 tablet by mouth daily. Patient not taking: Reported on 03/08/2023 12/10/22   [provider]  Multiple Vitamin (MULTIVITAMIN) capsule Take 1 capsule by mouth daily.    [provider]  sucralfate  (CARAFATE ) 1 GM/10ML suspension Take 10 mLs (1 g total) by mouth 2 (two) times daily. 02/01/23 02/01/24  Mansouraty, Albino Alu., MD  Vitamin D , Ergocalciferol , (DRISDOL) 1.25 MG (50000 UNIT) CAPS capsule Take 1 capsule every week by oral route for 90 days.    [provider]    Family History Family History  Problem Relation Age of Onset   Lung cancer Mother    Diabetes Father    Hypertension Father    Throat cancer Maternal Aunt    Colon cancer Neg Hx    Esophageal cancer Neg Hx    Inflammatory bowel disease Neg Hx    Liver disease Neg Hx    Pancreatic cancer Neg Hx    Rectal cancer Neg Hx    Stomach cancer Neg Hx     Social History Social History   Tobacco Use   Smoking status: Former    Types: Cigars    Quit date: 12/22/2003    Years since quitting: 19.6   Smokeless tobacco: Never   Tobacco  comments:    Not an everyday smoker.   Vaping Use   Vaping status: Never Used  Substance Use Topics   Alcohol use: Yes    Comment: socially    Drug use: No     Allergies   Latex   Review of Systems Review of Systems  Constitutional:  Negative for chills and fever.  HENT:  Negative for ear pain and sore throat.   Eyes:  Negative for pain and visual disturbance.  Respiratory:  Negative for cough and shortness of breath.   Cardiovascular:  Negative for chest pain and palpitations.  Gastrointestinal:  Negative for abdominal  pain and vomiting.  Genitourinary:  Positive for difficulty urinating, dysuria, frequency, hematuria, pelvic pain and urgency.  Musculoskeletal:  Negative for arthralgias and back pain.  Skin:  Negative for color change and rash.  Neurological:  Negative for seizures and syncope.  All other systems reviewed and are negative.    Physical Exam Triage Vital Signs ED Triage Vitals [08/03/23 1249]  Encounter Vitals Group     BP 135/86     Girls Systolic BP Percentile      Girls Diastolic BP Percentile      Boys Systolic BP Percentile      Boys Diastolic BP Percentile      Pulse Rate 79     Resp 18     Temp 98.3 F (36.8 C)     Temp Source Oral     SpO2 96 %     Weight      Height      Head Circumference      Peak Flow      Pain Score 6     Pain Loc      Pain Education      Exclude from Growth Chart    No data found.  Updated Vital Signs BP 135/86 (BP Location: Left Arm)   Pulse 79   Temp 98.3 F (36.8 C) (Oral)   Resp 18   LMP 08/21/2022   SpO2 96%   Visual Acuity Right Eye Distance:   Left Eye Distance:   Bilateral Distance:    Right Eye Near:   Left Eye Near:    Bilateral Near:     Physical Exam Vitals and nursing note reviewed.  Constitutional:      General: She is not in acute distress.    Appearance: She is well-developed.  HENT:     Head: Normocephalic and atraumatic.   Eyes:     Conjunctiva/sclera: Conjunctivae  normal.    Cardiovascular:     Rate and Rhythm: Normal rate and regular rhythm.     Heart sounds: No murmur heard. Pulmonary:     Effort: Pulmonary effort is normal. No respiratory distress.     Breath sounds: Normal breath sounds.  Abdominal:     Palpations: Abdomen is soft.     Tenderness: There is abdominal tenderness in the suprapubic area. There is no right CVA tenderness, left CVA tenderness, guarding or rebound.     Comments: Mild suprapubic pain   Musculoskeletal:        General: No swelling.     Cervical back: Neck supple.   Skin:    General: Skin is warm and dry.     Capillary Refill: Capillary refill takes less than 2 seconds.   Neurological:     Mental Status: She is alert.   Psychiatric:        Mood and Affect: Mood normal.      UC Treatments / Results  Labs (all labs ordered are listed, but only abnormal results are displayed) Labs Reviewed  POCT URINALYSIS DIP (MANUAL ENTRY) - Abnormal; Notable for the following components:      Result Value   Color, UA straw (*)    Clarity, UA cloudy (*)    Blood, UA moderate (*)    Protein Ur, POC trace (*)    Nitrite, UA Positive (*)    Leukocytes, UA Small (1+) (*)    All other components within normal limits    EKG   Radiology No results found.  Procedures Procedures (including critical  care time)  Medications Ordered in UC Medications - No data to display  Initial Impression / Assessment and Plan / UC Course  I have reviewed the triage vital signs and the nursing notes.  Pertinent labs & imaging results that were available during my care of the patient were reviewed by me and considered in my medical decision making (see chart for details).     Lower urinary tract infectious disease   Urinalysis shows Evonda Enge blood cells, nitrites and red blood cells all consistent with a urinary tract infection. Symptoms and physical exam findings also consistent with a urinary tract infection. We will treat with  the following:  Macrobid  100 mg twice daily for 5 days. This is an antibiotic. Diflucan  150 mg take 1 tablet at first signs of a yeast infection and then repeat in 3 days.  Drink plenty of water and stay hydrated.   Return to urgent care or PCP if symptoms worsen or fail to resolve.    Final Clinical Impressions(s) / UC Diagnoses   Final diagnoses:  Lower urinary tract infectious disease     Discharge Instructions      Urinalysis shows Kash Mothershead blood cells, nitrites and red blood cells all consistent with a urinary tract infection. Symptoms and physical exam findings also consistent with a urinary tract infection. We will treat with the following:  Macrobid  100 mg twice daily for 5 days. This is an antibiotic. Diflucan  150 mg take 1 tablet at first signs of a yeast infection and then repeat in 3 days.  Drink plenty of water and stay hydrated.   Return to urgent care or PCP if symptoms worsen or fail to resolve.       ED Prescriptions     Medication Sig Dispense Auth. Provider   nitrofurantoin , macrocrystal-monohydrate, (MACROBID ) 100 MG capsule Take 1 capsule (100 mg total) by mouth 2 (two) times daily. 10 capsule Lorenzo Romberg A, PA-C   fluconazole  (DIFLUCAN ) 150 MG tablet Take 1 tablet (150 mg total) by mouth daily for 2 days. take 1 tablet at first signs of a yeast infection and then repeat in 3 days 2 tablet Kreg Pesa, PA-C      PDMP not reviewed this encounter.   Kreg Pesa, [place] 08/03/23 1351

## 2023-08-03 NOTE — Discharge Instructions (Addendum)
 Urinalysis shows Michele Trevino blood cells, nitrites and red blood cells all consistent with a urinary tract infection. Symptoms and physical exam findings also consistent with a urinary tract infection. We will treat with the following:  Macrobid  100 mg twice daily for 5 days. This is an antibiotic. Diflucan  150 mg take 1 tablet at first signs of a yeast infection and then repeat in 3 days.  Drink plenty of water and stay hydrated.   Return to urgent care or PCP if symptoms worsen or fail to resolve.

## 2023-10-25 ENCOUNTER — Encounter: Payer: Self-pay | Admitting: Physician Assistant

## 2023-10-25 ENCOUNTER — Ambulatory Visit: Payer: Self-pay | Admitting: Physician Assistant

## 2023-10-25 ENCOUNTER — Ambulatory Visit: Payer: Self-pay

## 2023-10-25 VITALS — BP 124/87 | HR 73 | Ht 61.0 in | Wt 205.0 lb

## 2023-10-25 DIAGNOSIS — M5441 Lumbago with sciatica, right side: Secondary | ICD-10-CM

## 2023-10-25 DIAGNOSIS — E559 Vitamin D deficiency, unspecified: Secondary | ICD-10-CM

## 2023-10-25 DIAGNOSIS — E538 Deficiency of other specified B group vitamins: Secondary | ICD-10-CM

## 2023-10-25 DIAGNOSIS — M5442 Lumbago with sciatica, left side: Secondary | ICD-10-CM

## 2023-10-25 MED ORDER — METHYLPREDNISOLONE 4 MG PO TBPK: ORAL_TABLET | ORAL | 0 refills | Status: DC

## 2023-10-25 MED ORDER — CYCLOBENZAPRINE HCL 10 MG PO TABS: 10.0000 mg | ORAL_TABLET | Freq: Three times a day (TID) | ORAL | 0 refills | Status: DC | PRN

## 2023-10-25 NOTE — Patient Instructions (Addendum)
 VISIT SUMMARY:  During today's visit, we discussed your worsening low back pain and sciatica, as well as your concerns about vitamin D  and B12 deficiencies. We reviewed your symptoms, current medications, and lifestyle modifications. We also discussed potential next steps for managing your pain and overall health.  YOUR PLAN:  -LOW BACK PAIN WITH BILATERAL SCIATICA : We will start a steroid taper to help reduce inflammation and continue using ibuprofen  for pain. You are also referred to an orthopedic specialist for further evaluation and management. A prescription for Flexeril  10 mg has been sent to help you sleep at night. Please keep your upcoming appointment with your primary care provider to discuss long-term management.  -VITAMIN D  DEFICIENCY: Vitamin D  deficiency can lead to bone pain and muscle weakness. We have ordered a vitamin D  level test to check your current status. Based on the results, you may need to start daily vitamin D  supplementation.  -VITAMIN B DEFICIENCY:. We have ordered a vitamin B12 level test to assess your current status.  Sciatica  Sciatica is pain, numbness, weakness, or tingling along the path of the sciatic nerve. The sciatic nerve starts in the lower back and runs down the back of each leg. The nerve controls the muscles in the lower leg and in the back of the knee. It also provides feeling (sensation) to the back of the thigh, the lower leg, and the sole of the foot. Sciatica is a symptom of another medical condition that pinches or puts pressure on the sciatic nerve. Sciatica most often only affects one side of the body. Sciatica usually goes away on its own or with treatment. In some cases, sciatica may come back (recur). What are the causes? This condition is caused by pressure on the sciatic nerve or pinching of the nerve. This may be the result of: A disk in between the bones of the spine bulging out too far (herniated disk). Age-related changes in the spinal  disks. A pain disorder that affects a muscle in the buttock. Extra bone growth near the sciatic nerve. A break (fracture) of the pelvis. Pregnancy. Tumor. This is rare. What increases the risk? The following factors may make you more likely to develop this condition: Playing sports that place pressure or stress on the spine. Having poor strength and flexibility. A history of back injury or surgery. Sitting for long periods of time. Doing activities that involve repetitive bending or lifting. Obesity. What are the signs or symptoms? Symptoms can vary from mild to very severe. They may include: Any of the following problems in the lower back, leg, hip, or buttock: Mild tingling, numbness, or dull aches. Burning sensations. Sharp pains. Numbness in the back of the calf or the sole of the foot. Leg weakness. Severe back pain that makes movement difficult. Symptoms may get worse when you cough, sneeze, or laugh, or when you sit or stand for long periods of time. How is this diagnosed? This condition may be diagnosed based on: Your symptoms and medical history. A physical exam. Blood tests. Imaging tests, such as: X-rays. An MRI. A CT scan. How is this treated? In many cases, this condition improves on its own without treatment. However, treatment may include: Reducing or modifying physical activity. Exercising, including strengthening and stretching. Icing and applying heat to the affected area. Medicines that help to: Relieve pain and swelling. Relax your muscles. Injections of medicines that help to relieve pain and inflammation (steroids) around the sciatic nerve. Surgery. Follow these instructions at home:  Medicines Take over-the-counter and prescription medicines only as told by your health care provider. Ask your health care provider if the medicine prescribed to you requires you to avoid driving or using heavy machinery. Managing pain     If directed, put ice on  the affected area. To do this: Put ice in a plastic bag. Place a towel between your skin and the bag. Leave the ice on for 20 minutes, 2-3 times a day. If your skin turns bright red, remove the ice right away to prevent skin damage. The risk of skin damage is higher if you cannot feel pain, heat, or cold. If directed, apply heat to the affected area as often as told by your health care provider. Use the heat source that your health care provider recommends, such as a moist heat pack or a heating pad. Place a towel between your skin and the heat source. Leave the heat on for 20-30 minutes. If your skin turns bright red, remove the heat right away to prevent burns. The risk of burns is higher if you cannot feel pain, heat, or cold. Activity  Return to your normal activities as told by your health care provider. Ask your health care provider what activities are safe for you. Avoid activities that make your symptoms worse. Take brief periods of rest throughout the day. When you rest for longer periods, mix in some mild activity or stretching between periods of rest. This will help to prevent stiffness and pain. Avoid sitting for long periods of time without moving. Get up and move around at least one time each hour. Exercise and stretch regularly as told by your health care provider. Do not lift anything that is heavier than 10 lb (4.5 kg) until your health care provider says that it is safe. When you do not have symptoms, you should still avoid heavy lifting, especially repetitive heavy lifting. When you lift objects, always use proper lifting technique, which includes: Bending your knees. Keeping the load close to your body. Avoiding twisting. General instructions Maintain a healthy weight. Excess weight puts extra stress on your back. Wear supportive, comfortable shoes. Avoid wearing high heels. Avoid sleeping on a mattress that is too soft or too hard. A mattress that is firm enough to  support your back when you sleep may help to reduce your pain. Contact a health care provider if: Your pain is not controlled by medicine. Your pain does not improve or gets worse. Your pain lasts longer than 4 weeks. You have unexplained weight loss. Get help right away if: You are not able to control when you urinate or have bowel movements (incontinence). You have: Weakness in your lower back, pelvis, buttocks, or legs that gets worse. Redness or swelling of your back. A burning sensation when you urinate. Summary Sciatica is pain, numbness, weakness, or tingling along the path of the sciatic nerve, which may include the lower back, legs, hips, and buttocks. This condition is caused by pressure on the sciatic nerve or pinching of the nerve. Treatment often includes rest, exercise, medicines, and applying ice or heat. This information is not intended to replace advice given to you by your health care provider. Make sure you discuss any questions you have with your health care provider. Document Revised: 05/16/2021 Document Reviewed: 05/16/2021 Elsevier Patient Education  2024 [employer].

## 2023-10-25 NOTE — Telephone Encounter (Signed)
 FYI Only or Action Required?: FYI only for provider.  Patient was last seen in primary care on 10/10/2022 by Celestia Rosaline SQUIBB, NP.  Called Nurse Triage reporting Back Pain.  Symptoms began 2 weeks.  Interventions attempted: Nothing.  Symptoms are: gradually worsening.  Triage Disposition: See Physician Within 24 Hours  Patient/caregiver understands and will follow disposition?: yes  Advised to go to [facility] d/t no appts today. Address provided. Pt wanted to make appt also.            Copied from CRM [id number]. Topic: Clinical - Red Word Triage >> Oct 25, 2023  9:36 AM Mia F wrote: Red Word that prompted transfer to Nurse Triage: Ongoing back pain from spine problems. She is currently experiencing numbness that is radiating down her legs down into her toes. Reason for Disposition  Numbness in a leg or foot (i.e., loss of sensation)  Answer Assessment - Initial Assessment Questions 1. ONSET: When did the pain begin? (e.g., minutes, hours, days)     2 weeks  2. LOCATION: Where does it hurt? (upper, mid or lower back)     Lower pain  3. SEVERITY: How bad is the pain?  (e.g., Scale 1-10; mild, moderate, or severe)     7 4. PATTERN: Is the pain constant? (e.g., yes, no; constant, intermittent)      Constant  5. RADIATION: Does the pain shoot into your legs or somewhere else?     Yes numbness 6. CAUSE:  What do you think is causing the back pain?      Spinal stenosis 7. BACK OVERUSE:  Any recent lifting of heavy objects, strenuous work or exercise?     Spinal stenosis 8. MEDICINES: What have you taken so far for the pain? (e.g., nothing, acetaminophen , NSAIDS)     Pain med body swollen  9. NEUROLOGIC SYMPTOMS: Do you have any weakness, numbness, or problems with bowel/bladder control?     Numbness  10. OTHER SYMPTOMS: Do you have any other symptoms? (e.g., fever, abdomen pain, burning with urination, blood in urine)       no 11. PREGNANCY: Is  there any chance you are pregnant? When was your last menstrual period?       N/a  Protocols used: Back Pain-A-AH

## 2023-10-25 NOTE — Progress Notes (Signed)
 Established Patient Office Visit  Subjective   Patient ID: Michele Trevino, female    DOB: December 16, 1975  Age: 48 y.o. MRN: [id number]  Chief Complaint  Patient presents with   Back Pain    Patient explains pain as a midline pain that radiates to her lower back and shoots down around her pelvis area. Pain score 7. Flare up started proximately 2 weeks ago an has worsen over time. OTC pain medications alleviate pain for a short period    Discussed the use of AI scribe software for clinical note transcription with the patient, who gave verbal consent to proceed.  History of Present Illness  [name] is a 48 year old female with spinal stenosis who presents with worsening low back pain radiating to the legs.  She experiences low back pain radiating down both legs, more pronounced on the left side, originating in the middle of her lower back and extending down the creases of her legs. This pain causes her to limp and has persisted for one to two weeks without relief from ibuprofen . The pain is described as shooting and is accompanied by numbness and tingling in her feet and hands, referred to as spasms resulting in numbness in both legs. The severity of the pain affects her daily activities, including walking and working, and she has been unable to get out of bed on some days.  She has previously tried muscle relaxers without relief and avoids stronger pain medications due to her caregiving responsibilities. She has been taking ibuprofen , two to four pills, to manage swelling and inflammation, but it has not been effective recently. She is concerned about her inability to work due to the pain and has been trying natural remedies like turmeric and ginger, and has increased her water intake to nearly a gallon a day to help manage inflammation.   Past Medical History:  Diagnosis Date   Arthritis    Asthma    Carpal tunnel syndrome    Morbid obesity (HCC)    Spinal stenosis     Social History   Socioeconomic History   Marital status: Married    Spouse name: Not on file   Number of children: 4   Years of education: Not on file   Highest education level: Some college, no degree  Occupational History    Comment: NA  Tobacco Use   Smoking status: Former    Types: Cigars    Quit date: 12/22/2003    Years since quitting: 19.8   Smokeless tobacco: Never   Tobacco comments:    Not an everyday smoker.   Vaping Use   Vaping status: Never Used  Substance and Sexual Activity   Alcohol use: Yes    Comment: socially    Drug use: No   Sexual activity: Yes    Birth control/protection: Surgical  Other Topics Concern   Not on file  Social History Narrative   Lives with family   Social Drivers of Health   Financial Resource Strain: Not on file  Food Insecurity: No Food Insecurity (01/03/2022)   Hunger Vital Sign    Worried About Running Out of Food in the Last Year: Never true    Ran Out of Food in the Last Year: Never true  Transportation Needs: No Transportation Needs (01/03/2022)   PRAPARE - Administrator, Civil Service (Medical): No    Lack of Transportation (Non-Medical): No  Physical Activity: Not on file  Stress: Not on file  Social Connections: Not on file  Intimate Partner Violence: Not on file   Family History  Problem Relation Age of Onset   Lung cancer Mother    Diabetes Father    Hypertension Father    Throat cancer Maternal Aunt    Colon cancer Neg Hx    Esophageal cancer Neg Hx    Inflammatory bowel disease Neg Hx    Liver disease Neg Hx    Pancreatic cancer Neg Hx    Rectal cancer Neg Hx    Stomach cancer Neg Hx    Allergies  Allergen Reactions   Latex Swelling    Review of Systems  Constitutional: Negative.   HENT: Negative.    Eyes: Negative.   Respiratory:  Negative for shortness of breath.   Cardiovascular:  Negative for chest pain.  Gastrointestinal:  Negative for nausea and vomiting.  Genitourinary:   Negative for dysuria.  Musculoskeletal:  Positive for back pain, joint pain and myalgias.  Skin: Negative.   Neurological: Negative.   Endo/Heme/Allergies: Negative.   Psychiatric/Behavioral: Negative.        Objective:     BP 124/87 (BP Location: Left Arm, Patient Position: Sitting, Cuff Size: Large)   Pulse 73   Ht 5' 1 (1.549 m)   Wt 205 lb (93 kg)   LMP 08/21/2022   SpO2 96%   BMI 38.73 kg/m  BP Readings from Last 3 Encounters:  10/25/23 124/87  08/03/23 135/86  03/08/23 (!) 139/92   Wt Readings from Last 3 Encounters:  10/25/23 205 lb (93 kg)  03/08/23 205 lb (93 kg)  02/01/23 198 lb (89.8 kg)    Physical Exam Vitals and nursing note reviewed.  Constitutional:      Appearance: Normal appearance.  HENT:     Head: Normocephalic and atraumatic.     Right Ear: External ear normal.     Left Ear: External ear normal.     Nose: Nose normal.     Mouth/Throat:     Mouth: Mucous membranes are moist.     Pharynx: Oropharynx is clear.  Eyes:     Extraocular Movements: Extraocular movements intact.     Conjunctiva/sclera: Conjunctivae normal.     Pupils: Pupils are equal, round, and reactive to light.  Cardiovascular:     Rate and Rhythm: Normal rate and regular rhythm.     Pulses: Normal pulses.          Dorsalis pedis pulses are 2+ on the right side and 2+ on the left side.       Posterior tibial pulses are 2+ on the right side and 2+ on the left side.     Heart sounds: Normal heart sounds.  Pulmonary:     Effort: Pulmonary effort is normal.     Breath sounds: Normal breath sounds.  Musculoskeletal:     Cervical back: Normal, normal range of motion and neck supple.     Thoracic back: Decreased range of motion.     Lumbar back: Tenderness present. Decreased range of motion.  Skin:    General: Skin is warm and dry.  Neurological:     General: No focal deficit present.     Mental Status: She is alert and oriented to person, place, and time.     Motor: No  weakness.  Psychiatric:        Mood and Affect: Mood normal.        Behavior: Behavior normal.        Thought Content: Thought  content normal.        Judgment: Judgment normal.        Assessment & Plan:   Problem List Items Addressed This Visit   None Visit Diagnoses       Vitamin D  deficiency    -  Primary   Relevant Orders   Vitamin D , 25-hydroxy     Vitamin B12 deficiency       Relevant Orders   Vitamin B12     Acute bilateral low back pain with bilateral sciatica       Relevant Medications   methylPREDNISolone  (MEDROL  DOSEPAK) 4 MG TBPK tablet   cyclobenzaprine  (FLEXERIL ) 10 MG tablet   Other Relevant Orders   Ambulatory referral to Orthopedic Surgery       Assessment and Plan Low back pain bilateral with bilateral sciatica Trial Medrol  Dosepak, ibuprofen , Flexeril .  Patient education given on supportive care.  Red flags given for prompt reevaluation.  Referred to orthopedics for further evaluation.  Patient encouraged to continue with follow-up appointment with primary care provider in 2 weeks  Vitamin D  deficiency - Order vitamin D  level to assess current status.   Vitamin B12 deficiency Patient reported history of Vitamin B12 deficiency with previous cessation of supplementation.  - Order vitamin B12 level to assess current status.   I have reviewed the patient's medical history (PMH, PSH, Social History, Family History, Medications, and allergies) , and have been updated if relevant. I spent 30 minutes reviewing chart and  face to face time with patient.   Return if symptoms worsen or fail to improve.    Kirk RAMAN Mayers, PA-C

## 2023-10-26 LAB — VITAMIN B12: Vitamin B-12: 1134 pg/mL (ref 232–1245)

## 2023-10-26 LAB — VITAMIN D 25 HYDROXY (VIT D DEFICIENCY, FRACTURES): Vit D, 25-Hydroxy: 63.3 ng/mL (ref 30.0–100.0)

## 2023-10-29 ENCOUNTER — Ambulatory Visit: Payer: Self-pay | Admitting: Physician Assistant

## 2023-11-09 ENCOUNTER — Telehealth: Payer: Self-pay | Admitting: Primary Care

## 2023-11-09 NOTE — Telephone Encounter (Signed)
 1st attempt: Called patient to reschedule appointment for 9/25 as provider will not be in the office that day. Left Voicemail for patient to call back.

## 2023-11-12 NOTE — Telephone Encounter (Signed)
 2nd attempt: Called patient to reschedule appointment for 9/25 as provider will not be in the office that day. Left Voicemail for patient to call back.

## 2023-11-15 ENCOUNTER — Ambulatory Visit (INDEPENDENT_AMBULATORY_CARE_PROVIDER_SITE_OTHER): Payer: Self-pay | Admitting: Primary Care

## 2023-11-20 ENCOUNTER — Encounter: Payer: Self-pay | Admitting: Physical Medicine and Rehabilitation

## 2023-11-20 ENCOUNTER — Ambulatory Visit: Payer: Self-pay | Admitting: Physical Medicine and Rehabilitation

## 2023-11-20 DIAGNOSIS — M5441 Lumbago with sciatica, right side: Secondary | ICD-10-CM

## 2023-11-20 DIAGNOSIS — M5442 Lumbago with sciatica, left side: Secondary | ICD-10-CM

## 2023-11-20 DIAGNOSIS — M5416 Radiculopathy, lumbar region: Secondary | ICD-10-CM

## 2023-11-20 DIAGNOSIS — M48062 Spinal stenosis, lumbar region with neurogenic claudication: Secondary | ICD-10-CM

## 2023-11-20 DIAGNOSIS — G8929 Other chronic pain: Secondary | ICD-10-CM

## 2023-11-20 NOTE — Progress Notes (Signed)
 Michele Trevino - 48 y.o. female MRN [id number]  Date of birth: [birth year]  Office Visit Note: Visit Date: 11/20/2023 PCP: Celestia Rosaline SQUIBB, NP Referred by: Celestia Rosaline SQUIBB, NP  Subjective: Chief Complaint  Patient presents with   Lower Back - Pain   HPI: Michele Trevino is a 48 y.o. female who comes in today per the request of Cari Mayers, PA for evaluation of chronic, worsening and severe bilateral lower back pain radiating down both legs, right greater than left. Also reports paresthesias to legs and feet. Pain ongoing since 2019. Her pain worsens with prolonged standing and walking. Also reports difficulty sleeping due to discomfort. She describes pain as sharp and stabbing sensation, currently rates as 8 out of 10. Some relief of pain with home exercise regimen, rest and use of medications. History of formal physical therapy for more pelvic pain. Lumbar MRI imaging from 2021 shows severe spinal canal stenosis at L4-L5 and L5-S1. She was previously treated by Dr. Victory Gunnels, per patient he did recommend surgical intervention, however she declined surgery at that time. She did undergo several lumbar epidural steroid injections at [facility], minimal relief of pain with these procedures. She is currently out of work at this time due to issues with pain. Patient denies focal weakness. She reports recent fall while standing in bath tub about 3 weeks, no injuries from fall.      Review of Systems  Musculoskeletal:  Positive for back pain.  Neurological:  Negative for tingling, sensory change, focal weakness and weakness.  All other systems reviewed and are negative.  Otherwise per HPI.  Assessment & Plan: Visit Diagnoses:    ICD-10-CM   1. Chronic bilateral low back pain with bilateral sciatica  M54.42 MR LUMBAR SPINE WO CONTRAST   M54.41    G89.29     2. Lumbar radiculopathy  M54.16 MR LUMBAR SPINE WO CONTRAST    3. Spinal stenosis of lumbar region  with neurogenic claudication  M48.062 MR LUMBAR SPINE WO CONTRAST       Plan: Findings:  Chronic, worsening and severe bilateral lower back pain radiating down legs, right greater than left.  Patient continues to have severe pain despite good conservative therapies such as home exercise regimen, rest and use of medications.  Patient's clinical presentation and exam are consistent with neurogenic claudication as a result of spinal canal stenosis.  Prior lumbar MRI imaging from 2021 shows severe spinal canal stenosis at the levels of L4-L5 and L5-S1. We discussed treatment plan in detail today. Next step is to obtain new lumbar MRI imaging. Depending on results of MRI imaging we discussed possibility of lumbar epidural steroid injections. I also briefly discussed possibility of consultation with our spine surgeon Dr. Ozell Ada. She is hesitant to undergo surgery, however I do think it would beneficial to speak with Dr. Ada at some point. We will see her back for lumbar MRI review. No red flag symptoms noted upon exam today.     Meds & Orders: No orders of the defined types were placed in this encounter.   Orders Placed This Encounter  Procedures   MR LUMBAR SPINE WO CONTRAST    Follow-up: Return for Lumbar MRI review.   Procedures: No procedures performed      Clinical History: Narrative & Impression   [facility] [address] [place], [place] [address] [phone]   NEUROIMAGING REPORT     STUDY DATE: 09/28/2019 PATIENT NAME: Michele Trevino  DOB: 26-Sep-1975 MRN: [id number]   EXAM: MRI of the lumbar spine without contrast   ORDERING CLINICIAN: Eduard Hanlon MD CLINICAL HISTORY: 48 year old woman with bilateral sciatica and back pain COMPARISON FILMS: None   TECHNIQUE: MRI of the lumbar spine was obtained utilizing 4 mm sagittal slices from T11-12 down to the lower sacrum with T1, T2 and inversion recovery views. In addition 4 mm axial  slices from L1-2 down to L5-S1 level were included with T1 and T2 weighted views. CONTRAST: None IMAGING SITE: [facility], [address] [place], [place], [place]   FINDINGS: On sagittal images, the spine is imaged from T11 to the sacrum.   The conus medullaris and cauda equine appear normal.   There is 1 to 2 mm anterolisthesis of L4 upon L5 of a degenerative nature.   The vertebral bodies have normal signal.     The discs and interspaces were further evaluated on axial views from T12 to S1 as follows:   T12-L1: The disc and interspace appear normal.   L1-L2: The disc and interspace appear normal.   L2-L3: The disc and interspace appear normal.   L3-L4: There is disc bulging and ligamenta flava hypertrophy causing mild spinal stenosis.  There is no nerve root compression.   L4-L5: There is severe spinal stenosis (AP diameter 5.8 mm) due to minimal anterolisthesis, bulging of the uncovered disc, severe facet hypertrophy and ligamenta flava hypertrophy.  There is mild left and mild to moderate right foraminal narrowing.  There is also mild left and mild to moderate right lateral recess stenosis.  There does not appear to be nerve root compression.   L5-S1: There is severe spinal stenosis (AP diameter 5.8 mm) due to disc bulging, facet hypertrophy and severe ligamenta flava hypertrophy.  Additionally, there is increased epidural fat..  There is mild foraminal narrowing and mild lateral recess stenosis but there does not appear to be nerve root compression.     IMPRESSION: This MRI of the lumbar spine without contrast shows the following: 1.   At L3-L4, there is mild spinal stenosis due to degenerative changes.  There is no nerve root compression. 2.   At L4-L5, there is severe spinal stenosis due to degenerative changes and mild anterolisthesis.  There is mild to moderate right foraminal and lateral recess narrowing but there does not appear to be any definite nerve root compression. 3.   At  L5-S1, there is severe spinal stenosis due to degenerative changes.  There does not appear to be nerve root compression.       INTERPRETING PHYSICIAN:  Richard A. Vear, MD, PhD, FAAN Certified in  Neuroimaging by [employer]   She reports that she quit smoking about 19 years ago. Her smoking use included cigars. She has never used smokeless tobacco.  Recent Labs    03/08/23 1129  HGBA1C 5.7*    Objective:  VS:  HT:    WT:   BMI:     BP:   HR: bpm  TEMP: ( )  RESP:  Physical Exam Vitals and nursing note reviewed.  HENT:     Head: Normocephalic and atraumatic.     Right Ear: External ear normal.     Left Ear: External ear normal.     Nose: Nose normal.     Mouth/Throat:     Mouth: Mucous membranes are moist.  Eyes:     Extraocular Movements: Extraocular movements intact.  Cardiovascular:     Rate and Rhythm: Normal rate.  Pulses: Normal pulses.  Pulmonary:     Effort: Pulmonary effort is normal.  Abdominal:     General: Abdomen is flat. There is no distension.  Musculoskeletal:        General: Tenderness present.     Comments: Patient rises from seated position to standing without difficulty. Pain noted with facet loading and lumbar extension. 5/5 strength noted with bilateral hip flexion, knee flexion/extension, ankle dorsiflexion/plantarflexion and EHL. No clonus noted bilaterally. No pain upon palpation of greater trochanters. No pain with internal/external rotation of bilateral hips. Sensation intact bilaterally. Myofascial tenderness noted upon palpation upon palpation of bilateral thoracic and lumbar paraspinal regions. Negative slump test bilaterally. Ambulates without aid, gait steady.     Skin:    General: Skin is warm and dry.     Capillary Refill: Capillary refill takes less than 2 seconds.  Neurological:     General: No focal deficit present.     Mental Status: She is alert and oriented to person, place, and time.  Psychiatric:         Mood and Affect: Mood normal.        Behavior: Behavior normal.     Ortho Exam  Imaging: No results found.  Past Medical/Family/Surgical/Social History: Medications & Allergies reviewed per EMR, new medications updated. Patient Active Problem List   Diagnosis Date Noted   History of colonic polyps 02/01/2023   Nodule of colon 11/23/2022   Esophageal dysphagia 11/23/2022   GERD (gastroesophageal reflux disease) 07/28/2019   History of COVID-19 07/02/2019   Anxiety 07/02/2019   Bronchiolitis 07/02/2019   Acute non-recurrent maxillary sinusitis 07/02/2019   Morbid obesity (HCC)    Mixed incontinence 03/21/2019   UTERINE FIBROID 04/19/2006   Past Medical History:  Diagnosis Date   Arthritis    Asthma    Carpal tunnel syndrome    Morbid obesity (HCC)    Spinal stenosis    Family History  Problem Relation Age of Onset   Lung cancer Mother    Diabetes Father    Hypertension Father    Throat cancer Maternal Aunt    Colon cancer Neg Hx    Esophageal cancer Neg Hx    Inflammatory bowel disease Neg Hx    Liver disease Neg Hx    Pancreatic cancer Neg Hx    Rectal cancer Neg Hx    Stomach cancer Neg Hx    Past Surgical History:  Procedure Laterality Date   CESAREAN SECTION  [phone]   x 3   COLONOSCOPY WITH PROPOFOL  N/A 02/01/2023   Procedure: COLONOSCOPY WITH PROPOFOL ;  Surgeon: Mansouraty, Aloha Raddle., MD;  Location: [facility];  Service: Gastroenterology;  Laterality: N/A;   ESOPHAGOGASTRODUODENOSCOPY N/A 02/01/2023   Procedure: ESOPHAGOGASTRODUODENOSCOPY (EGD);  Surgeon: Wilhelmenia Aloha Raddle., MD;  Location: [facility];  Service: Gastroenterology;  Laterality: N/A;   EUS N/A 02/01/2023   Procedure: LOWER ENDOSCOPIC ULTRASOUND (EUS);  Surgeon: Wilhelmenia Aloha Raddle., MD;  Location: [facility];  Service: Gastroenterology;  Laterality: N/A;   POLYPECTOMY  02/01/2023   Procedure: POLYPECTOMY;  Surgeon: Wilhelmenia Aloha Raddle., MD;  Location: [facility];  Service: Gastroenterology;;   HARLEY DILATION N/A 02/01/2023   Procedure: HARLEY HODGKIN;  Surgeon: Wilhelmenia Aloha Raddle., MD;  Location: [facility];  Service: Gastroenterology;  Laterality: N/A;   TUBAL LIGATION     Social History   Occupational History    Comment: NA  Tobacco Use   Smoking status: Former    Types: Cigars    Quit date: 12/22/2003  Years since quitting: 19.9   Smokeless tobacco: Never   Tobacco comments:    Not an everyday smoker.   Vaping Use   Vaping status: Never Used  Substance and Sexual Activity   Alcohol use: Yes    Comment: socially    Drug use: No   Sexual activity: Yes    Birth control/protection: Surgical

## 2023-11-20 NOTE — Progress Notes (Signed)
 Pain Scale   Average Pain 7 Patient advising she has lower back pain radiating bilaterally to both legs, pain is constant        +Driver, -BT, -Dye Allergies.

## 2023-11-25 ENCOUNTER — Ambulatory Visit (HOSPITAL_COMMUNITY)
Admission: EM | Admit: 2023-11-25 | Discharge: 2023-11-25 | Disposition: A | Discharge status: Home / Self Care | Payer: Self-pay | Attending: Nurse Practitioner | Admitting: Nurse Practitioner

## 2023-11-25 ENCOUNTER — Encounter (HOSPITAL_COMMUNITY): Payer: Self-pay

## 2023-11-25 DIAGNOSIS — H5789 Other specified disorders of eye and adnexa: Secondary | ICD-10-CM

## 2023-11-25 DIAGNOSIS — H0014 Chalazion left upper eyelid: Secondary | ICD-10-CM

## 2023-11-25 MED ORDER — ERYTHROMYCIN 5 MG/GM OP OINT: TOPICAL_OINTMENT | Freq: Four times a day (QID) | OPHTHALMIC | 0 refills | Status: AC

## 2023-11-25 NOTE — ED Triage Notes (Signed)
 Patient reports that she woke this AM with left eye redness, itching, and burning. Patient states she had morning cold  in her left eye this AM.   Patient state she used some antibiotics eye gtts that her husband had from a previous prescription and states it started hurting worse.

## 2023-11-25 NOTE — ED Notes (Signed)
No response when called from waiting area. 

## 2023-11-25 NOTE — ED Provider Notes (Signed)
 [facility]    CSN: [id number] Arrival date & time: 11/25/23  1308      History   Chief Complaint No chief complaint on file.   HPI Michele Trevino is a 48 y.o. female.   Patient presents today with 1 day history of left eye redness, itching, burning, and pain.  Reports there is a bump on her left eyelid.  Reports her vision is blurry and she feels like there is something in her eye.  No eye redness or discharge, crusting or matting of the eyelids, photophobia, excessive tearing, or new headache.  No floaters in the vision.  Does not use contact lenses.  She used gatifloxacin drops that her husband had been prescribed and it made the eye worse.  No periorbital pain, redness, swelling or tenderness with extraocular movements.    Past Medical History:  Diagnosis Date   Arthritis    Asthma    Carpal tunnel syndrome    Morbid obesity (HCC)    Spinal stenosis     Patient Active Problem List   Diagnosis Date Noted   History of colonic polyps 02/01/2023   Nodule of colon 11/23/2022   Esophageal dysphagia 11/23/2022   GERD (gastroesophageal reflux disease) 07/28/2019   History of COVID-19 07/02/2019   Anxiety 07/02/2019   Bronchiolitis 07/02/2019   Acute non-recurrent maxillary sinusitis 07/02/2019   Morbid obesity (HCC)    Mixed incontinence 03/21/2019   UTERINE FIBROID 04/19/2006    Past Surgical History:  Procedure Laterality Date   CESAREAN SECTION  [phone]   x 3   COLONOSCOPY WITH PROPOFOL  N/A 02/01/2023   Procedure: COLONOSCOPY WITH PROPOFOL ;  Surgeon: Wilhelmenia Aloha Raddle., MD;  Location: [facility];  Service: Gastroenterology;  Laterality: N/A;   ESOPHAGOGASTRODUODENOSCOPY N/A 02/01/2023   Procedure: ESOPHAGOGASTRODUODENOSCOPY (EGD);  Surgeon: Wilhelmenia Aloha Raddle., MD;  Location: [facility];  Service: Gastroenterology;  Laterality: N/A;   EUS N/A 02/01/2023   Procedure: LOWER ENDOSCOPIC ULTRASOUND (EUS);  Surgeon: Wilhelmenia Aloha Raddle., MD;  Location: [facility];  Service: Gastroenterology;  Laterality: N/A;   POLYPECTOMY  02/01/2023   Procedure: POLYPECTOMY;  Surgeon: Wilhelmenia Aloha Raddle., MD;  Location: [facility];  Service: Gastroenterology;;   HARLEY DILATION N/A 02/01/2023   Procedure: HARLEY HODGKIN;  Surgeon: Wilhelmenia Aloha Raddle., MD;  Location: [facility];  Service: Gastroenterology;  Laterality: N/A;   TUBAL LIGATION      OB History     Gravida  8   Para  4   Term  4   Preterm      AB  4   Living  4      SAB      IAB      Ectopic      Multiple      Live Births  4            Home Medications    Prior to Admission medications   Medication Sig Start Date End Date Taking? Authorizing Provider  erythromycin ophthalmic ointment Place into the left eye 4 (four) times daily for 7 days. Place a 1/2 inch ribbon of ointment into the lower eyelid. 11/25/23 12/02/23 Yes Chandra Harlene DELENA, NP  albuterol  (PROVENTIL ) (2.5 MG/3ML) 0.083% nebulizer solution Take 3 mLs (2.5 mg total) by nebulization every 6 (six) hours as needed for wheezing or shortness of breath. 07/02/19 11/22/22  Brien Belvie BRAVO, MD  albuterol  (VENTOLIN  HFA) 108 (90 Base) MCG/ACT inhaler Inhale 1-2 puffs into the lungs every 6 (six) hours as  needed for wheezing or shortness of breath. 05/18/20   Celestia Rosaline SQUIBB, NP  AMBULATORY NON FORMULARY MEDICATION [employer] [place] 1 capsule by mouth daily    [provider]  AMBULATORY NON FORMULARY MEDICATION URO vaginal probiotic 1 capsule by mouth daily    [provider]  cetirizine -pseudoephedrine  (ZYRTEC -D ALLERGY & CONGESTION) 5-120 MG tablet Take 1 tablet by mouth 2 (two) times daily. 09/14/20   Celestia Rosaline SQUIBB, NP  Cholecalciferol (VITAMIN D -3 PO) Take 1 capsule by mouth daily.    [provider]  COLLAGEN PO Take 1 capsule by mouth daily.    [provider]  Cyanocobalamin (VITAMIN B 12 PO) Take 1 tablet by mouth daily.     [provider]  cyclobenzaprine  (FLEXERIL ) 10 MG tablet Take 1 tablet (10 mg total) by mouth 3 (three) times daily as needed for muscle spasms. Patient not taking: Reported on 11/25/2023 10/25/23   Mayers, Cari S, PA-C  ferrous sulfate 325 (65 FE) MG EC tablet Take 325 mg by mouth daily.    [provider]  fluticasone  (FLONASE ) 50 MCG/ACT nasal spray Place 2 sprays into both nostrils daily. 09/14/20   Celestia Rosaline SQUIBB, NP  hydrocortisone  (ANUSOL -HC) 25 MG suppository Place 1 suppository (25 mg total) rectally at bedtime. Nightly for 4 nights in a row use as needed 02/01/23 02/01/24  Mansouraty, Aloha Raddle., MD  meloxicam  (MOBIC ) 15 MG tablet Take 1 tablet by mouth daily. Patient not taking: Reported on 03/08/2023 12/10/22   [provider]  methylPREDNISolone  (MEDROL  DOSEPAK) 4 MG TBPK tablet Use per instructions on package Patient not taking: Reported on 11/25/2023 10/25/23   Mayers, Cari S, PA-C  Multiple Vitamin (MULTIVITAMIN) capsule Take 1 capsule by mouth daily.    [provider]  nitrofurantoin , macrocrystal-monohydrate, (MACROBID ) 100 MG capsule Take 1 capsule (100 mg total) by mouth 2 (two) times daily. Patient not taking: Reported on 11/25/2023 08/03/23   Teresa Norris A, PA-C  sucralfate  (CARAFATE ) 1 GM/10ML suspension Take 10 mLs (1 g total) by mouth 2 (two) times daily. 02/01/23 02/01/24  Mansouraty, Aloha Raddle., MD  Vitamin D , Ergocalciferol , (DRISDOL) 1.25 MG (50000 UNIT) CAPS capsule Take 1 capsule every week by oral route for 90 days.    [provider]    Family History Family History  Problem Relation Age of Onset   Lung cancer Mother    Diabetes Father    Hypertension Father    Throat cancer Maternal Aunt    Colon cancer Neg Hx    Esophageal cancer Neg Hx    Inflammatory bowel disease Neg Hx    Liver disease Neg Hx    Pancreatic cancer Neg Hx    Rectal cancer Neg Hx    Stomach cancer Neg Hx     Social History Social  History   Tobacco Use   Smoking status: Former    Types: Cigars    Quit date: 12/22/2003    Years since quitting: 19.9   Smokeless tobacco: Never   Tobacco comments:    Not an everyday smoker.   Vaping Use   Vaping status: Never Used  Substance Use Topics   Alcohol use: Not Currently    Comment: socially    Drug use: No     Allergies   Latex   Review of Systems Review of Systems Per HPI  Physical Exam Triage Vital Signs ED Triage Vitals [11/25/23 1431]  Encounter Vitals Group     BP 119/71  Girls Systolic BP Percentile      Girls Diastolic BP Percentile      Boys Systolic BP Percentile      Boys Diastolic BP Percentile      Pulse Rate 74     Resp 16     Temp 97.9 F (36.6 C)     Temp Source Oral     SpO2 100 %     Weight      Height      Head Circumference      Peak Flow      Pain Score 8     Pain Loc      Pain Education      Exclude from Growth Chart    No data found.  Updated Vital Signs BP 119/71 (BP Location: Left Arm)   Pulse 74   Temp 97.9 F (36.6 C) (Oral)   Resp 16   LMP 08/21/2022   SpO2 100%   Visual Acuity Right Eye Distance: 20/50 Left Eye Distance: 20/40 Bilateral Distance: 20/40  Right Eye Near:   Left Eye Near:    Bilateral Near:     Physical Exam Vitals and nursing note reviewed.  Constitutional:      General: She is not in acute distress.    Appearance: Normal appearance. She is not toxic-appearing.  HENT:     Head: Normocephalic and atraumatic.     Right Ear: External ear normal.     Left Ear: External ear normal.     Mouth/Throat:     Mouth: Mucous membranes are moist.     Pharynx: Oropharynx is clear.  Eyes:     General: No visual field deficit.       Right eye: No foreign body or discharge.        Left eye: No foreign body or discharge.     Extraocular Movements: Extraocular movements intact.     Right eye: Normal extraocular motion.     Left eye: Normal extraocular motion.     Conjunctiva/sclera:      Right eye: Right conjunctiva is not injected. No exudate or hemorrhage.    Left eye: Left conjunctiva is not injected. No exudate or hemorrhage.    Pupils: Pupils are equal, round, and reactive to light.     Left eye: No corneal abrasion or fluorescein uptake.     Comments: Left upper eyelid chalazion  Pulmonary:     Effort: Pulmonary effort is normal. No respiratory distress.  Musculoskeletal:     Cervical back: Normal range of motion.  Lymphadenopathy:     Cervical: No cervical adenopathy.  Skin:    General: Skin is warm and dry.     Coloration: Skin is not jaundiced or pale.     Findings: No erythema.  Neurological:     Mental Status: She is alert and oriented to person, place, and time.  Psychiatric:        Behavior: Behavior is cooperative.      UC Treatments / Results  Labs (all labs ordered are listed, but only abnormal results are displayed) Labs Reviewed - No data to display  EKG   Radiology No results found.  Procedures Procedures (including critical care time)  Medications Ordered in UC Medications - No data to display  Initial Impression / Assessment and Plan / UC Course  I have reviewed the triage vital signs and the nursing notes.  Pertinent labs & imaging results that were available during my care of the patient were reviewed  by me and considered in my medical decision making (see chart for details).   Patient is well-appearing, normotensive, afebrile, not tachycardic, not tachypneic, oxygenating well on room air.   1. Irritation of left eye 2. Chalazion of left upper eyelid Discussed warm compresses for chalazion Given eye irritation, start erythromycin eye drops Return and ER precautions discussed with patient Recommended follow up with ophtho with no improvement   The patient was given the opportunity to ask questions.  All questions answered to their satisfaction.  The patient is in agreement to this plan.   Final Clinical Impressions(s) / UC  Diagnoses   Final diagnoses:  Irritation of left eye  Chalazion of left upper eyelid     Discharge Instructions      Recommend warm compresses for the stye of your left eye.  You can use the erythromycin ointment additionally to help with eye irritation.  Follow up with Ophthalmology if symptoms do not improve with treatment.  If symptoms worsen despite treatment, please seek emergent care.   ED Prescriptions     Medication Sig Dispense Auth. Provider   erythromycin ophthalmic ointment Place into the left eye 4 (four) times daily for 7 days. Place a 1/2 inch ribbon of ointment into the lower eyelid. 3.5 g Chandra Harlene LABOR, NP      PDMP not reviewed this encounter.   Chandra Harlene LABOR, NP 11/25/23 1524

## 2023-11-25 NOTE — Discharge Instructions (Signed)
 Recommend warm compresses for the stye of your left eye.  You can use the erythromycin ointment additionally to help with eye irritation.  Follow up with Ophthalmology if symptoms do not improve with treatment.  If symptoms worsen despite treatment, please seek emergent care.

## 2023-12-02 ENCOUNTER — Other Ambulatory Visit: Payer: Self-pay

## 2023-12-15 ENCOUNTER — Ambulatory Visit
Admission: RE | Admit: 2023-12-15 | Discharge: 2023-12-15 | Disposition: A | Payer: Self-pay | Source: Ambulatory Visit | Attending: Physical Medicine and Rehabilitation | Admitting: Physical Medicine and Rehabilitation

## 2023-12-15 DIAGNOSIS — G8929 Other chronic pain: Secondary | ICD-10-CM

## 2023-12-15 DIAGNOSIS — M5416 Radiculopathy, lumbar region: Secondary | ICD-10-CM

## 2023-12-15 DIAGNOSIS — M48062 Spinal stenosis, lumbar region with neurogenic claudication: Secondary | ICD-10-CM

## 2023-12-24 ENCOUNTER — Encounter: Payer: Self-pay | Admitting: Radiology

## 2023-12-27 ENCOUNTER — Encounter: Payer: Self-pay | Admitting: Physical Medicine and Rehabilitation

## 2023-12-27 ENCOUNTER — Ambulatory Visit: Payer: Self-pay | Admitting: Physical Medicine and Rehabilitation

## 2023-12-27 DIAGNOSIS — G8929 Other chronic pain: Secondary | ICD-10-CM

## 2023-12-27 DIAGNOSIS — M48062 Spinal stenosis, lumbar region with neurogenic claudication: Secondary | ICD-10-CM

## 2023-12-27 DIAGNOSIS — M5416 Radiculopathy, lumbar region: Secondary | ICD-10-CM

## 2023-12-27 DIAGNOSIS — M5441 Lumbago with sciatica, right side: Secondary | ICD-10-CM

## 2023-12-27 DIAGNOSIS — M5442 Lumbago with sciatica, left side: Secondary | ICD-10-CM

## 2023-12-27 NOTE — Progress Notes (Signed)
 Michele Trevino - 48 y.o. female MRN [id number]  Date of birth: [birth year]  Office Visit Note: Visit Date: 12/27/2023 PCP: Celestia Rosaline SQUIBB, NP Referred by: Celestia Rosaline SQUIBB, NP  Subjective: Chief Complaint  Patient presents with   Lower Back - Pain   HPI: Michele Trevino is a 48 y.o. female who comes in today for evaluation of chronic, worsening and severe bilateral lower back pain radiating down both legs, right greater than left. Also reports paresthesias to bilateral legs. Pain ongoing for several years. Her pain worsens with prolonged standing and walking. She describes pain as sharp and stabbing sensation, currently rates as 4 out of 10. Some relief of pain with home exercise regimen, rest and use of medications. History of formal physical therapy for more pelvic pain. Recent lumbar MRI imaging shows severe multifactorial spinal stenosis at L4-L5 and right greater than left lateral recess narrowing. There is progressive degenerative anterolisthesis at L4-L5 measuring 6 mm. She was previously treated by Dr. Victory Gunnels, per patient he did recommend surgical intervention at that time, however she declined surgery at that time. She did undergo several lumbar epidural steroid injections at [facility], minimal relief of pain with these procedures. Patient denies focal weakness. No recent trauma or falls.       Review of Systems  Musculoskeletal:  Positive for back pain.  Neurological:  Positive for tingling. Negative for focal weakness and weakness.  All other systems reviewed and are negative.  Otherwise per HPI.  Assessment & Plan: Visit Diagnoses:    ICD-10-CM   1. Chronic bilateral low back pain with bilateral sciatica  M54.42    M54.41    G89.29     2. Lumbar radiculopathy  M54.16     3. Spinal stenosis of lumbar region with neurogenic claudication  M48.062        Plan: Findings:  Chronic, worsening and severe bilateral lower back pain  radiating down both legs, right greater than left. Paresthesias to bilateral lower extremities. Patient continues to have severe pain despite good conservative therapies such as home exercise regimen, rest and use of medications. I discussed recent lumbar MRI with her today using imaging and spine model. There is progressive degenerative anterolisthesis and severe multifactorial spinal canal stenosis at L4-L5. Patients clinical presentation and exam are consistent with neurogenic claudication as a result of spinal canal stenosis. We discussed treatment plan in detail today. She does not wish to continue with injection therapy at this time. I recommended referral either back to Dr. Gunnels or our spine surgeon Dr. Ozell Ada. She would like some time to speak with her husband about options. I encouraged her to contact our office with any questions. We are also happy to try injection should she change her mind. Her exam today is non focal, good strength noted to bilateral lower extremities.     Meds & Orders: No orders of the defined types were placed in this encounter.  No orders of the defined types were placed in this encounter.   Follow-up: Return if symptoms worsen or fail to improve.   Procedures: No procedures performed      Clinical History: CLINICAL DATA:  Low back pain, symptoms persist with > 6 wks treatment   Chronic low back pain with bilateral buttock and leg weakness and numbness for 8 years. No recent injury or prior relevant surgery.   EXAM: MRI LUMBAR SPINE WITHOUT CONTRAST   TECHNIQUE: Multiplanar, multisequence MR imaging of the lumbar spine was  performed. No intravenous contrast was administered.   COMPARISON:  Lumbar MRI 09/28/2019. Lumbar spine radiographs 03/04/2018.   FINDINGS: Segmentation: Conventional anatomy assumed, with the last open disc space designated L5-S1.Concordant with prior imaging.   Alignment: Progressive degenerative anterolisthesis at  L4-5, measuring 6 mm.   Vertebrae: No worrisome osseous lesion, acute fracture or pars defect. The visualized sacroiliac joints appear unremarkable.   Conus medullaris: Extends to the L1-2 level. The conus and cauda equina appear normal.   Paraspinal and other soft tissues: No significant paraspinal findings.   Disc levels:   Sagittal images demonstrate grossly stable disc bulging and endplate osteophytes at T10-11 and T11-12. No apparent cord deformity or high-grade foraminal narrowing.   The T12-L1 and L1-2 disc spaces appear normal.   L2-3: Mild loss of disc height with mildly progressive disc bulging and mild bilateral facet hypertrophy. No resulting spinal stenosis or significant foraminal narrowing.   L3-4: Stable mild loss of disc height with mild disc bulging, facet and ligamentous hypertrophy. Stable borderline spinal stenosis. No foraminal narrowing or nerve root impingement.   L4-5: Progressive facet and ligamentous hypertrophy with small bilateral facet joint effusions, accounting for the progressive anterolisthesis. There is increased loss of disc height with progressive disc bulging and uncovering. These factors contribute to severe multifactorial spinal stenosis, mildly progressive from previous examination. Lateral recess narrowing appears mildly progressive on the right. No significant change in mild to moderate foraminal narrowing bilaterally.   L5-S1: Preserved disc height with stable mild disc bulging, facet and ligamentous hypertrophy. Stable mild spinal stenosis and lateral recess narrowing. The foramina appear sufficiently patent.   IMPRESSION: 1. Compared with previous MRI from 09/28/2019, there is progressive spondylosis and anterolisthesis at L4-5 with resulting severe multifactorial spinal stenosis and right greater than left lateral recess narrowing. Mild to moderate foraminal narrowing bilaterally. 2. Stable mild multifactorial spinal  stenosis and lateral recess narrowing at L5-S1. 3. Mildly progressive disc bulging at L2-3 without resulting spinal stenosis or nerve root impingement. 4. No acute osseous findings.     Electronically Signed   By: Elsie Perone M.D.   On: 12/18/2023 11:04   She reports that she quit smoking about 20 years ago. Her smoking use included cigars. She has never used smokeless tobacco.  Recent Labs    03/08/23 1129  HGBA1C 5.7*    Objective:  VS:  HT:    WT:   BMI:     BP:   HR: bpm  TEMP: ( )  RESP:  Physical Exam Vitals and nursing note reviewed.  HENT:     Head: Normocephalic and atraumatic.     Right Ear: External ear normal.     Left Ear: External ear normal.     Nose: Nose normal.     Mouth/Throat:     Mouth: Mucous membranes are moist.  Eyes:     Extraocular Movements: Extraocular movements intact.  Cardiovascular:     Rate and Rhythm: Normal rate.     Pulses: Normal pulses.  Pulmonary:     Effort: Pulmonary effort is normal.  Abdominal:     General: Abdomen is flat. There is no distension.  Musculoskeletal:        General: Tenderness present.     Cervical back: Normal range of motion.     Comments: Patient rises from seated position to standing without difficulty. Pain noted with facet loading and lumbar extension. 5/5 strength noted with bilateral hip flexion, knee flexion/extension, ankle dorsiflexion/plantarflexion and EHL. No clonus noted  bilaterally. No pain upon palpation of greater trochanters. No pain with internal/external rotation of bilateral hips. Sensation intact bilaterally. Myofascial tenderness noted upon palpation upon palpation of bilateral thoracic and lumbar paraspinal regions. Negative slump test bilaterally. Ambulates without aid, gait steady.       Skin:    General: Skin is warm and dry.     Capillary Refill: Capillary refill takes less than 2 seconds.  Neurological:     General: No focal deficit present.     Mental Status: She is alert  and oriented to person, place, and time.  Psychiatric:        Mood and Affect: Mood normal.        Behavior: Behavior normal.     Ortho Exam  Imaging: No results found.  Past Medical/Family/Surgical/Social History: Medications & Allergies reviewed per EMR, new medications updated. Patient Active Problem List   Diagnosis Date Noted   History of colonic polyps 02/01/2023   Nodule of colon 11/23/2022   Esophageal dysphagia 11/23/2022   GERD (gastroesophageal reflux disease) 07/28/2019   History of COVID-19 07/02/2019   Anxiety 07/02/2019   Bronchiolitis 07/02/2019   Acute non-recurrent maxillary sinusitis 07/02/2019   Morbid obesity (HCC)    Mixed incontinence 03/21/2019   UTERINE FIBROID 04/19/2006   Past Medical History:  Diagnosis Date   Arthritis    Asthma    Carpal tunnel syndrome    Morbid obesity (HCC)    Spinal stenosis    Family History  Problem Relation Age of Onset   Lung cancer Mother    Diabetes Father    Hypertension Father    Throat cancer Maternal Aunt    Colon cancer Neg Hx    Esophageal cancer Neg Hx    Inflammatory bowel disease Neg Hx    Liver disease Neg Hx    Pancreatic cancer Neg Hx    Rectal cancer Neg Hx    Stomach cancer Neg Hx    Past Surgical History:  Procedure Laterality Date   CESAREAN SECTION  [phone]   x 3   COLONOSCOPY WITH PROPOFOL  N/A 02/01/2023   Procedure: COLONOSCOPY WITH PROPOFOL ;  Surgeon: Wilhelmenia Aloha Raddle., MD;  Location: [facility];  Service: Gastroenterology;  Laterality: N/A;   ESOPHAGOGASTRODUODENOSCOPY N/A 02/01/2023   Procedure: ESOPHAGOGASTRODUODENOSCOPY (EGD);  Surgeon: Wilhelmenia Aloha Raddle., MD;  Location: [facility];  Service: Gastroenterology;  Laterality: N/A;   EUS N/A 02/01/2023   Procedure: LOWER ENDOSCOPIC ULTRASOUND (EUS);  Surgeon: Wilhelmenia Aloha Raddle., MD;  Location: [facility];  Service: Gastroenterology;  Laterality: N/A;   POLYPECTOMY  02/01/2023   Procedure: POLYPECTOMY;   Surgeon: Wilhelmenia Aloha Raddle., MD;  Location: [facility];  Service: Gastroenterology;;   HARLEY DILATION N/A 02/01/2023   Procedure: HARLEY HODGKIN;  Surgeon: Wilhelmenia Aloha Raddle., MD;  Location: [facility];  Service: Gastroenterology;  Laterality: N/A;   TUBAL LIGATION     Social History   Occupational History    Comment: NA  Tobacco Use   Smoking status: Former    Types: Cigars    Quit date: 12/22/2003    Years since quitting: 20.0   Smokeless tobacco: Never   Tobacco comments:    Not an everyday smoker.   Vaping Use   Vaping status: Never Used  Substance and Sexual Activity   Alcohol use: Not Currently    Comment: socially    Drug use: No   Sexual activity: Yes    Birth control/protection: Surgical

## 2023-12-27 NOTE — Progress Notes (Signed)
 Pain Scale   Average Pain 0 Patient advising she suffers from lower back pain when she walks and stands for an extended period or time. Pain is constant when it starts.Patient is here for MRI review        +Driver, -BT, -Dye Allergies.

## 2024-01-13 ENCOUNTER — Emergency Department (HOSPITAL_COMMUNITY)
Admission: EM | Admit: 2024-01-13 | Discharge: 2024-01-14 | Disposition: A | Payer: Self-pay | Attending: Emergency Medicine | Admitting: Emergency Medicine

## 2024-01-13 ENCOUNTER — Other Ambulatory Visit: Payer: Self-pay

## 2024-01-13 ENCOUNTER — Encounter (HOSPITAL_COMMUNITY): Payer: Self-pay | Admitting: *Deleted

## 2024-01-13 DIAGNOSIS — Z91040 Latex allergy status: Secondary | ICD-10-CM | POA: Insufficient documentation

## 2024-01-13 DIAGNOSIS — M4802 Spinal stenosis, cervical region: Secondary | ICD-10-CM | POA: Insufficient documentation

## 2024-01-13 DIAGNOSIS — M62838 Other muscle spasm: Secondary | ICD-10-CM | POA: Insufficient documentation

## 2024-01-13 NOTE — ED Triage Notes (Signed)
 The pt is c/o rt neck and rt shoulder spasms intermittently for one month no known injury

## 2024-01-14 ENCOUNTER — Emergency Department (HOSPITAL_COMMUNITY): Payer: Self-pay

## 2024-01-14 LAB — CBC WITH DIFFERENTIAL/PLATELET
Abs Immature Granulocytes: 0.03 K/uL (ref 0.00–0.07)
Basophils Absolute: 0 K/uL (ref 0.0–0.1)
Basophils Relative: 0 %
Eosinophils Absolute: 0.3 K/uL (ref 0.0–0.5)
Eosinophils Relative: 3 %
HCT: 35.6 % — ABNORMAL LOW (ref 36.0–46.0)
Hemoglobin: 11.5 g/dL — ABNORMAL LOW (ref 12.0–15.0)
Immature Granulocytes: 0 %
Lymphocytes Relative: 48 %
Lymphs Abs: 4.8 K/uL — ABNORMAL HIGH (ref 0.7–4.0)
MCH: 27.6 pg (ref 26.0–34.0)
MCHC: 32.3 g/dL (ref 30.0–36.0)
MCV: 85.4 fL (ref 80.0–100.0)
Monocytes Absolute: 0.8 K/uL (ref 0.1–1.0)
Monocytes Relative: 8 %
Neutro Abs: 4.2 K/uL (ref 1.7–7.7)
Neutrophils Relative %: 41 %
Platelets: 444 K/uL — ABNORMAL HIGH (ref 150–400)
RBC: 4.17 MIL/uL (ref 3.87–5.11)
RDW: 14 % (ref 11.5–15.5)
WBC: 10.1 K/uL (ref 4.0–10.5)
nRBC: 0 % (ref 0.0–0.2)

## 2024-01-14 LAB — BASIC METABOLIC PANEL WITH GFR
Anion gap: 9 (ref 5–15)
BUN: 9 mg/dL (ref 6–20)
CO2: 28 mmol/L (ref 22–32)
Calcium: 9.5 mg/dL (ref 8.9–10.3)
Chloride: 101 mmol/L (ref 98–111)
Creatinine, Ser: 0.8 mg/dL (ref 0.44–1.00)
GFR, Estimated: >60 mL/min (ref >60)
Glucose, Bld: 87 mg/dL (ref 70–99)
Potassium: 4.1 mmol/L (ref 3.5–5.1)
Sodium: 138 mmol/L (ref 135–145)

## 2024-01-14 MED ORDER — KETOROLAC TROMETHAMINE 15 MG/ML IJ SOLN
15.0000 mg | Freq: Once | INTRAMUSCULAR | Status: AC
  Administered 2024-01-14: 15 mg via INTRAVENOUS
  Filled 2024-01-14: qty 1

## 2024-01-14 MED ORDER — PREDNISONE 10 MG (21) PO TBPK: ORAL_TABLET | Freq: Every day | ORAL | 0 refills | Status: DC

## 2024-01-14 MED ORDER — DIAZEPAM 5 MG/ML IJ SOLN
2.5000 mg | Freq: Once | INTRAMUSCULAR | Status: AC | PRN
Start: 2024-01-14 — End: 2024-01-14
  Administered 2024-01-14: 2.5 mg via INTRAVENOUS
  Filled 2024-01-14: qty 2

## 2024-01-14 MED ORDER — OXYCODONE-ACETAMINOPHEN 5-325 MG PO TABS
1.0000 | ORAL_TABLET | Freq: Once | ORAL | Status: AC
  Administered 2024-01-14: 1 via ORAL
  Filled 2024-01-14: qty 1

## 2024-01-14 MED ORDER — PREDNISONE 20 MG PO TABS
60.0000 mg | ORAL_TABLET | Freq: Once | ORAL | Status: AC
  Administered 2024-01-14: 60 mg via ORAL
  Filled 2024-01-14: qty 3

## 2024-01-14 MED ORDER — FENTANYL CITRATE (PF) 50 MCG/ML IJ SOSY
50.0000 ug | PREFILLED_SYRINGE | Freq: Once | INTRAMUSCULAR | Status: AC
  Administered 2024-01-14: 50 ug via INTRAVENOUS
  Filled 2024-01-14: qty 1

## 2024-01-14 MED ORDER — LIDOCAINE 5 % EX PTCH
2.0000 | MEDICATED_PATCH | CUTANEOUS | Status: DC
  Administered 2024-01-14: 2 via TRANSDERMAL
  Filled 2024-01-14: qty 2

## 2024-01-14 MED ORDER — METHOCARBAMOL 500 MG PO TABS: 500.0000 mg | ORAL_TABLET | Freq: Three times a day (TID) | ORAL | 0 refills | Status: DC | PRN

## 2024-01-14 NOTE — ED Notes (Signed)
 Patient transported to MRI

## 2024-01-14 NOTE — ED Notes (Signed)
 Pt back from MRI

## 2024-01-14 NOTE — ED Provider Notes (Signed)
 [place] [facility]PARTMENT AT [facility] Provider Note   CSN: [id number] Arrival date & time: 01/13/24  2316     Patient presents with: Spasms   Michele Trevino is a 48 y.o. female with known lumbar spinal stenosis who presents with concern for 3 days of shoulder spasm and headaches R>L, intermittently relieved with heat at home.  Patient has not been taking Tylenol  or ibuprofen  she states the stomach hurt.  She presents at this time with report for severe pain and bilateral numbness and tingling in the hands without weakness.  Patient tearful at time of my arrival to the bedside   HPI     Prior to Admission medications   Medication Sig Start Date End Date Taking? Authorizing Provider  albuterol  (PROVENTIL ) (2.5 MG/3ML) 0.083% nebulizer solution Take 3 mLs (2.5 mg total) by nebulization every 6 (six) hours as needed for wheezing or shortness of breath. 07/02/19 11/22/22  Brien Belvie BRAVO, MD  albuterol  (VENTOLIN  HFA) [phone] Base) MCG/ACT inhaler Inhale 1-2 puffs into the lungs every 6 (six) hours as needed for wheezing or shortness of breath. 05/18/20   Celestia Rosaline SQUIBB, NP  AMBULATORY NON FORMULARY MEDICATION [place] 1 capsule by mouth daily    [provider]  AMBULATORY NON FORMULARY MEDICATION URO vaginal probiotic 1 capsule by mouth daily    [provider]  cetirizine -pseudoephedrine  (ZYRTEC -D ALLERGY & CONGESTION) 5-120 MG tablet Take 1 tablet by mouth 2 (two) times daily. 09/14/20   Celestia Rosaline SQUIBB, NP  Cholecalciferol (VITAMIN D -3 PO) Take 1 capsule by mouth daily.    [provider]  COLLAGEN PO Take 1 capsule by mouth daily.    [provider]  Cyanocobalamin (VITAMIN B 12 PO) Take 1 tablet by mouth daily.    [provider]  cyclobenzaprine  (FLEXERIL ) 10 MG tablet Take 1 tablet (10 mg total) by mouth 3 (three) times daily as needed for muscle spasms. Patient not taking: Reported on 11/25/2023  10/25/23   Mayers, Cari S, PA-C  ferrous sulfate 325 (65 FE) MG EC tablet Take 325 mg by mouth daily.    [provider]  fluticasone  (FLONASE ) 50 MCG/ACT nasal spray Place 2 sprays into both nostrils daily. 09/14/20   Celestia Rosaline SQUIBB, NP  hydrocortisone  (ANUSOL -HC) 25 MG suppository Place 1 suppository (25 mg total) rectally at bedtime. Nightly for 4 nights in a row use as needed 02/01/23 02/01/24  Mansouraty, Aloha Raddle., MD  meloxicam  (MOBIC ) 15 MG tablet Take 1 tablet by mouth daily. Patient not taking: Reported on 03/08/2023 12/10/22   [provider]  methylPREDNISolone  (MEDROL  DOSEPAK) 4 MG TBPK tablet Use per instructions on package Patient not taking: Reported on 11/25/2023 10/25/23   Mayers, Cari S, PA-C  Multiple Vitamin (MULTIVITAMIN) capsule Take 1 capsule by mouth daily.    [provider]  nitrofurantoin , macrocrystal-monohydrate, (MACROBID ) 100 MG capsule Take 1 capsule (100 mg total) by mouth 2 (two) times daily. Patient not taking: Reported on 11/25/2023 08/03/23   Teresa Almarie DELENA, PA-C  sucralfate  (CARAFATE ) 1 GM/10ML suspension Take 10 mLs (1 g total) by mouth 2 (two) times daily. 02/01/23 02/01/24  Mansouraty, Aloha Raddle., MD  Vitamin D , Ergocalciferol , (DRISDOL) 1.25 MG (50000 UNIT) CAPS capsule Take 1 capsule every week by oral route for 90 days.    [provider]    Allergies: Latex    Review of Systems  Musculoskeletal:  Positive for back pain, myalgias and neck pain.  Updated Vital Signs BP (!) 153/97 (BP Location: Right Arm)   Pulse 71   Temp 97.7 F (36.5 C) (Oral)   Resp 16   Ht 5' 1 (1.549 m)   Wt 93 kg   LMP 08/21/2022   SpO2 100%   BMI 38.74 kg/m   Physical Exam Vitals and nursing note reviewed.  Constitutional:      Appearance: She is obese. She is not ill-appearing or toxic-appearing.  HENT:     Head: Normocephalic and atraumatic.     Mouth/Throat:     Mouth: Mucous membranes are moist.     Pharynx: No  oropharyngeal exudate or posterior oropharyngeal erythema.  Eyes:     General:        Right eye: No discharge.        Left eye: No discharge.     Conjunctiva/sclera: Conjunctivae normal.  Cardiovascular:     Rate and Rhythm: Normal rate and regular rhythm.     Pulses: Normal pulses.     Heart sounds: Normal heart sounds. No murmur heard. Pulmonary:     Effort: Pulmonary effort is normal. No respiratory distress.     Breath sounds: Normal breath sounds. No wheezing or rales.  Abdominal:     General: Bowel sounds are normal. There is no distension.     Palpations: Abdomen is soft.     Tenderness: There is no abdominal tenderness. There is no guarding or rebound.  Musculoskeletal:        General: No deformity.     Cervical back: Neck supple. Spasms and tenderness present. No bony tenderness.     Thoracic back: Spasms and tenderness present. No bony tenderness.     Lumbar back: Spasms and tenderness present. No bony tenderness.     Comments: Spasm and tenderness to palpation to bilateral trapezius and paraspinous musculature cervical spine, R>L  Skin:    General: Skin is warm and dry.  Neurological:     Mental Status: She is alert. Mental status is at baseline.     GCS: GCS eye subscore is 4. GCS verbal subscore is 5. GCS motor subscore is 6.     Sensory: Sensation is intact.     Motor: Motor function is intact.     Coordination: Coordination is intact.     Gait: Gait is intact.  Psychiatric:        Mood and Affect: Mood normal.     (all labs ordered are listed, but only abnormal results are displayed) Labs Reviewed  CBC WITH DIFFERENTIAL/PLATELET - Abnormal; Notable for the following components:      Result Value   Hemoglobin 11.5 (*)    HCT 35.6 (*)    Platelets 444 (*)    Lymphs Abs 4.8 (*)    All other components within normal limits  BASIC METABOLIC PANEL WITH GFR    EKG: None  Radiology: MR Cervical Spine Wo Contrast Result Date: 01/14/2024 EXAM: MRI CERVICAL  SPINE WITHOUT CONTRAST 01/14/2024 03:17:00 AM TECHNIQUE: Multiplanar multisequence MRI of the cervical spine was performed. COMPARISON: None available. CLINICAL HISTORY: Neck trauma, focal neuro deficit or paresthesia (Age 46-64y). FINDINGS: BONES AND ALIGNMENT: Straightening of normal cervical lordosis. Normal vertebral body heights. Bone marrow signal is unremarkable. SPINAL CORD: Normal spinal cord size. No abnormal spinal cord signal. SOFT TISSUES: No paraspinal mass. C2-C3: Small central disc protrusion. No central spinal canal or neural foraminal stenosis. C3-C4: Small disc bulge with no spinal canal or neural foraminal stenosis. C4-C5: Bilateral uncovertebral osteophytes  causing moderate right and severe left neural foraminal stenosis. Mild spinal canal stenosis. C5-C6: Small disc osteophyte complex causing moderate spinal canal stenosis and severe bilateral neural foraminal stenosis. C6-C7: Small central disc protrusion with mild spinal canal stenosis. No neural foraminal stenosis. C7-T1: No significant disc herniation. No spinal canal stenosis or neural foraminal narrowing. IMPRESSION: 1. C5-6: Small disc osteophyte complex causing moderate spinal canal stenosis and severe bilateral neural foraminal stenosis. 2. C4-5: Bilateral uncovertebral osteophytes causing moderate right and severe left neural foraminal stenosis, and mild spinal canal stenosis. 3. C6-7: Small central disc protrusion with mild spinal canal stenosis, no neural foraminal stenosis. Electronically signed by: Franky Stanford MD 01/14/2024 03:30 AM EST RP Workstation: [id number]     Procedures   Medications Ordered in the ED  lidocaine  (LIDODERM ) 5 % 2 patch (2 patches Transdermal Patch Applied 01/14/24 0233)  predniSONE  (DELTASONE ) tablet 60 mg (has no administration in time range)  ketorolac  (TORADOL ) 15 MG/ML injection 15 mg (15 mg Intravenous Given 01/14/24 0233)  fentaNYL  (SUBLIMAZE ) injection 50 mcg (50 mcg Intravenous Given  01/14/24 0232)  diazepam  (VALIUM ) injection 2.5 mg (2.5 mg Intravenous Given 01/14/24 0245)                                    Medical Decision Making 48 year old female presents with concern for bilateral arm paresthesias and severe neck and shoulder pain.  CardiopulmonaryHypertensive on intake, vital signs otherwise reassuring [place] overnight.  Neurologically patient is intact though she does have exquisite muscle tenderness to palpation in the bilateral trapezius right greater than left as well as associated muscle spasm.  DDx includes but is not limited to muscle spasm, cervical radiculopathy, spinal cord injury.  Amount and/or Complexity of Data Reviewed Labs: ordered.    Details:  CBC unremarkable, BMP unremarkable, Radiology: ordered.    Details: MRI of the cervical spine revealed C4-6 neural foraminal stenosis moderate to severe, however only mild to moderate spinal canal stenosis in the cervical spine.  Risk Prescription drug management.       Steroids and supportive care recommended for patient's symptomatology, clinical concern for emergent underlying neurologic condition that would warrant further ED workup or inpatient management is exceedingly low.  Kenneth  voiced understanding of her medical evaluation and treatment plan. Each of their questions answered to their expressed satisfaction.  Return precautions were given.  Patient is well-appearing, stable, and was discharged in good condition.  This chart was dictated using voice recognition software, Dragon. Despite the best efforts of this provider to proofread and correct errors, errors may still occur which can change documentation meaning.      Final diagnoses:  None    ED Discharge Orders     None          Bobette Pleasant JONELLE DEVONNA 01/14/24 0654    Darra Fonda MATSU, MD 01/14/24 [phone]

## 2024-01-14 NOTE — Discharge Instructions (Addendum)
 You were seen in the ER today for your neck pain and muscle spasms.  Your MRI showed stenosis in your neck for which you should follow-up with a spine specialist below.  Please take the prescribed course of steroids, may also use lidocaine  patches, topical pain relief such as Biofreeze/IcyHot, heating pads, and over-the-counter medication such as Tylenol  ibuprofen  as needed for discomfort.  Return to the ER with any severe symptoms.

## 2024-01-28 ENCOUNTER — Emergency Department (HOSPITAL_BASED_OUTPATIENT_CLINIC_OR_DEPARTMENT_OTHER): Payer: Self-pay

## 2024-01-28 ENCOUNTER — Encounter (HOSPITAL_BASED_OUTPATIENT_CLINIC_OR_DEPARTMENT_OTHER): Payer: Self-pay

## 2024-01-28 ENCOUNTER — Emergency Department (HOSPITAL_BASED_OUTPATIENT_CLINIC_OR_DEPARTMENT_OTHER)
Admission: EM | Admit: 2024-01-28 | Discharge: 2024-01-28 | Disposition: A | Discharge status: Home / Self Care | Payer: Self-pay | Attending: Emergency Medicine | Admitting: Emergency Medicine

## 2024-01-28 DIAGNOSIS — R10A1 Flank pain, right side: Secondary | ICD-10-CM

## 2024-01-28 LAB — URINALYSIS, W/ REFLEX TO CULTURE (INFECTION SUSPECTED)
Bacteria, UA: NONE SEEN
Bilirubin Urine: NEGATIVE
Glucose, UA: NEGATIVE mg/dL
Hgb urine dipstick: NEGATIVE
Ketones, ur: NEGATIVE mg/dL
Leukocytes,Ua: NEGATIVE
Nitrite: NEGATIVE
Protein, ur: NEGATIVE mg/dL
Specific Gravity, Urine: 1.005 (ref 1.005–1.030)
pH: 7 (ref 5.0–8.0)

## 2024-01-28 LAB — CBC
HCT: 35.8 % — ABNORMAL LOW (ref 36.0–46.0)
Hemoglobin: 11.5 g/dL — ABNORMAL LOW (ref 12.0–15.0)
MCH: 27.4 pg (ref 26.0–34.0)
MCHC: 32.1 g/dL (ref 30.0–36.0)
MCV: 85.2 fL (ref 80.0–100.0)
Platelets: 416 K/uL — ABNORMAL HIGH (ref 150–400)
RBC: 4.2 MIL/uL (ref 3.87–5.11)
RDW: 14.4 % (ref 11.5–15.5)
WBC: 11.7 K/uL — ABNORMAL HIGH (ref 4.0–10.5)
nRBC: 0 % (ref 0.0–0.2)

## 2024-01-28 LAB — HEPATIC FUNCTION PANEL
ALT: 37 U/L (ref 0–44)
AST: 29 U/L (ref 15–41)
Albumin: 4.5 g/dL (ref 3.5–5.0)
Alkaline Phosphatase: 75 U/L (ref 38–126)
Bilirubin, Direct: <0.1 mg/dL (ref 0.0–0.2)
Total Bilirubin: 0.2 mg/dL (ref 0.0–1.2)
Total Protein: 8.3 g/dL — ABNORMAL HIGH (ref 6.5–8.1)

## 2024-01-28 LAB — BASIC METABOLIC PANEL WITH GFR
Anion gap: 11 (ref 5–15)
BUN: 6 mg/dL (ref 6–20)
CO2: 31 mmol/L (ref 22–32)
Calcium: 10.1 mg/dL (ref 8.9–10.3)
Chloride: 102 mmol/L (ref 98–111)
Creatinine, Ser: 0.94 mg/dL (ref 0.44–1.00)
GFR, Estimated: >60 mL/min (ref >60)
Glucose, Bld: 103 mg/dL — ABNORMAL HIGH (ref 70–99)
Potassium: 4 mmol/L (ref 3.5–5.1)
Sodium: 144 mmol/L (ref 135–145)

## 2024-01-28 LAB — TROPONIN T, HIGH SENSITIVITY: Troponin T High Sensitivity: <15 ng/L (ref 0–19)

## 2024-01-28 LAB — PREGNANCY, URINE: Preg Test, Ur: NEGATIVE

## 2024-01-28 MED ORDER — HYDROMORPHONE HCL 1 MG/ML IJ SOLN
1.0000 mg | Freq: Once | INTRAMUSCULAR | Status: AC
  Administered 2024-01-28: 1 mg via INTRAVENOUS
  Filled 2024-01-28: qty 1

## 2024-01-28 MED ORDER — PANTOPRAZOLE SODIUM 40 MG IV SOLR
40.0000 mg | Freq: Once | INTRAVENOUS | Status: AC
  Administered 2024-01-28: 40 mg via INTRAVENOUS
  Filled 2024-01-28: qty 10

## 2024-01-28 MED ORDER — ONDANSETRON HCL 4 MG/2ML IJ SOLN
4.0000 mg | Freq: Once | INTRAMUSCULAR | Status: AC
  Administered 2024-01-28: 4 mg via INTRAVENOUS
  Filled 2024-01-28: qty 2

## 2024-01-28 MED ORDER — OXYCODONE-ACETAMINOPHEN 5-325 MG PO TABS: 2.0000 | ORAL_TABLET | ORAL | 0 refills | Status: DC | PRN

## 2024-01-28 MED ORDER — DIAZEPAM 5 MG PO TABS: 2.5000 mg | ORAL_TABLET | Freq: Four times a day (QID) | ORAL | 0 refills | Status: DC | PRN

## 2024-01-28 MED ORDER — FAMOTIDINE IN NACL 20-0.9 MG/50ML-% IV SOLN
20.0000 mg | Freq: Once | INTRAVENOUS | Status: AC
  Administered 2024-01-28: 20 mg via INTRAVENOUS
  Filled 2024-01-28: qty 50

## 2024-01-28 MED ORDER — IOHEXOL 350 MG/ML SOLN
100.0000 mL | Freq: Once | INTRAVENOUS | Status: AC | PRN
  Administered 2024-01-28: 100 mL via INTRAVENOUS

## 2024-01-28 MED ORDER — PANTOPRAZOLE SODIUM 20 MG PO TBEC: 20.0000 mg | DELAYED_RELEASE_TABLET | Freq: Every day | ORAL | 0 refills | Status: DC

## 2024-01-28 NOTE — ED Triage Notes (Signed)
 Pt to exam 15 c/o right sided flank pain sudden onset 10/10 ache in nature x 24 hours. Pt states she has had increased frequency of urination. PT denies fever SOB CP at this time. VSS NAD Pt on room air.

## 2024-01-28 NOTE — ED Notes (Signed)
 Discharge instructions and follow up care reviewed and explained to pt and family who verbalized understanding with no further questions on d/c.

## 2024-01-29 ENCOUNTER — Ambulatory Visit (HOSPITAL_COMMUNITY)
Admission: EM | Admit: 2024-01-29 | Discharge: 2024-01-29 | Disposition: A | Discharge status: Home / Self Care | Payer: Self-pay

## 2024-01-29 ENCOUNTER — Encounter (HOSPITAL_COMMUNITY): Payer: Self-pay | Admitting: *Deleted

## 2024-01-29 DIAGNOSIS — M62830 Muscle spasm of back: Secondary | ICD-10-CM

## 2024-01-29 MED ORDER — METHOCARBAMOL 750 MG PO TABS: 750.0000 mg | ORAL_TABLET | Freq: Four times a day (QID) | ORAL | 0 refills | Status: DC | PRN

## 2024-01-29 MED ORDER — KETOROLAC TROMETHAMINE 30 MG/ML IJ SOLN
30.0000 mg | Freq: Once | INTRAMUSCULAR | Status: AC
  Administered 2024-01-29: 30 mg via INTRAMUSCULAR

## 2024-01-29 MED ORDER — KETOROLAC TROMETHAMINE 30 MG/ML IJ SOLN
INTRAMUSCULAR | Status: AC
  Filled 2024-01-29: qty 1

## 2024-01-29 MED ORDER — METHYLPREDNISOLONE 4 MG PO TBPK: ORAL_TABLET | ORAL | 0 refills | Status: DC

## 2024-01-29 NOTE — ED Triage Notes (Signed)
 Pt states she was seen at ED yesterday and had a lot of tests ran, no answers. Pt states maybe its pain in her vagina. Pt states she took some prednisone  and robaxin  which she had at home from a a previous ED visit not the last one. She states she hasn't taken any meds ED gave her yesterday.

## 2024-01-29 NOTE — ED Provider Notes (Signed)
 [facility]    CSN: [id number] Arrival date & time: 01/29/24  9092      History   Chief Complaint Chief Complaint  Patient presents with   Flank Pain    HPI Michele Trevino is a 48 y.o. female.   Michele Trevino is a 48 year old female with past medical history of lumbar spinal stenosis.  She presents today with complaint of right sided low back/flank pain that began 2 days ago.  She reports that it was mild at onset but has since progressed to be a constant dull/sharp pain that is exacerbated with twisting of the spine, bending over, and right leg movements.  She also reports that changing positions is difficult and painful.  She presented to the ER yesterday where right upper quadrant ultrasound, abdominal CT, CT angio, and abdominal x-ray were all negative.  Urinalysis was also unremarkable.  In their note, ER ruled out kidney stone, dissection, PE, pneumonia, pneumothorax.  She was given Dilaudid , Zofran , and Pepcid  in the ED.  Admission was offered per patient and ED note, but patient declined.  She was discharged home with her husband. She reports that the pain abated in the ED with administration of Dilaudid , but it did cause one episode of emesis.   She comes in today because she feels like she did not get answers in the emergency room and is concerned about what is causing her pain as she has an upcoming cruise. She states that she is well acquainted with the quality of pain related to her spinal stenosis/sciatica, but this feels different.  However, she does note that she recently started exercising, and after beginning exercising she has since had upper back pain for which she was also seen in the emergency room and treated with prednisone  and methocarbamol  500mg  3 times daily.  This has since resolved.  She denies any injury prior to onset of current pain, but reports that she has been doing stretches.  This morning she did take leftover prednisone  from her previous ER  admission as well as one 500 mg Robaxin  pill.   She denies pain that radiates to the legs or abdomen.  She denies skin changes.  She denies numbness, tingling, weakness of the lower extremities, saddle paresthesia, bladder or bowel incontinence.  She reports that she has had some thick white vaginal discharge, but no discolored or malodorous discharge.  No concerns for STIs.  Denies pelvic pain and vaginal bleeding. Is post menopausal   The history is provided by the patient.  Flank Pain This is a new problem. The current episode started 2 days ago. The problem occurs constantly. The problem has been gradually worsening. Pertinent negatives include no chest pain, no abdominal pain, no headaches and no shortness of breath. The symptoms are aggravated by bending, twisting and exertion. Nothing relieves the symptoms.    Past Medical History:  Diagnosis Date   Arthritis    Asthma    Carpal tunnel syndrome    Morbid obesity (HCC)    Spinal stenosis     Patient Active Problem List   Diagnosis Date Noted   History of colonic polyps 02/01/2023   Nodule of colon 11/23/2022   Esophageal dysphagia 11/23/2022   GERD (gastroesophageal reflux disease) 07/28/2019   History of COVID-19 07/02/2019   Anxiety 07/02/2019   Bronchiolitis 07/02/2019   Acute non-recurrent maxillary sinusitis 07/02/2019   Morbid obesity (HCC)    Mixed incontinence 03/21/2019   UTERINE FIBROID 04/19/2006    Past Surgical History:  Procedure Laterality Date   CESAREAN SECTION  [phone]   x 3   COLONOSCOPY WITH PROPOFOL  N/A 02/01/2023   Procedure: COLONOSCOPY WITH PROPOFOL ;  Surgeon: Mansouraty, Aloha Raddle., MD;  Location: [facility];  Service: Gastroenterology;  Laterality: N/A;   ESOPHAGOGASTRODUODENOSCOPY N/A 02/01/2023   Procedure: ESOPHAGOGASTRODUODENOSCOPY (EGD);  Surgeon: Wilhelmenia Aloha Raddle., MD;  Location: [facility];  Service: Gastroenterology;  Laterality: N/A;   EUS N/A 02/01/2023   Procedure:  LOWER ENDOSCOPIC ULTRASOUND (EUS);  Surgeon: Wilhelmenia Aloha Raddle., MD;  Location: [facility];  Service: Gastroenterology;  Laterality: N/A;   POLYPECTOMY  02/01/2023   Procedure: POLYPECTOMY;  Surgeon: Wilhelmenia Aloha Raddle., MD;  Location: [facility];  Service: Gastroenterology;;   HARLEY DILATION N/A 02/01/2023   Procedure: HARLEY HODGKIN;  Surgeon: Wilhelmenia Aloha Raddle., MD;  Location: [facility];  Service: Gastroenterology;  Laterality: N/A;   TUBAL LIGATION      OB History     Gravida  8   Para  4   Term  4   Preterm      AB  4   Living  4      SAB      IAB      Ectopic      Multiple      Live Births  4            Home Medications    Prior to Admission medications   Medication Sig Start Date End Date Taking? Authorizing Provider  methocarbamol  (ROBAXIN ) 750 MG tablet Take 1 tablet (750 mg total) by mouth 4 (four) times daily as needed for muscle spasms. 01/29/24  Yes Leatrice Vernell HERO, NP  methylPREDNISolone  (MEDROL  DOSEPAK) 4 MG TBPK tablet Day 1: take 6 tabs in the morning; Day 2: take 5 tabs in the morning, decreasing by one pill each day until complete 01/29/24  Yes Leatrice Vernell HERO, NP  albuterol  (PROVENTIL ) (2.5 MG/3ML) 0.083% nebulizer solution Take 3 mLs (2.5 mg total) by nebulization every 6 (six) hours as needed for wheezing or shortness of breath. 07/02/19 11/22/22  Brien Belvie BRAVO, MD  albuterol  (VENTOLIN  HFA) 108 [phone] Base) MCG/ACT inhaler Inhale 1-2 puffs into the lungs every 6 (six) hours as needed for wheezing or shortness of breath. 05/18/20   Celestia Rosaline SQUIBB, NP  AMBULATORY NON FORMULARY MEDICATION [employer] 1 capsule by mouth daily    [provider]  AMBULATORY NON FORMULARY MEDICATION URO vaginal probiotic 1 capsule by mouth daily    [provider]  cetirizine -pseudoephedrine  (ZYRTEC -D ALLERGY & CONGESTION) 5-120 MG tablet Take 1 tablet by mouth 2 (two) times daily. 09/14/20   Celestia Rosaline SQUIBB, NP   Cholecalciferol (VITAMIN D -3 PO) Take 1 capsule by mouth daily.    [provider]  COLLAGEN PO Take 1 capsule by mouth daily.    [provider]  Cyanocobalamin (VITAMIN B 12 PO) Take 1 tablet by mouth daily.    [provider]  diazepam  (VALIUM ) 5 MG tablet Take 0.5 tablets (2.5 mg total) by mouth every 6 (six) hours as needed for muscle spasms (spasms). 01/28/24   Mesner, Jason, MD  ferrous sulfate 325 (65 FE) MG EC tablet Take 325 mg by mouth daily.    [provider]  fluticasone  (FLONASE ) 50 MCG/ACT nasal spray Place 2 sprays into both nostrils daily. 09/14/20   Celestia Rosaline SQUIBB, NP  hydrocortisone  (ANUSOL -HC) 25 MG suppository Place 1 suppository (25 mg total) rectally at bedtime. Nightly for 4 nights in a row use  as needed 02/01/23 02/01/24  Mansouraty, Gabriel Jr., MD  Multiple Vitamin (MULTIVITAMIN) capsule Take 1 capsule by mouth daily.    [provider]  oxyCODONE -acetaminophen  (PERCOCET) 5-325 MG tablet Take 2 tablets by mouth every 4 (four) hours as needed. 01/28/24   Mesner, Selinda, MD  pantoprazole  (PROTONIX ) 20 MG tablet Take 1 tablet (20 mg total) by mouth daily for 7 days. 01/28/24 02/04/24  Mesner, Selinda, MD  sucralfate  (CARAFATE ) 1 GM/10ML suspension Take 10 mLs (1 g total) by mouth 2 (two) times daily. 02/01/23 02/01/24  Mansouraty, Aloha Raddle., MD  Vitamin D , Ergocalciferol , (DRISDOL) 1.25 MG (50000 UNIT) CAPS capsule Take 1 capsule every week by oral route for 90 days.    [provider]    Family History Family History  Problem Relation Age of Onset   Lung cancer Mother    Diabetes Father    Hypertension Father    Throat cancer Maternal Aunt    Colon cancer Neg Hx    Esophageal cancer Neg Hx    Inflammatory bowel disease Neg Hx    Liver disease Neg Hx    Pancreatic cancer Neg Hx    Rectal cancer Neg Hx    Stomach cancer Neg Hx     Social History Social History   Tobacco Use   Smoking status: Former     Types: Cigars    Quit date: 12/22/2003    Years since quitting: 20.1   Smokeless tobacco: Never   Tobacco comments:    Not an everyday smoker.   Vaping Use   Vaping status: Never Used  Substance Use Topics   Alcohol use: Not Currently    Comment: socially    Drug use: No     Allergies   Latex   Review of Systems Review of Systems  Constitutional: Negative.   Respiratory:  Negative for shortness of breath.   Cardiovascular:  Negative for chest pain.  Gastrointestinal:  Negative for abdominal pain.  Genitourinary:  Positive for flank pain. Negative for difficulty urinating, dysuria, frequency, menstrual problem, pelvic pain, urgency, vaginal bleeding and vaginal discharge.  Musculoskeletal:  Positive for back pain. Negative for gait problem, joint swelling, myalgias, neck pain and neck stiffness.  Skin: Negative.   Neurological:  Negative for weakness, light-headedness, numbness and headaches.     Physical Exam Triage Vital Signs ED Triage Vitals  Encounter Vitals Group     BP 01/29/24 0919 (!) 148/87     Girls Systolic BP Percentile --      Girls Diastolic BP Percentile --      Boys Systolic BP Percentile --      Boys Diastolic BP Percentile --      Pulse Rate 01/29/24 0919 79     Resp 01/29/24 0919 16     Temp 01/29/24 0919 97.9 F (36.6 C)     Temp Source 01/29/24 0919 Oral     SpO2 01/29/24 0919 95 %     Weight --      Height --      Head Circumference --      Peak Flow --      Pain Score 01/29/24 0917 8     Pain Loc --      Pain Education --      Exclude from Growth Chart --    No data found.  Updated Vital Signs BP (!) 148/87 (BP Location: Left Arm)   Pulse 79   Temp 97.9 F (36.6 C) (Oral)   Resp  16   LMP 08/21/2022   SpO2 95%   Visual Acuity Right Eye Distance:   Left Eye Distance:   Bilateral Distance:    Right Eye Near:   Left Eye Near:    Bilateral Near:     Physical Exam Vitals and nursing note reviewed.  Constitutional:       General: She is not in acute distress.    Appearance: Normal appearance. She is normal weight. She is not toxic-appearing.  Eyes:     Conjunctiva/sclera: Conjunctivae normal.  Cardiovascular:     Rate and Rhythm: Normal rate and regular rhythm.     Heart sounds: Normal heart sounds.  Pulmonary:     Effort: Pulmonary effort is normal.     Breath sounds: Normal breath sounds and air entry.  Abdominal:     General: Abdomen is flat. Bowel sounds are normal.     Palpations: Abdomen is soft.     Tenderness: There is no abdominal tenderness. There is no right CVA tenderness, left CVA tenderness, guarding or rebound. Negative signs include McBurney's sign.     Hernia: No hernia is present.  Musculoskeletal:     Cervical back: Normal.     Thoracic back: Spasms and tenderness present. No bony tenderness.     Lumbar back: Spasms and tenderness (Tenderness to light palpation of right sided paraspinal muscles from CVA to buttock) present. No swelling or bony tenderness. Decreased range of motion (Guarded due to pain). Negative right straight leg raise test and negative left straight leg raise test.  Lymphadenopathy:     Cervical:     Right cervical: No posterior cervical adenopathy.    Left cervical: No posterior cervical adenopathy.  Skin:    General: Skin is warm and dry.  Neurological:     Mental Status: She is alert and oriented to person, place, and time.     Cranial Nerves: Cranial nerves 2-12 are intact.     Sensory: Sensation is intact.     Motor: Motor function is intact.     Gait: Gait abnormal (stiff, slow, guarded).  Psychiatric:        Mood and Affect: Mood normal.        Behavior: Behavior normal.      UC Treatments / Results  Labs (all labs ordered are listed, but only abnormal results are displayed) Labs Reviewed - No data to display  EKG   Radiology US  Abdomen Limited RUQ (LIVER/GB) Result Date: 01/28/2024 CLINICAL DATA:  Right upper quadrant pain. EXAM: ULTRASOUND  ABDOMEN LIMITED RIGHT UPPER QUADRANT COMPARISON:  CT scan earlier same day FINDINGS: Gallbladder: No gallstones or wall thickening visualized. No sonographic Murphy sign noted by sonographer. Common bile duct: Diameter: Liver: No focal lesion identified. Within normal limits in parenchymal echogenicity. Portal vein is patent on color Doppler imaging with normal direction of blood flow towards the liver. Other: None. IMPRESSION: Unremarkable right upper quadrant ultrasound. Electronically Signed   By: Camellia Candle M.D.   On: 01/28/2024 06:22   CT ABDOMEN PELVIS W CONTRAST Result Date: 01/28/2024 EXAM: CT ABDOMEN AND PELVIS WITH CONTRAST 01/28/2024 04:51:43 AM TECHNIQUE: CT of the abdomen and pelvis was performed with the administration of 100 mL of iohexol  (OMNIPAQUE ) 350 MG/ML injection. Multiplanar reformatted images are provided for review. Automated exposure control, iterative reconstruction, and/or weight-based adjustment of the mA/kV was utilized to reduce the radiation dose to as low as reasonably achievable. COMPARISON: 06/10/2020 and 04/12/2020. CLINICAL HISTORY: Abdominal pain, acute, nonlocalized. FINDINGS: LOWER CHEST:  Please see the report for the chest CTA performed at the same time and dictated separately. LIVER: Focal low-density in the liver parenchyma adjacent to the falciform ligament is in a characteristic location for perfusion anomaly or focal fatty deposition. GALLBLADDER AND BILE DUCTS: Gallbladder is unremarkable. No biliary ductal dilatation. SPLEEN: No acute abnormality. PANCREAS: No acute abnormality. ADRENAL GLANDS: No acute abnormality. KIDNEYS, URETERS AND BLADDER: No stones in the kidneys or ureters. No hydronephrosis. No perinephric or periureteral stranding. Urinary bladder is unremarkable. GI AND BOWEL: Stomach demonstrates no acute abnormality. The terminal ileum has normal imaging features. The appendix is unremarkable. There is no bowel obstruction. PERITONEUM AND  RETROPERITONEUM: No ascites. No free air. VASCULATURE: Aorta is normal in caliber. LYMPH NODES: No lymphadenopathy. REPRODUCTIVE ORGANS: No acute abnormality. BONES AND SOFT TISSUES: No acute osseous abnormality. No focal soft tissue abnormality. IMPRESSION: 1. No acute abdominopelvic pathology identified. Electronically signed by: Camellia Candle MD 01/28/2024 05:23 AM EST RP Workstation: [id number]   CT Angio Chest PE W and/or Wo Contrast Result Date: 01/28/2024 EXAM: CTA of the Chest with contrast for PE 01/28/2024 04:51:43 AM TECHNIQUE: CTA of the chest was performed after the administration of intravenous contrast. Multiplanar reformatted images are provided for review. MIP images are provided for review. Automated exposure control, iterative reconstruction, and/or weight based adjustment of the mA/kV was utilized to reduce the radiation dose to as low as reasonably achievable. COMPARISON: None available. CLINICAL HISTORY: Pulmonary embolism (PE) suspected, high probability. FINDINGS: PULMONARY ARTERIES: Pulmonary arteries are adequately opacified for evaluation. No CT evidence for acute pulmonary embolus. Main pulmonary artery is normal in caliber. MEDIASTINUM: The heart and pericardium demonstrate no acute abnormality. There is no acute abnormality of the thoracic aorta. LYMPH NODES: No mediastinal, hilar or axillary lymphadenopathy. LUNGS AND PLEURA: 6 mm right lower lobe paraspinal nodule identified on image 64/series 4. No focal consolidation or pulmonary edema. No pleural effusion or pneumothorax. UPPER ABDOMEN: Limited images of the upper abdomen are unremarkable. SOFT TISSUES AND BONES: No acute bone or soft tissue abnormality. IMPRESSION: 1. No CT evidence for acute pulmonary embolus. 2. 6 millimeter right lower lobe paraspinal pulmonary nodule . Follow-up CT chest in 3 months recommended. Electronically signed by: Camellia Candle MD 01/28/2024 05:06 AM EST RP Workstation: [id number]   DG Abdomen 1  View Result Date: 01/28/2024 EXAM: 1 VIEW XRAY OF THE ABDOMEN 01/28/2024 02:47:23 AM COMPARISON: xr abd 09/01/05 CT abdomen and pelvis 06/10/20 CLINICAL HISTORY: Eval for e/o ureterolithiasis FINDINGS: BOWEL: Nonobstructive bowel gas pattern. SOFT TISSUES: No abnormal calcifications. Limited evaluation due to overlapping osseous structures and overlying soft tissues. BONES: No acute fracture. IMPRESSION: 1. No significant abnormality. Electronically signed by: Morgane Naveau MD 01/28/2024 02:49 AM EST RP Workstation: [id number]    Procedures Procedures (including critical care time)  Medications Ordered in UC Medications  ketorolac  (TORADOL ) 30 MG/ML injection 30 mg (30 mg Intramuscular Given 01/29/24 1018)    Initial Impression / Assessment and Plan / UC Course  I have reviewed the triage vital signs and the nursing notes.  Pertinent labs & imaging results that were available during my care of the patient were reviewed by me and considered in my medical decision making (see chart for details).     Notes and imaging from ED reviewed.  Workup in ED did not show kidney stone, aortic dissection, pulmonary embolism, pneumonia, or need for thorax. Pain reproduced to very light palpation of right sided thoracic and lumbar paraspinal muscles.  Pain also reproduced when patient  placed in supine position for abdominal exam, returning to seated position, and passive extension of the right leg.  Full strength and range of motion of bilateral lower extremities. Given absence of other symptoms, negative ED workup, inability to reproduce pain with light palpation or range of motion, and diagnosing the patient with lumbago without sciatica as well as muscle spasms of the right lumbar paraspinals. At patient request will be prescribed a Pred pack as this was helpful for her upper back pain on 11/23.  I am also increasing the patient's current prescription for methocarbamol  from 500 mg to 750 mg 4 times daily as  needed for muscle spasms.  Injection of Toradol  given in office for pain.  If desired, she may resume oral NSAID use in 12 hours. Advised patient to return should she experience abdominal pain, numbness, tingling, weakness of lower extremities, saddle paresthesia, new onset bowel or bladder incontinence, chest pain, or sudden worsening of quality of pain. Final Clinical Impressions(s) / UC Diagnoses   Final diagnoses:  Spasm of muscle of lower back     Discharge Instructions       You have a history of lumbar spinal stenosis and sciatica. You are currently experiencing a new type of back pain, which is different from your usual symptoms. This pain is mainly in your right lower back, with significant muscle tenderness and spasms, especially when you move or touch the area.  Recent Testing  You recently visited the Emergency Department, where you had several tests, including:  - CT Angiogram - Abdominal CT scan - X-ray - Ultrasound  All of these tests were negative, meaning there was no evidence of serious problems such as blood clots, fractures, or organ issues. This is reassuring and supports that your pain is most likely due to a musculoskeletal cause (muscle spasm and inflammation).  Your Treatment Plan 1. Toradol  (Ketorolac ) Injection: You received a dose of Toradol  in the office today to help reduce pain and inflammation.  2. NSAIDs (Non-Steroidal Anti-Inflammatory Drugs): You should wait at least 24 hours after your Toradol  injection before resuming any oral NSAIDs (such as ibuprofen  or naproxen ). This helps prevent side effects from overlapping medications.  3. Methylprednisolone  Dose Pack: You will start a steroid pack to help reduce inflammation. Take as directed: - Day 1: 6 tablets in the morning - Day 2: 5 tablets in the morning - Day 3: 4 tablets in the morning - Day 4: 3 tablets in the morning - Day 5: 2 tablets in the morning - Day 6: 1 tablet in the morning  4.  Methocarbamol  (Muscle Relaxant): Your dose has been increased to 750 mg four times a day (QID). This will help with muscle spasms.  Activity and Self-Care - Rest as needed, but try to keep moving gently to avoid stiffness. - Apply heat or cold to the affected area for comfort. - Avoid heavy lifting, twisting, or strenuous activity until your pain improves.  When to Seek Medical Care Contact your healthcare provider or seek immediate care if you experience: - New or worsening numbness, tingling, or weakness in your legs - Loss of bladder or bowel control - Severe, unrelenting pain not relieved by medication - Fever, chills, or signs of infection - Any new symptoms that concern you   Your recent emergency room tests were all negative for serious causes of back pain. Your current symptoms are most likely due to muscle spasm and inflammation, which can be very painful but are usually temporary and  improve with the treatment plan above.     ED Prescriptions     Medication Sig Dispense Auth. Provider   methylPREDNISolone  (MEDROL  DOSEPAK) 4 MG TBPK tablet Day 1: take 6 tabs in the morning; Day 2: take 5 tabs in the morning, decreasing by one pill each day until complete 21 tablet Leatrice Vernell HERO, NP   methocarbamol  (ROBAXIN ) 750 MG tablet Take 1 tablet (750 mg total) by mouth 4 (four) times daily as needed for muscle spasms. 28 tablet Leatrice Vernell HERO, NP      PDMP not reviewed this encounter.   Leatrice Vernell HERO, NP 01/29/24 1020

## 2024-01-29 NOTE — ED Provider Notes (Signed)
 [facility] AT [facility] Provider Note   CSN: [id number] Arrival date & time: 01/28/24  0118     Patient presents with: Flank Pain   Michele Trevino is a 48 y.o. female.   The patient is a woman who presents with right-sided flank/lateral back pain that began yesterday. Pain was initially thought to be gas-related; massage, OTC gas remedies, and an enema provided no relief. She took OxyContin  earlier today without improvement and took PMs last night to sleep. Pain is localized to the right side above the waist; no reported rash (skin was only red from rubbing). Associated symptoms include nausea without vomiting, constipation (BM earlier this morning and again after enema), and urinary frequency without dysuria, discoloration, or malodor. She denies fever, vaginal bleeding, or discharge, and denies trauma. No prior history of kidney stones or urinary tract infections. Past history notable for spinal stenosis with typical pain in the buttock region; current pain is different and lateral. History obtained from the patient and her husband.     Flank Pain       Prior to Admission medications   Medication Sig Start Date End Date Taking? Authorizing Provider  diazepam  (VALIUM ) 5 MG tablet Take 0.5 tablets (2.5 mg total) by mouth every 6 (six) hours as needed for muscle spasms (spasms). 01/28/24  Yes Ahlivia Salahuddin, Selinda, MD  oxyCODONE -acetaminophen  (PERCOCET) 5-325 MG tablet Take 2 tablets by mouth every 4 (four) hours as needed. 01/28/24  Yes Janeli Lewison, Selinda, MD  pantoprazole  (PROTONIX ) 20 MG tablet Take 1 tablet (20 mg total) by mouth daily for 7 days. 01/28/24 02/04/24 Yes Annalysse Shoemaker, Selinda, MD  albuterol  (PROVENTIL ) (2.5 MG/3ML) 0.083% nebulizer solution Take 3 mLs (2.5 mg total) by nebulization every 6 (six) hours as needed for wheezing or shortness of breath. 07/02/19 11/22/22  Brien Belvie BRAVO, MD  albuterol  (VENTOLIN  HFA) [phone] Base) MCG/ACT inhaler Inhale  1-2 puffs into the lungs every 6 (six) hours as needed for wheezing or shortness of breath. 05/18/20   Celestia Rosaline SQUIBB, NP  AMBULATORY NON FORMULARY MEDICATION [employer] [place] 1 capsule by mouth daily    [provider]  AMBULATORY NON FORMULARY MEDICATION URO vaginal probiotic 1 capsule by mouth daily    [provider]  cetirizine -pseudoephedrine  (ZYRTEC -D ALLERGY & CONGESTION) 5-120 MG tablet Take 1 tablet by mouth 2 (two) times daily. 09/14/20   Celestia Rosaline SQUIBB, NP  Cholecalciferol (VITAMIN D -3 PO) Take 1 capsule by mouth daily.    [provider]  COLLAGEN PO Take 1 capsule by mouth daily.    [provider]  Cyanocobalamin (VITAMIN B 12 PO) Take 1 tablet by mouth daily.    [provider]  ferrous sulfate 325 (65 FE) MG EC tablet Take 325 mg by mouth daily.    [provider]  fluticasone  (FLONASE ) 50 MCG/ACT nasal spray Place 2 sprays into both nostrils daily. 09/14/20   Celestia Rosaline SQUIBB, NP  hydrocortisone  (ANUSOL -HC) 25 MG suppository Place 1 suppository (25 mg total) rectally at bedtime. Nightly for 4 nights in a row use as needed 02/01/23 02/01/24  Mansouraty, Gabriel Jr., MD  Multiple Vitamin (MULTIVITAMIN) capsule Take 1 capsule by mouth daily.    [provider]  sucralfate  (CARAFATE ) 1 GM/10ML suspension Take 10 mLs (1 g total) by mouth 2 (two) times daily. 02/01/23 02/01/24  Mansouraty, Aloha Raddle., MD  Vitamin D , Ergocalciferol , (DRISDOL) 1.25 MG (50000 UNIT) CAPS capsule Take 1 capsule every week by oral route for 90 days.  [provider]    Allergies: Latex    Review of Systems  Genitourinary:  Positive for flank pain.    Updated Vital Signs BP (!) 164/90   Pulse (!) 57   Temp 98.3 F (36.8 C) (Oral)   Resp 13   Ht 5' 1 (1.549 m)   Wt 95.3 kg   LMP 08/21/2022   SpO2 95%   BMI 39.68 kg/m   Physical Exam Vitals and nursing note reviewed.  Constitutional:      Appearance: She is  well-developed.  HENT:     Head: Normocephalic and atraumatic.  Cardiovascular:     Rate and Rhythm: Normal rate and regular rhythm.  Pulmonary:     Effort: No respiratory distress.     Breath sounds: No stridor.  Abdominal:     General: There is no distension.  Musculoskeletal:        General: Tenderness (right lower lateral ribs) present.     Cervical back: Normal range of motion.  Skin:    Findings: No rash.  Neurological:     Mental Status: She is alert.     (all labs ordered are listed, but only abnormal results are displayed) Labs Reviewed  BASIC METABOLIC PANEL WITH GFR - Abnormal; Notable for the following components:      Result Value   Glucose, Bld 103 (*)    All other components within normal limits  CBC - Abnormal; Notable for the following components:   WBC 11.7 (*)    Hemoglobin 11.5 (*)    HCT 35.8 (*)    Platelets 416 (*)    All other components within normal limits  URINALYSIS, W/ REFLEX TO CULTURE (INFECTION SUSPECTED) - Abnormal; Notable for the following components:   Color, Urine COLORLESS (*)    Crystals PRESENT (*)    All other components within normal limits  HEPATIC FUNCTION PANEL - Abnormal; Notable for the following components:   Total Protein 8.3 (*)    All other components within normal limits  PREGNANCY, URINE  TROPONIN T, HIGH SENSITIVITY    EKG: None  Radiology: US  Abdomen Limited RUQ (LIVER/GB) Result Date: 01/28/2024 CLINICAL DATA:  Right upper quadrant pain. EXAM: ULTRASOUND ABDOMEN LIMITED RIGHT UPPER QUADRANT COMPARISON:  CT scan earlier same day FINDINGS: Gallbladder: No gallstones or wall thickening visualized. No sonographic Murphy sign noted by sonographer. Common bile duct: Diameter: Liver: No focal lesion identified. Within normal limits in parenchymal echogenicity. Portal vein is patent on color Doppler imaging with normal direction of blood flow towards the liver. Other: None. IMPRESSION: Unremarkable right upper quadrant  ultrasound. Electronically Signed   By: Camellia Candle M.D.   On: 01/28/2024 06:22   CT ABDOMEN PELVIS W CONTRAST Result Date: 01/28/2024 EXAM: CT ABDOMEN AND PELVIS WITH CONTRAST 01/28/2024 04:51:43 AM TECHNIQUE: CT of the abdomen and pelvis was performed with the administration of 100 mL of iohexol  (OMNIPAQUE ) 350 MG/ML injection. Multiplanar reformatted images are provided for review. Automated exposure control, iterative reconstruction, and/or weight-based adjustment of the mA/kV was utilized to reduce the radiation dose to as low as reasonably achievable. COMPARISON: 06/10/2020 and 04/12/2020. CLINICAL HISTORY: Abdominal pain, acute, nonlocalized. FINDINGS: LOWER CHEST: Please see the report for the chest CTA performed at the same time and dictated separately. LIVER: Focal low-density in the liver parenchyma adjacent to the falciform ligament is in a characteristic location for perfusion anomaly or focal fatty deposition. GALLBLADDER AND BILE DUCTS: Gallbladder is unremarkable. No biliary ductal dilatation. SPLEEN: No acute abnormality.  PANCREAS: No acute abnormality. ADRENAL GLANDS: No acute abnormality. KIDNEYS, URETERS AND BLADDER: No stones in the kidneys or ureters. No hydronephrosis. No perinephric or periureteral stranding. Urinary bladder is unremarkable. GI AND BOWEL: Stomach demonstrates no acute abnormality. The terminal ileum has normal imaging features. The appendix is unremarkable. There is no bowel obstruction. PERITONEUM AND RETROPERITONEUM: No ascites. No free air. VASCULATURE: Aorta is normal in caliber. LYMPH NODES: No lymphadenopathy. REPRODUCTIVE ORGANS: No acute abnormality. BONES AND SOFT TISSUES: No acute osseous abnormality. No focal soft tissue abnormality. IMPRESSION: 1. No acute abdominopelvic pathology identified. Electronically signed by: Camellia Candle MD 01/28/2024 05:23 AM EST RP Workstation: [id number]   CT Angio Chest PE W and/or Wo Contrast Result Date: 01/28/2024 EXAM:  CTA of the Chest with contrast for PE 01/28/2024 04:51:43 AM TECHNIQUE: CTA of the chest was performed after the administration of intravenous contrast. Multiplanar reformatted images are provided for review. MIP images are provided for review. Automated exposure control, iterative reconstruction, and/or weight based adjustment of the mA/kV was utilized to reduce the radiation dose to as low as reasonably achievable. COMPARISON: None available. CLINICAL HISTORY: Pulmonary embolism (PE) suspected, high probability. FINDINGS: PULMONARY ARTERIES: Pulmonary arteries are adequately opacified for evaluation. No CT evidence for acute pulmonary embolus. Main pulmonary artery is normal in caliber. MEDIASTINUM: The heart and pericardium demonstrate no acute abnormality. There is no acute abnormality of the thoracic aorta. LYMPH NODES: No mediastinal, hilar or axillary lymphadenopathy. LUNGS AND PLEURA: 6 mm right lower lobe paraspinal nodule identified on image 64/series 4. No focal consolidation or pulmonary edema. No pleural effusion or pneumothorax. UPPER ABDOMEN: Limited images of the upper abdomen are unremarkable. SOFT TISSUES AND BONES: No acute bone or soft tissue abnormality. IMPRESSION: 1. No CT evidence for acute pulmonary embolus. 2. 6 millimeter right lower lobe paraspinal pulmonary nodule . Follow-up CT chest in 3 months recommended. Electronically signed by: Camellia Candle MD 01/28/2024 05:06 AM EST RP Workstation: [id number]   DG Abdomen 1 View Result Date: 01/28/2024 EXAM: 1 VIEW XRAY OF THE ABDOMEN 01/28/2024 02:47:23 AM COMPARISON: xr abd 09/01/05 CT abdomen and pelvis 06/10/20 CLINICAL HISTORY: Eval for e/o ureterolithiasis FINDINGS: BOWEL: Nonobstructive bowel gas pattern. SOFT TISSUES: No abnormal calcifications. Limited evaluation due to overlapping osseous structures and overlying soft tissues. BONES: No acute fracture. IMPRESSION: 1. No significant abnormality. Electronically signed by: Morgane Naveau  MD 01/28/2024 02:49 AM EST RP Workstation: [id number]     Procedures   Medications Ordered in the ED  HYDROmorphone  (DILAUDID ) injection 1 mg (1 mg Intravenous Given 01/28/24 0204)  ondansetron  (ZOFRAN ) injection 4 mg (4 mg Intravenous Given 01/28/24 0201)  HYDROmorphone  (DILAUDID ) injection 1 mg (1 mg Intravenous Given 01/28/24 0358)  pantoprazole  (PROTONIX ) injection 40 mg (40 mg Intravenous Given 01/28/24 0358)  iohexol  (OMNIPAQUE ) 350 MG/ML injection 100 mL (100 mLs Intravenous Contrast Given 01/28/24 0430)  ondansetron  (ZOFRAN ) injection 4 mg (4 mg Intravenous Given 01/28/24 0526)  famotidine  (PEPCID ) IVPB 20 mg premix (0 mg Intravenous Stopped 01/28/24 0604)  HYDROmorphone  (DILAUDID ) injection 1 mg (1 mg Intravenous Given 01/28/24 9374)                                    Medical Decision Making Amount and/or Complexity of Data Reviewed Labs: ordered. Radiology: ordered. ECG/medicine tests: ordered.  Risk Prescription drug management.   Patient with persistent pain despite multiple rounds of IV pain medications of multiple mechanisms.  CT  scan reassuring for any acute etiology, labs reassuring no evidence of kidney stone, dissection, PE, pneumonia, pneumothorax or any other emergent etiology.   Offered admission for pain control possible GI consult versus MRIs of her spine to rule out any spinal pathology.  Patient did not want to be admitted to the hospital.  After further discussion and time to talk to her husband, did not want to be admitted.  I discussed reasons to return to the ER versus PCP or to return for reconsideration of admission.  Patient given ample time but ultimately decided to go home and was discharged in stable condition but still with some pain.   Final diagnoses:  Right flank pain    ED Discharge Orders          Ordered    oxyCODONE -acetaminophen  (PERCOCET) 5-325 MG tablet  Every 4 hours PRN        01/28/24 0658    diazepam  (VALIUM ) 5 MG tablet  Every 6  hours PRN        01/28/24 0658    pantoprazole  (PROTONIX ) 20 MG tablet  Daily        01/28/24 0658               Wiatt Mahabir, Selinda, MD 01/29/24 [phone]

## 2024-01-29 NOTE — Discharge Instructions (Addendum)
  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 You are currently experiencing a new type of back pain, which is different from your usual symptoms. This pain is mainly in your right lower back, with significant muscle tenderness and spasms, especially when you move or touch the area.  Recent Testing You recently visited the Emergency Department, where you had several tests, including: - CT Angiogram - Abdominal CT scan - X-ray - Ultrasound  All of these tests were negative, meaning there was no evidence of serious problems such as blood clots, fractures, or organ issues. This is reassuring and supports that your pain is most likely due to a musculoskeletal cause (muscle spasm and inflammation).  Your Treatment Plan 1. Toradol  (Ketorolac ) Injection: You received a dose of Toradol  in the office today to help reduce pain and inflammation.  2. NSAIDs (Non-Steroidal Anti-Inflammatory Drugs): You should wait at least 24 hours after your Toradol  injection before resuming any oral NSAIDs (such as ibuprofen  or naproxen ). This helps prevent side effects from overlapping medications.  3. Methylprednisolone  Dose Pack: You will start a steroid pack to help reduce inflammation. Take as directed: - Day 1: 6 tablets in the morning - Day 2: 5 tablets in the morning - Day 3: 4 tablets in the morning - Day 4: 3 tablets in the morning - Day 5: 2 tablets in the morning - Day 6: 1 tablet in the morning  4. Methocarbamol  (Muscle Relaxant): Your dose has been increased to 750 mg four times a day (QID). This will help with muscle spasms.  Activity and Self-Care - Rest as needed, but try to keep moving gently to avoid stiffness. - Apply heat or cold to the affected area for comfort. - Avoid heavy lifting, twisting, or strenuous activity until your pain improves.  When to Seek Medical Care Contact your healthcare provider or seek immediate care if you experience: - New or worsening numbness,  tingling, or weakness in your legs - Loss of bladder or bowel control - Severe, unrelenting pain not relieved by medication - Fever, chills, or signs of infection - Any new symptoms that concern you   Your recent emergency room tests were all negative for serious causes of back pain. Your current symptoms are most likely due to muscle spasm and inflammation, which can be very painful but are usually temporary and improve with the treatment plan above.

## 2024-03-19 ENCOUNTER — Telehealth (INDEPENDENT_AMBULATORY_CARE_PROVIDER_SITE_OTHER): Payer: Self-pay | Admitting: Primary Care

## 2024-03-19 NOTE — Telephone Encounter (Signed)
 Spoke to pt about upcoming appt.. Will be present

## 2024-03-20 ENCOUNTER — Encounter (INDEPENDENT_AMBULATORY_CARE_PROVIDER_SITE_OTHER): Payer: Self-pay | Admitting: Primary Care

## 2024-03-20 NOTE — Progress Notes (Signed)
 Patient reschedule due insurance not effective

## 2024-03-27 ENCOUNTER — Emergency Department (HOSPITAL_COMMUNITY): Payer: Self-pay

## 2024-03-27 ENCOUNTER — Other Ambulatory Visit: Payer: Self-pay

## 2024-03-27 ENCOUNTER — Encounter (HOSPITAL_COMMUNITY): Payer: Self-pay | Admitting: Emergency Medicine

## 2024-03-27 ENCOUNTER — Emergency Department (HOSPITAL_COMMUNITY)
Admission: EM | Admit: 2024-03-27 | Discharge: 2024-03-27 | Disposition: A | Discharge status: Home / Self Care | Payer: Self-pay | Source: Home / Self Care | Attending: Emergency Medicine | Admitting: Emergency Medicine

## 2024-03-27 DIAGNOSIS — S39012A Strain of muscle, fascia and tendon of lower back, initial encounter: Secondary | ICD-10-CM

## 2024-03-27 LAB — COMPREHENSIVE METABOLIC PANEL WITH GFR
ALT: 28 U/L (ref 0–44)
AST: 30 U/L (ref 15–41)
Albumin: 4.5 g/dL (ref 3.5–5.0)
Alkaline Phosphatase: 77 U/L (ref 38–126)
Anion gap: 10 (ref 5–15)
BUN: 8 mg/dL (ref 6–20)
CO2: 29 mmol/L (ref 22–32)
Calcium: 9.9 mg/dL (ref 8.9–10.3)
Chloride: 102 mmol/L (ref 98–111)
Creatinine, Ser: 0.87 mg/dL (ref 0.44–1.00)
GFR, Estimated: >60 mL/min
Glucose, Bld: 95 mg/dL (ref 70–99)
Potassium: 4.1 mmol/L (ref 3.5–5.1)
Sodium: 140 mmol/L (ref 135–145)
Total Bilirubin: 0.2 mg/dL (ref 0.0–1.2)
Total Protein: 7.9 g/dL (ref 6.5–8.1)

## 2024-03-27 LAB — CBC WITH DIFFERENTIAL/PLATELET
Abs Immature Granulocytes: 0.03 10*3/uL (ref 0.00–0.07)
Basophils Absolute: 0 10*3/uL (ref 0.0–0.1)
Basophils Relative: 0 %
Eosinophils Absolute: 0.3 10*3/uL (ref 0.0–0.5)
Eosinophils Relative: 3 %
HCT: 38.6 % (ref 36.0–46.0)
Hemoglobin: 12.3 g/dL (ref 12.0–15.0)
Immature Granulocytes: 0 %
Lymphocytes Relative: 44 %
Lymphs Abs: 4.7 10*3/uL — ABNORMAL HIGH (ref 0.7–4.0)
MCH: 27.6 pg (ref 26.0–34.0)
MCHC: 31.9 g/dL (ref 30.0–36.0)
MCV: 86.5 fL (ref 80.0–100.0)
Monocytes Absolute: 0.6 10*3/uL (ref 0.1–1.0)
Monocytes Relative: 6 %
Neutro Abs: 5 10*3/uL (ref 1.7–7.7)
Neutrophils Relative %: 47 %
Platelets: 446 10*3/uL — ABNORMAL HIGH (ref 150–400)
RBC: 4.46 MIL/uL (ref 3.87–5.11)
RDW: 14.3 % (ref 11.5–15.5)
Smear Review: NORMAL
WBC: 10.6 10*3/uL — ABNORMAL HIGH (ref 4.0–10.5)
nRBC: 0 % (ref 0.0–0.2)

## 2024-03-27 LAB — URINALYSIS, ROUTINE W REFLEX MICROSCOPIC
Bilirubin Urine: NEGATIVE
Glucose, UA: NEGATIVE mg/dL
Hgb urine dipstick: NEGATIVE
Ketones, ur: NEGATIVE mg/dL
Leukocytes,Ua: NEGATIVE
Nitrite: NEGATIVE
Protein, ur: NEGATIVE mg/dL
Specific Gravity, Urine: 1.01 (ref 1.005–1.030)
pH: 7 (ref 5.0–8.0)

## 2024-03-27 LAB — LIPASE, BLOOD: Lipase: 21 U/L (ref 11–51)

## 2024-03-27 MED ORDER — LIDOCAINE 5 % EX PTCH: 1.0000 | MEDICATED_PATCH | CUTANEOUS | 0 refills | Status: AC

## 2024-03-27 MED ORDER — CYCLOBENZAPRINE HCL 10 MG PO TABS
10.0000 mg | ORAL_TABLET | Freq: Once | ORAL | Status: AC
  Administered 2024-03-27: 10 mg via ORAL
  Filled 2024-03-27: qty 1

## 2024-03-27 MED ORDER — DIPHENHYDRAMINE HCL 25 MG PO CAPS
25.0000 mg | ORAL_CAPSULE | Freq: Once | ORAL | Status: AC
  Administered 2024-03-27: 25 mg via ORAL
  Filled 2024-03-27: qty 1

## 2024-03-27 MED ORDER — KETOROLAC TROMETHAMINE 30 MG/ML IJ SOLN
30.0000 mg | Freq: Once | INTRAMUSCULAR | Status: AC
  Administered 2024-03-27: 30 mg via INTRAMUSCULAR
  Filled 2024-03-27: qty 1

## 2024-03-27 MED ORDER — CYCLOBENZAPRINE HCL 10 MG PO TABS: 10.0000 mg | ORAL_TABLET | Freq: Every evening | ORAL | 0 refills | Status: AC | PRN

## 2024-03-27 MED ORDER — LIDOCAINE 5 % EX PTCH
2.0000 | MEDICATED_PATCH | CUTANEOUS | Status: DC
  Administered 2024-03-27: 2 via TRANSDERMAL
  Filled 2024-03-27: qty 2

## 2024-03-27 NOTE — ED Provider Triage Note (Signed)
 Emergency Medicine Provider Triage Evaluation Note  Michele Trevino , a 49 y.o. female  was evaluated in triage.  Pt complains of R lower back pain that started yesterday. Pain comes and goes. Pain worse with movement. No dysuria or hematuria. No fevers. No falls or injuries  Review of Systems  Positive: As above Negative: As above  Physical Exam  BP (!) 149/106   Pulse 76   Temp 98.4 F (36.9 C) (Oral)   Resp 19   LMP 08/21/2022   SpO2 100%  Gen:   Awake, no distress, pain with movement Resp:  Normal effort  MSK:   Moves extremities without difficulty, tender to right lower back  Other:  No abdominal tenderness. No midline spinal tenderness  Medical Decision Making  Medically screening exam initiated at 8:08 PM.  Appropriate orders placed.  Michele Trevino was informed that the remainder of the evaluation will be completed by another provider, this initial triage assessment does not replace that evaluation, and the importance of remaining in the ED until their evaluation is complete.     Veta Palma, PA-C 03/27/24 2008

## 2024-03-27 NOTE — Discharge Instructions (Addendum)
 Your workup is reassuring today.  Your back pain seems secondary to a strain of the muscles in your lower back and buttock area.  Your kidney, liver, and pancreas labs were normal.  Your urine did not show any signs of infection.  Your CT scan did not show any abnormalities to explain your pain.  Specifically, we did not see any kidney stones.  Please engage in light physical activity (like walking) to prevent your back pain from worsening and to prevent stiffness. Refrain from bedrest which can make your pain worse.   You may use up to 600mg  ibuprofen  every 6 hours as needed for pain.  Do not exceed 2.4g of ibuprofen  per day.    You may take up to 1000mg  of tylenol  every 6 hours as needed for pain.  Do not take more then 4g per day.    You may use a heating pack on your back to help with the pain.  You have been prescribed a muscle relaxer called Flexeril  (cyclobenzaprine ). You may take 0.5 - 1 tablet (5-10mg ) before bed as needed for muscle pain. This medication can be sedating. Do not drive or operate heavy machinery after taking this medicine. Do not drink alcohol or take other sedating medications when taking this medicine for safety reasons.  Keep this out of reach of small children.  You were given a dose of this medication here today.  Do not take this medication if you are also taking your methocarbamol  (Robaxin ).  May take one or the other.  You have been prescribed lidocaine  patched to help with pain. You may apply one patch to your back for up to 12 hours at a time. Then, you must remove the patch for a full 12 hours before re-applying a new patch.    Please contact your PCP for a PT referral, or you can call physical therapy office of your choice to start physical therapy.   Return to the ER if you have loss of bowel or bladder control, you develop fever, you have numbness in your groin, you severe abdominal pain, or if you have any other new or concerning symptoms.

## 2024-03-27 NOTE — ED Triage Notes (Signed)
 Patient presents due to right flank pain she describes as contraction like in nature. She does not have a history of kidney stones. She has had an episode like this in the past and was alleviated by tramadol and steroids. Symptoms started yesterday.

## 2024-03-27 NOTE — ED Provider Notes (Signed)
 " [facility] AT [facility] Provider Note   CSN: [id number] Arrival date & time: 03/27/24  1811     Patient presents with: Flank Pain   Michele Trevino is a 49 y.o. female with history of spinal stenosis, presents with concern for right lower back pain and buttock pain that started earlier today.  Reports that she has had similar episodes of pain previously which she was told was muscle pain.  She reports the pain seems to come and go and is worse with movement.  Denies any injuries to this area.  Denies any hematuria, dysuria, increased frequency.  No fever or chills at home.  No nausea, vomiting, diarrhea, or constipation.    Flank Pain       Prior to Admission medications  Medication Sig Start Date End Date Taking? Authorizing Provider  cyclobenzaprine  (FLEXERIL ) 10 MG tablet Take 1 tablet (10 mg total) by mouth at bedtime as needed for muscle spasms. 03/27/24  Yes Veta Palma, PA-C  lidocaine  (LIDODERM ) 5 % Place 1 patch onto the skin daily. Remove & Discard patch within 12 hours or as directed by MD 03/27/24  Yes Veta Palma, PA-C  albuterol  (PROVENTIL ) (2.5 MG/3ML) 0.083% nebulizer solution Take 3 mLs (2.5 mg total) by nebulization every 6 (six) hours as needed for wheezing or shortness of breath. 07/02/19 11/22/22  Brien Belvie BRAVO, MD  albuterol  (VENTOLIN  HFA) 108 [phone] Base) MCG/ACT inhaler Inhale 1-2 puffs into the lungs every 6 (six) hours as needed for wheezing or shortness of breath. 05/18/20   Celestia Rosaline SQUIBB, NP  cetirizine -pseudoephedrine  (ZYRTEC -D ALLERGY & CONGESTION) 5-120 MG tablet Take 1 tablet by mouth 2 (two) times daily. 09/14/20   Celestia Rosaline SQUIBB, NP  Cholecalciferol (VITAMIN D -3 PO) Take 1 capsule by mouth daily.    [provider]  COLLAGEN PO Take 1 capsule by mouth daily.    [provider]  Cyanocobalamin (VITAMIN B 12 PO) Take 1 tablet by mouth daily.    [provider]  ferrous  sulfate 325 (65 FE) MG EC tablet Take 325 mg by mouth daily.    [provider]  fluticasone  (FLONASE ) 50 MCG/ACT nasal spray Place 2 sprays into both nostrils daily. 09/14/20   Celestia Rosaline SQUIBB, NP  Multiple Vitamin (MULTIVITAMIN) capsule Take 1 capsule by mouth daily.    [provider]    Allergies: Latex    Review of Systems  Genitourinary:  Positive for flank pain.    Updated Vital Signs BP (!) 149/106   Pulse 76   Temp 98.4 F (36.9 C) (Oral)   Resp 19   LMP 08/21/2022   SpO2 100%   Physical Exam Vitals and nursing note reviewed.  Constitutional:      General: She is not in acute distress.    Appearance: She is well-developed.  HENT:     Head: Normocephalic and atraumatic.  Eyes:     Conjunctiva/sclera: Conjunctivae normal.  Cardiovascular:     Rate and Rhythm: Normal rate and regular rhythm.     Heart sounds: No murmur heard.    Comments: 2+ dorsalis pedis pulse bilaterally Pulmonary:     Effort: Pulmonary effort is normal. No respiratory distress.     Breath sounds: Normal breath sounds.  Abdominal:     General: Bowel sounds are normal.     Comments: Abdomen is soft and nontender  Musculoskeletal:        General: No swelling.     Cervical  back: Neck supple.     Comments: Reproducible pain with palpation over the right gluteal muscle and right lower lumbar musculature  Able to move upper and lower extremities through full range of motion.  Able to ambulate, but reports increased pain with ambulation.  No tenderness of the cervical, thoracic, or lumbar spine  Skin:    General: Skin is warm and dry.     Capillary Refill: Capillary refill takes less than 2 seconds.     Comments: No overlying skin change of the back, no rash  Neurological:     Mental Status: She is alert.     Comments: 5/5 strength with resisted hip flexion and extension bilaterally, knee flexion and extension bilaterally, ankle plantarflexion and dorsiflexion  bilaterally  Symmetric and intact sensation to the lower extremities bilaterally  Psychiatric:        Mood and Affect: Mood normal.     (all labs ordered are listed, but only abnormal results are displayed) Labs Reviewed  URINALYSIS, ROUTINE W REFLEX MICROSCOPIC - Abnormal; Notable for the following components:      Result Value   APPearance HAZY (*)    All other components within normal limits  CBC WITH DIFFERENTIAL/PLATELET - Abnormal; Notable for the following components:   WBC 10.6 (*)    Platelets 446 (*)    Lymphs Abs 4.7 (*)    All other components within normal limits  COMPREHENSIVE METABOLIC PANEL WITH GFR  LIPASE, BLOOD  PREGNANCY, URINE    EKG: None  Radiology: CT Renal Stone Study Result Date: 03/27/2024 EXAM: CT ABDOMEN AND PELVIS WITHOUT CONTRAST 03/27/2024 08:57:00 PM TECHNIQUE: CT of the abdomen and pelvis was performed without the administration of intravenous contrast. Multiplanar reformatted images are provided for review. Automated exposure control, iterative reconstruction, and/or weight-based adjustment of the mA/kV was utilized to reduce the radiation dose to as low as reasonably achievable. COMPARISON: None available. CLINICAL HISTORY: Abdominal/flank pain, stone suspected; R flank pain. FINDINGS: LOWER CHEST: No acute abnormality. LIVER: The liver is unremarkable. GALLBLADDER AND BILE DUCTS: Gallbladder is unremarkable. No biliary ductal dilatation. SPLEEN: No acute abnormality. PANCREAS: No acute abnormality. ADRENAL GLANDS: No acute abnormality. KIDNEYS, URETERS AND BLADDER: No stones in the kidneys or ureters. No hydronephrosis. No perinephric or periureteral stranding. Urinary bladder is unremarkable. GI AND BOWEL: Stomach demonstrates no acute abnormality. There is no bowel obstruction. Normal appendix (image 54). PERITONEUM AND RETROPERITONEUM: No ascites. No free air. VASCULATURE: Aorta is normal in caliber. LYMPH NODES: No lymphadenopathy. REPRODUCTIVE  ORGANS: Uterus apposes the anterior abdominal wall, likely related to prior c section. BONES AND SOFT TISSUES: Mild degenerative changes of the lower thoracic spine. No acute osseous abnormality. No focal soft tissue abnormality. IMPRESSION: 1. No acute findings in the abdomen or pelvis. Electronically signed by: Pinkie Pebbles MD 03/27/2024 09:06 PM EST RP Workstation: [id number]     Procedures   Medications Ordered in the ED  lidocaine  (LIDODERM ) 5 % 2 patch (2 patches Transdermal Patch Applied 03/27/24 2110)  ketorolac  (TORADOL ) 30 MG/ML injection 30 mg (30 mg Intramuscular Given 03/27/24 2110)  diphenhydrAMINE  (BENADRYL ) capsule 25 mg (25 mg Oral Given 03/27/24 2307)  cyclobenzaprine  (FLEXERIL ) tablet 10 mg (10 mg Oral Given 03/27/24 2307)                                    Medical Decision Making Amount and/or Complexity of Data Reviewed Labs: ordered. Radiology: ordered.  Risk Prescription drug management.     Differential diagnosis includes but is not limited to Musculoskeletal pain, radiculopathy, spinal stenosis, herniated nucleus pulposis, fracture, cauda equina, epidural abscess, shingles, nephrolithiasis, UTI, Pyelonephritis   ED Course:  Upon initial evaluation, patient appears in pain, but no acute distress.  She has an elevated blood pressure upon arrival at 149/106, but otherwise normal vitals.  She is holding the right side of her back.  On exam, does have reproducible pain when I palpate over the right lower lumbar musculature and over the right gluteal muscle.  She is nontender over the cervical, thoracic, or lumbar spine.  Denies any injuries to this area.  Abdomen is soft and nontender.  Labs Ordered: I Ordered, and personally interpreted labs.  The pertinent results include:   CBC with slight leukocytosis at 10.6, otherwise within normal limits CMP within normal limits Lipase within normal limits Urinalysis without signs of infection  Imaging Studies  ordered: I ordered imaging studies including CT renal I independently visualized the imaging with scope of interpretation limited to determining acute life threatening conditions related to emergency care. Imaging showed no acute abnormalities I agree with the radiologist interpretation  Medications Given: Toradol  Lidocaine  patch  Upon re-evaluation, patient reports pain has not significantly improved with the Toradol  and lidocaine  patch given. Patient's workup is reassuring.  Urinalysis does not show any signs of infection, no urinary symptoms, doubt UTI pyelonephritis.  Labs without any elevations in LFTs, creatinine, lipase.  No leukocytosis, tachycardia, or fever to suggest sepsis. No midline spinal tenderness or injuries to suggest spinal pathology such as herniated nucleus pulposis, vertebral fracture, epidural abscess, or other. CT renal was obtained due to the right flank pain.  No signs of nephrolithiasis or ureterolithiasis on CT.  No other acute abnormalities noted in the abdomen to explain patient's pain. Given the reassuring labs and imaging, stable vitals, and reproducible pain with palpation of the musculature of the right lower back and gluteal muscle, I suspect patient's pain is musculoskeletal in nature.  Patient does request some medication to help with the pain and to help her get some sleep tonight.  Discussed that the muscle relaxer may make her drowsy and we can use this to help with pain and to help with sleep at night.  Will also give Benadryl  to help with sleep.  She would like these medications prior to going home as she does not have any of these medications at home to take.  Her husband is at bedside and will drive her home after receiving these medications.   Patient stable and appropriate for discharge home  Impression: Lumbar strain  Disposition:  Patient discharged home with instructions to take tylenol  and ibuprofen  as needed for pain. Heating packs on the back  to help with pain. Flexeril  before bed as needed for pain. Patient understands that Flexeril  may make them drowsy and they may not drink alcohol or drive/operate heavy machinery after taking this medication.  Lidocaine  patches as needed for pain.  We discussed that since this pain seems to be recurrent for the patient, she may benefit from physical therapy with her musculoskeletal pain.  Follow-up with PCP within the next 2 weeks for recheck of symptoms. Return precautions given and patient verbalized understanding.    Record Review: External records from outside source obtained and reviewed including ER visit from 01/28/2024 where she was also seen for right flank pain     This chart was dictated using voice recognition software, Dragon. Despite  the best efforts of this provider to proofread and correct errors, errors may still occur which can change documentation meaning.       Final diagnoses:  Lumbar strain, initial encounter    ED Discharge Orders          Ordered    cyclobenzaprine  (FLEXERIL ) 10 MG tablet  At bedtime PRN        03/27/24 2256    lidocaine  (LIDODERM ) 5 %  Every 24 hours        03/27/24 2256               Veta Palma, PA-C 03/27/24 2311    Kingsley, Victoria K, DO 03/27/24 2312  "
